# Patient Record
Sex: Male | Born: 1950 | Race: Black or African American | Hispanic: No | Marital: Married | Smoking: Former smoker
Health system: Southern US, Community
[De-identification: ages and names within clinical notes are randomized; demographics above are authoritative.]

## PROBLEM LIST (undated history)

## (undated) ENCOUNTER — Emergency Department (HOSPITAL_COMMUNITY): Discharge status: Absent without leave | Payer: Medicare HMO

## (undated) ENCOUNTER — Emergency Department (HOSPITAL_COMMUNITY): Discharge status: Absent without leave | Payer: Self-pay

## (undated) DIAGNOSIS — R0609 Other forms of dyspnea: Secondary | ICD-10-CM

## (undated) DIAGNOSIS — E785 Hyperlipidemia, unspecified: Secondary | ICD-10-CM

## (undated) DIAGNOSIS — I2510 Atherosclerotic heart disease of native coronary artery without angina pectoris: Secondary | ICD-10-CM

## (undated) DIAGNOSIS — Z951 Presence of aortocoronary bypass graft: Secondary | ICD-10-CM

## (undated) DIAGNOSIS — I10 Essential (primary) hypertension: Secondary | ICD-10-CM

## (undated) HISTORY — DX: Presence of aortocoronary bypass graft: Z95.1

## (undated) HISTORY — DX: Atherosclerotic heart disease of native coronary artery without angina pectoris: I25.10

## (undated) HISTORY — DX: Other forms of dyspnea: R06.09

---

## 2007-05-01 ENCOUNTER — Emergency Department (HOSPITAL_COMMUNITY): Admission: EM | Admit: 2007-05-01 | Discharge: 2007-05-01 | Payer: Self-pay | Admitting: Emergency Medicine

## 2007-08-29 ENCOUNTER — Emergency Department (HOSPITAL_COMMUNITY): Admission: EM | Admit: 2007-08-29 | Discharge: 2007-08-29 | Payer: Self-pay | Admitting: *Deleted

## 2007-09-05 ENCOUNTER — Emergency Department (HOSPITAL_COMMUNITY): Admission: EM | Admit: 2007-09-05 | Discharge: 2007-09-05 | Payer: Self-pay | Admitting: Emergency Medicine

## 2008-03-16 ENCOUNTER — Ambulatory Visit (HOSPITAL_COMMUNITY): Admission: RE | Admit: 2008-03-16 | Discharge: 2008-03-16 | Payer: Self-pay | Admitting: Family Medicine

## 2008-03-22 ENCOUNTER — Ambulatory Visit (HOSPITAL_COMMUNITY): Admission: RE | Admit: 2008-03-22 | Discharge: 2008-03-22 | Payer: Self-pay | Admitting: Family Medicine

## 2014-06-19 ENCOUNTER — Ambulatory Visit (INDEPENDENT_AMBULATORY_CARE_PROVIDER_SITE_OTHER): Payer: Self-pay | Admitting: Urology

## 2014-06-19 ENCOUNTER — Other Ambulatory Visit: Payer: Self-pay | Admitting: Urology

## 2014-06-19 DIAGNOSIS — R3129 Other microscopic hematuria: Secondary | ICD-10-CM

## 2014-06-19 DIAGNOSIS — R319 Hematuria, unspecified: Secondary | ICD-10-CM

## 2014-06-19 DIAGNOSIS — K4090 Unilateral inguinal hernia, without obstruction or gangrene, not specified as recurrent: Secondary | ICD-10-CM

## 2014-07-09 ENCOUNTER — Ambulatory Visit (HOSPITAL_COMMUNITY)
Admission: RE | Admit: 2014-07-09 | Discharge: 2014-07-09 | Disposition: A | Discharge status: Home / Self Care | Payer: Self-pay | Source: Ambulatory Visit | Attending: Urology | Admitting: Urology

## 2014-07-09 DIAGNOSIS — I700 Atherosclerosis of aorta: Secondary | ICD-10-CM | POA: Insufficient documentation

## 2014-07-09 DIAGNOSIS — K4090 Unilateral inguinal hernia, without obstruction or gangrene, not specified as recurrent: Secondary | ICD-10-CM | POA: Insufficient documentation

## 2014-07-09 DIAGNOSIS — R319 Hematuria, unspecified: Secondary | ICD-10-CM | POA: Insufficient documentation

## 2014-07-09 DIAGNOSIS — K439 Ventral hernia without obstruction or gangrene: Secondary | ICD-10-CM | POA: Insufficient documentation

## 2014-07-09 DIAGNOSIS — N400 Enlarged prostate without lower urinary tract symptoms: Secondary | ICD-10-CM | POA: Insufficient documentation

## 2014-07-09 DIAGNOSIS — K7689 Other specified diseases of liver: Secondary | ICD-10-CM | POA: Insufficient documentation

## 2014-07-09 MED ORDER — SODIUM CHLORIDE 0.9 % IV SOLN
INTRAVENOUS | Status: AC
  Filled 2014-07-09: qty 250

## 2014-07-09 MED ORDER — IOHEXOL 300 MG/ML  SOLN
125.0000 mL | Freq: Once | INTRAMUSCULAR | Status: AC | PRN
  Administered 2014-07-09: 125 mL via INTRAVENOUS

## 2014-07-24 ENCOUNTER — Ambulatory Visit (INDEPENDENT_AMBULATORY_CARE_PROVIDER_SITE_OTHER): Payer: Self-pay | Admitting: Urology

## 2014-07-24 DIAGNOSIS — R3129 Other microscopic hematuria: Secondary | ICD-10-CM

## 2018-03-01 DIAGNOSIS — Z1389 Encounter for screening for other disorder: Secondary | ICD-10-CM | POA: Diagnosis not present

## 2018-03-01 DIAGNOSIS — E782 Mixed hyperlipidemia: Secondary | ICD-10-CM | POA: Diagnosis not present

## 2018-03-01 DIAGNOSIS — Z6828 Body mass index (BMI) 28.0-28.9, adult: Secondary | ICD-10-CM | POA: Diagnosis not present

## 2018-03-01 DIAGNOSIS — E663 Overweight: Secondary | ICD-10-CM | POA: Diagnosis not present

## 2018-03-01 DIAGNOSIS — Z125 Encounter for screening for malignant neoplasm of prostate: Secondary | ICD-10-CM | POA: Diagnosis not present

## 2018-03-01 DIAGNOSIS — Z9119 Patient's noncompliance with other medical treatment and regimen: Secondary | ICD-10-CM | POA: Diagnosis not present

## 2018-03-01 DIAGNOSIS — I10 Essential (primary) hypertension: Secondary | ICD-10-CM | POA: Diagnosis not present

## 2018-03-08 DIAGNOSIS — Z719 Counseling, unspecified: Secondary | ICD-10-CM | POA: Diagnosis not present

## 2018-03-08 DIAGNOSIS — N183 Chronic kidney disease, stage 3 (moderate): Secondary | ICD-10-CM | POA: Diagnosis not present

## 2018-03-08 DIAGNOSIS — R7309 Other abnormal glucose: Secondary | ICD-10-CM | POA: Diagnosis not present

## 2018-03-08 DIAGNOSIS — Z6828 Body mass index (BMI) 28.0-28.9, adult: Secondary | ICD-10-CM | POA: Diagnosis not present

## 2018-03-08 DIAGNOSIS — E663 Overweight: Secondary | ICD-10-CM | POA: Diagnosis not present

## 2018-03-08 DIAGNOSIS — I10 Essential (primary) hypertension: Secondary | ICD-10-CM | POA: Diagnosis not present

## 2018-03-16 ENCOUNTER — Other Ambulatory Visit (HOSPITAL_COMMUNITY): Payer: Self-pay | Admitting: Family Medicine

## 2018-03-16 DIAGNOSIS — F17210 Nicotine dependence, cigarettes, uncomplicated: Secondary | ICD-10-CM

## 2018-03-17 DIAGNOSIS — F17210 Nicotine dependence, cigarettes, uncomplicated: Secondary | ICD-10-CM | POA: Diagnosis not present

## 2018-03-17 DIAGNOSIS — I700 Atherosclerosis of aorta: Secondary | ICD-10-CM | POA: Diagnosis not present

## 2018-03-17 DIAGNOSIS — I77811 Abdominal aortic ectasia: Secondary | ICD-10-CM | POA: Diagnosis not present

## 2018-03-17 DIAGNOSIS — Z136 Encounter for screening for cardiovascular disorders: Secondary | ICD-10-CM | POA: Diagnosis not present

## 2018-03-17 DIAGNOSIS — Z87891 Personal history of nicotine dependence: Secondary | ICD-10-CM | POA: Diagnosis not present

## 2018-03-17 DIAGNOSIS — Z1389 Encounter for screening for other disorder: Secondary | ICD-10-CM | POA: Diagnosis not present

## 2018-03-17 DIAGNOSIS — I10 Essential (primary) hypertension: Secondary | ICD-10-CM | POA: Diagnosis not present

## 2018-03-17 DIAGNOSIS — E782 Mixed hyperlipidemia: Secondary | ICD-10-CM | POA: Diagnosis not present

## 2018-03-17 DIAGNOSIS — Z9119 Patient's noncompliance with other medical treatment and regimen: Secondary | ICD-10-CM | POA: Diagnosis not present

## 2018-04-04 ENCOUNTER — Ambulatory Visit (HOSPITAL_COMMUNITY)
Admission: RE | Admit: 2018-04-04 | Discharge: 2018-04-04 | Disposition: A | Discharge status: Home / Self Care | Payer: Medicare Other | Source: Ambulatory Visit | Attending: Family Medicine | Admitting: Family Medicine

## 2018-04-04 DIAGNOSIS — F17210 Nicotine dependence, cigarettes, uncomplicated: Secondary | ICD-10-CM

## 2018-04-04 DIAGNOSIS — I700 Atherosclerosis of aorta: Secondary | ICD-10-CM | POA: Diagnosis not present

## 2018-04-04 DIAGNOSIS — I2510 Atherosclerotic heart disease of native coronary artery without angina pectoris: Secondary | ICD-10-CM | POA: Insufficient documentation

## 2018-04-04 DIAGNOSIS — Z122 Encounter for screening for malignant neoplasm of respiratory organs: Secondary | ICD-10-CM | POA: Insufficient documentation

## 2018-04-04 DIAGNOSIS — J432 Centrilobular emphysema: Secondary | ICD-10-CM | POA: Diagnosis not present

## 2018-04-04 DIAGNOSIS — F17200 Nicotine dependence, unspecified, uncomplicated: Secondary | ICD-10-CM | POA: Diagnosis not present

## 2018-05-30 DIAGNOSIS — E663 Overweight: Secondary | ICD-10-CM | POA: Diagnosis not present

## 2018-05-30 DIAGNOSIS — I10 Essential (primary) hypertension: Secondary | ICD-10-CM | POA: Diagnosis not present

## 2018-05-30 DIAGNOSIS — Z6828 Body mass index (BMI) 28.0-28.9, adult: Secondary | ICD-10-CM | POA: Diagnosis not present

## 2018-06-02 DIAGNOSIS — Z1211 Encounter for screening for malignant neoplasm of colon: Secondary | ICD-10-CM | POA: Diagnosis not present

## 2018-08-19 DIAGNOSIS — I10 Essential (primary) hypertension: Secondary | ICD-10-CM | POA: Diagnosis not present

## 2018-08-19 DIAGNOSIS — Z1389 Encounter for screening for other disorder: Secondary | ICD-10-CM | POA: Diagnosis not present

## 2018-08-19 DIAGNOSIS — E782 Mixed hyperlipidemia: Secondary | ICD-10-CM | POA: Diagnosis not present

## 2018-08-19 DIAGNOSIS — R7309 Other abnormal glucose: Secondary | ICD-10-CM | POA: Diagnosis not present

## 2018-08-19 DIAGNOSIS — Z0001 Encounter for general adult medical examination with abnormal findings: Secondary | ICD-10-CM | POA: Diagnosis not present

## 2018-08-19 DIAGNOSIS — E663 Overweight: Secondary | ICD-10-CM | POA: Diagnosis not present

## 2018-08-19 DIAGNOSIS — Z6827 Body mass index (BMI) 27.0-27.9, adult: Secondary | ICD-10-CM | POA: Diagnosis not present

## 2018-10-25 DIAGNOSIS — N182 Chronic kidney disease, stage 2 (mild): Secondary | ICD-10-CM | POA: Diagnosis not present

## 2018-10-25 DIAGNOSIS — Z719 Counseling, unspecified: Secondary | ICD-10-CM | POA: Diagnosis not present

## 2018-10-25 DIAGNOSIS — E663 Overweight: Secondary | ICD-10-CM | POA: Diagnosis not present

## 2018-10-25 DIAGNOSIS — E782 Mixed hyperlipidemia: Secondary | ICD-10-CM | POA: Diagnosis not present

## 2018-10-25 DIAGNOSIS — Z6828 Body mass index (BMI) 28.0-28.9, adult: Secondary | ICD-10-CM | POA: Diagnosis not present

## 2019-03-01 DIAGNOSIS — E7849 Other hyperlipidemia: Secondary | ICD-10-CM | POA: Diagnosis not present

## 2019-03-01 DIAGNOSIS — E663 Overweight: Secondary | ICD-10-CM | POA: Diagnosis not present

## 2019-03-01 DIAGNOSIS — Z1389 Encounter for screening for other disorder: Secondary | ICD-10-CM | POA: Diagnosis not present

## 2019-03-01 DIAGNOSIS — I10 Essential (primary) hypertension: Secondary | ICD-10-CM | POA: Diagnosis not present

## 2019-03-01 DIAGNOSIS — R7309 Other abnormal glucose: Secondary | ICD-10-CM | POA: Diagnosis not present

## 2019-03-01 DIAGNOSIS — Z6829 Body mass index (BMI) 29.0-29.9, adult: Secondary | ICD-10-CM | POA: Diagnosis not present

## 2019-03-02 DIAGNOSIS — E7849 Other hyperlipidemia: Secondary | ICD-10-CM | POA: Diagnosis not present

## 2019-03-02 DIAGNOSIS — I10 Essential (primary) hypertension: Secondary | ICD-10-CM | POA: Diagnosis not present

## 2019-03-02 DIAGNOSIS — Z1389 Encounter for screening for other disorder: Secondary | ICD-10-CM | POA: Diagnosis not present

## 2019-03-02 DIAGNOSIS — N182 Chronic kidney disease, stage 2 (mild): Secondary | ICD-10-CM | POA: Diagnosis not present

## 2019-03-02 DIAGNOSIS — Z125 Encounter for screening for malignant neoplasm of prostate: Secondary | ICD-10-CM | POA: Diagnosis not present

## 2019-06-07 DIAGNOSIS — I10 Essential (primary) hypertension: Secondary | ICD-10-CM | POA: Diagnosis not present

## 2019-06-07 DIAGNOSIS — E7849 Other hyperlipidemia: Secondary | ICD-10-CM | POA: Diagnosis not present

## 2019-06-07 DIAGNOSIS — R7309 Other abnormal glucose: Secondary | ICD-10-CM | POA: Diagnosis not present

## 2019-06-07 DIAGNOSIS — Z6828 Body mass index (BMI) 28.0-28.9, adult: Secondary | ICD-10-CM | POA: Diagnosis not present

## 2019-06-07 DIAGNOSIS — Z1389 Encounter for screening for other disorder: Secondary | ICD-10-CM | POA: Diagnosis not present

## 2019-06-10 DIAGNOSIS — Z1211 Encounter for screening for malignant neoplasm of colon: Secondary | ICD-10-CM | POA: Diagnosis not present

## 2019-06-12 ENCOUNTER — Other Ambulatory Visit (HOSPITAL_COMMUNITY): Payer: Self-pay | Admitting: Family Medicine

## 2019-06-12 DIAGNOSIS — R911 Solitary pulmonary nodule: Secondary | ICD-10-CM

## 2019-06-29 ENCOUNTER — Ambulatory Visit (HOSPITAL_COMMUNITY): Payer: Medicare HMO

## 2019-07-06 ENCOUNTER — Ambulatory Visit (HOSPITAL_COMMUNITY)
Admission: RE | Admit: 2019-07-06 | Discharge: 2019-07-06 | Disposition: A | Discharge status: Home / Self Care | Payer: Medicare HMO | Source: Ambulatory Visit | Attending: Family Medicine | Admitting: Family Medicine

## 2019-07-06 ENCOUNTER — Other Ambulatory Visit: Payer: Self-pay

## 2019-07-06 DIAGNOSIS — R918 Other nonspecific abnormal finding of lung field: Secondary | ICD-10-CM | POA: Diagnosis not present

## 2019-07-06 DIAGNOSIS — J432 Centrilobular emphysema: Secondary | ICD-10-CM | POA: Diagnosis not present

## 2019-07-06 DIAGNOSIS — R911 Solitary pulmonary nodule: Secondary | ICD-10-CM

## 2019-07-06 DIAGNOSIS — J449 Chronic obstructive pulmonary disease, unspecified: Secondary | ICD-10-CM | POA: Diagnosis not present

## 2019-09-22 DIAGNOSIS — I10 Essential (primary) hypertension: Secondary | ICD-10-CM | POA: Diagnosis not present

## 2019-09-22 DIAGNOSIS — K4090 Unilateral inguinal hernia, without obstruction or gangrene, not specified as recurrent: Secondary | ICD-10-CM | POA: Diagnosis not present

## 2019-09-22 DIAGNOSIS — E663 Overweight: Secondary | ICD-10-CM | POA: Diagnosis not present

## 2019-09-22 DIAGNOSIS — E7849 Other hyperlipidemia: Secondary | ICD-10-CM | POA: Diagnosis not present

## 2019-09-22 DIAGNOSIS — Z6827 Body mass index (BMI) 27.0-27.9, adult: Secondary | ICD-10-CM | POA: Diagnosis not present

## 2019-09-22 DIAGNOSIS — Z1389 Encounter for screening for other disorder: Secondary | ICD-10-CM | POA: Diagnosis not present

## 2019-09-22 DIAGNOSIS — Z0000 Encounter for general adult medical examination without abnormal findings: Secondary | ICD-10-CM | POA: Diagnosis not present

## 2019-09-25 ENCOUNTER — Other Ambulatory Visit: Payer: Self-pay

## 2019-09-25 DIAGNOSIS — N183 Chronic kidney disease, stage 3 unspecified: Secondary | ICD-10-CM | POA: Insufficient documentation

## 2019-09-25 DIAGNOSIS — R7309 Other abnormal glucose: Secondary | ICD-10-CM | POA: Insufficient documentation

## 2019-09-25 DIAGNOSIS — E7849 Other hyperlipidemia: Secondary | ICD-10-CM | POA: Insufficient documentation

## 2019-09-25 DIAGNOSIS — E782 Mixed hyperlipidemia: Secondary | ICD-10-CM | POA: Insufficient documentation

## 2019-09-25 DIAGNOSIS — I10 Essential (primary) hypertension: Secondary | ICD-10-CM | POA: Insufficient documentation

## 2019-10-26 ENCOUNTER — Ambulatory Visit: Payer: Medicare HMO | Admitting: General Surgery

## 2019-10-26 ENCOUNTER — Encounter: Payer: Self-pay | Admitting: General Surgery

## 2019-10-26 ENCOUNTER — Other Ambulatory Visit: Payer: Self-pay

## 2019-10-26 VITALS — BP 148/94 | HR 90 | Temp 97.4°F | Resp 16 | Ht 72.0 in | Wt 195.0 lb

## 2019-10-26 DIAGNOSIS — K4091 Unilateral inguinal hernia, without obstruction or gangrene, recurrent: Secondary | ICD-10-CM

## 2019-10-26 DIAGNOSIS — K4090 Unilateral inguinal hernia, without obstruction or gangrene, not specified as recurrent: Secondary | ICD-10-CM

## 2019-10-26 NOTE — Progress Notes (Signed)
Dean Mendez; [id number]; 1951-04-05   HPI Patient is a 68 year old black male who was referred to my care by Dr. Hilma Favors for evaluation and treatment of a left inguinal hernia.  Patient states he had the hernia fixed at the age of 25.  He has had over the last 2 decades increasing swelling in the left groin region.  Is made worse with coughing.  Over the last 9 years, he states he has had significant swelling in the left inguinal region.  Is starting to become more uncomfortable.  He denies any nausea or vomiting.  He currently has 0 out of 10 abdominal pain. History reviewed. No pertinent past medical history.  History reviewed. No pertinent surgical history.  History reviewed. No pertinent family history.  Current Outpatient Medications on File Prior to Visit  Medication Sig Dispense Refill  . CARTIA XT 300 MG 24 hr capsule Take 300 mg by mouth daily.    . chlorhexidine (PERIDEX) 0.12 % solution 15 mLs 2 (two) times daily.    Marland Kitchen HYDROcodone-acetaminophen (NORCO/VICODIN) 5-325 MG tablet Take 1 tablet by mouth every 4 (four) hours as needed.    Marland Kitchen ibuprofen (ADVIL) 400 MG tablet Take 400 mg by mouth every 6 (six) hours as needed.    Marland Kitchen lisinopril-hydrochlorothiazide (ZESTORETIC) 20-12.5 MG tablet Take 1 tablet by mouth daily.    . pravastatin (PRAVACHOL) 20 MG tablet Take 20 mg by mouth daily.     No current facility-administered medications on file prior to visit.     Not on File  Social History   Substance and Sexual Activity  Alcohol Use Not Currently    Social History   Tobacco Use  Smoking Status Current Every Day Smoker  . Packs/day: 0.25  . Types: Cigarettes  Smokeless Tobacco Never Used    Review of Systems  Constitutional: Negative.   HENT: Negative.   Eyes: Negative.   Respiratory: Negative.   Cardiovascular: Negative.   Gastrointestinal: Negative.   Genitourinary: Negative.   Musculoskeletal: Negative.   Skin: Negative.   Neurological: Negative.    Endo/Heme/Allergies: Negative.   Psychiatric/Behavioral: Negative.     Objective   Vitals:   10/26/19 0920  BP: (!) 148/94  Pulse: 90  Resp: 16  Temp: (!) 97.4 F (36.3 C)  SpO2: 96%    Physical Exam Vitals signs reviewed.  Constitutional:      Appearance: Normal appearance. He is normal weight. He is not ill-appearing.  HENT:     Head: Normocephalic and atraumatic.  Cardiovascular:     Rate and Rhythm: Normal rate and regular rhythm.     Heart sounds: Normal heart sounds. No murmur. No friction rub. No gallop.   Pulmonary:     Effort: Pulmonary effort is normal. No respiratory distress.     Breath sounds: Normal breath sounds. No stridor. No wheezing, rhonchi or rales.  Abdominal:     General: Abdomen is flat. There is no distension.     Palpations: Abdomen is soft. There is no mass.     Tenderness: There is no abdominal tenderness. There is no guarding or rebound.     Hernia: A hernia is present.     Comments: Very large left inguinal hernia with intestinal contents within the scrotum.  Smaller weak right inguinal floor.  Genitourinary:    Penis: Normal.      Scrotum/Testes: Normal.  Neurological:     General: No focal deficit present.     Mental Status: He is alert and oriented to  person, place, and time.     Assessment  Recurrent left inguinal hernia Plan   Patient will call to schedule recurrent left inguinal herniorrhaphy with mesh.  The risks and benefits of the procedure including bleeding, infection, mesh use, and the possibility of recurrence of the hernia were fully explained to the patient, who gave informed consent.

## 2019-10-26 NOTE — Patient Instructions (Signed)
Inguinal Hernia, Adult °An inguinal hernia develops when fat or the intestines push through a weak spot in a muscle where your leg meets your lower abdomen (groin). This creates a bulge. This kind of hernia could also be: °· In your scrotum, if you are male. °· In folds of skin around your vagina, if you are male. °There are three types of inguinal hernias: °· Hernias that can be pushed back into the abdomen (are reducible). This type rarely causes pain. °· Hernias that are not reducible (are incarcerated). °· Hernias that are not reducible and lose their blood supply (are strangulated). This type of hernia requires emergency surgery. °What are the causes? °This condition is caused by having a weak spot in the muscles or tissues in the groin. This weak spot develops over time. The hernia may poke through the weak spot when you suddenly strain your lower abdominal muscles, such as when you: °· Lift a heavy object. °· Strain to have a bowel movement. Constipation can lead to straining. °· Cough. °What increases the risk? °This condition is more likely to develop in: °· Men. °· Pregnant women. °· People who: °? Are overweight. °? Work in jobs that require long periods of standing or heavy lifting. °? Have had an inguinal hernia before. °? Smoke or have lung disease. These factors can lead to long-lasting (chronic) coughing. °What are the signs or symptoms? °Symptoms may depend on the size of the hernia. Often, a small inguinal hernia has no symptoms. Symptoms of a larger hernia may include: °· A lump in the groin area. This is easier to see when standing. It might not be visible when lying down. °· Pain or burning in the groin. This may get worse when lifting, straining, or coughing. °· A dull ache or a feeling of pressure in the groin. °· In men, an unusual lump in the scrotum. °Symptoms of a strangulated inguinal hernia may include: °· A bulge in your groin that is very painful and tender to the touch. °· A bulge  that turns red or purple. °· Fever, nausea, and vomiting. °· Inability to have a bowel movement or to pass gas. °How is this diagnosed? °This condition is diagnosed based on your symptoms, your medical history, and a physical exam. Your health care provider may feel your groin area and ask you to cough. °How is this treated? °Treatment depends on the size of your hernia and whether you have symptoms. If you do not have symptoms, your health care provider may have you watch your hernia carefully and have you come in for follow-up visits. If your hernia is large or if you have symptoms, you may need surgery to repair the hernia. °Follow these instructions at home: °Lifestyle °· Avoid lifting heavy objects. °· Avoid standing for long periods of time. °· Do not use any products that contain nicotine or tobacco, such as cigarettes and e-cigarettes. If you need help quitting, ask your health care provider. °· Maintain a healthy weight. °Preventing constipation °· Take actions to prevent constipation. Constipation leads to straining with bowel movements, which can make a hernia worse or cause a hernia repair to break down. Your health care provider may recommend that you: °? Drink enough fluid to keep your urine pale yellow. °? Eat foods that are high in fiber, such as fresh fruits and vegetables, whole grains, and beans. °? Limit foods that are high in fat and processed sugars, such as fried or sweet foods. °? Take an over-the-counter   or prescription medicine for constipation. °General instructions °· You may try to push the hernia back in place by very gently pressing on it while lying down. Do not try to force the bulge back in if it will not push in easily. °· Watch your hernia for any changes in shape, size, or color. Get help right away if you notice any changes. °· Take over-the-counter and prescription medicines only as told by your health care provider. °· Keep all follow-up visits as told by your health care  provider. This is important. °Contact a health care provider if: °· You have a fever. °· You develop new symptoms. °· Your symptoms get worse. °Get help right away if: °· You have pain in your groin that suddenly gets worse. °· You have a bulge in your groin that: °? Suddenly gets bigger and does not get smaller. °? Becomes red or purple or painful to the touch. °· You are a man and you have a sudden pain in your scrotum, or the size of your scrotum suddenly changes. °· You cannot push the hernia back in place by very gently pressing on it when you are lying down. Do not try to force the bulge back in if it will not push in easily. °· You have nausea or vomiting that does not go away. °· You have a fast heartbeat. °· You cannot have a bowel movement or pass gas. °These symptoms may represent a serious problem that is an emergency. Do not wait to see if the symptoms will go away. Get medical help right away. Call your local emergency services (911 in the [place] °Summary °· An inguinal hernia develops when fat or the intestines push through a weak spot in a muscle where your leg meets your lower abdomen (groin). °· This condition is caused by having a weak spot in muscles or tissue in your groin. °· Symptoms may depend on the size of the hernia, and they may include pain or swelling in your groin. A small inguinal hernia often has no symptoms. °· Treatment may not be needed if you do not have symptoms. If you have symptoms or a large hernia, you may need surgery to repair the hernia. °· Avoid lifting heavy objects. Also avoid standing for long amounts of time. °This information is not intended to replace advice given to you by your health care provider. Make sure you discuss any questions you have with your health care provider. °Document Released: 04/25/2009 Document Revised: 01/08/2018 Document Reviewed: 09/08/2017 °Elsevier Patient Education © 2020 [employer] ° °

## 2019-11-07 ENCOUNTER — Other Ambulatory Visit: Payer: Self-pay | Admitting: *Deleted

## 2019-11-07 DIAGNOSIS — Z20822 Contact with and (suspected) exposure to covid-19: Secondary | ICD-10-CM

## 2019-11-09 LAB — NOVEL CORONAVIRUS, NAA: SARS-CoV-2, NAA: NOT DETECTED

## 2019-11-10 ENCOUNTER — Telehealth: Payer: Self-pay

## 2019-11-10 NOTE — Telephone Encounter (Signed)
Patient given negative result and verbalized understanding  

## 2020-04-03 DIAGNOSIS — I951 Orthostatic hypotension: Secondary | ICD-10-CM | POA: Diagnosis not present

## 2020-04-03 DIAGNOSIS — I10 Essential (primary) hypertension: Secondary | ICD-10-CM | POA: Diagnosis not present

## 2020-04-03 DIAGNOSIS — Z7982 Long term (current) use of aspirin: Secondary | ICD-10-CM | POA: Diagnosis not present

## 2020-04-03 DIAGNOSIS — E785 Hyperlipidemia, unspecified: Secondary | ICD-10-CM | POA: Diagnosis not present

## 2020-04-03 DIAGNOSIS — R69 Illness, unspecified: Secondary | ICD-10-CM | POA: Diagnosis not present

## 2020-04-03 DIAGNOSIS — Z008 Encounter for other general examination: Secondary | ICD-10-CM | POA: Diagnosis not present

## 2020-04-03 DIAGNOSIS — Z8249 Family history of ischemic heart disease and other diseases of the circulatory system: Secondary | ICD-10-CM | POA: Diagnosis not present

## 2020-04-03 DIAGNOSIS — Z833 Family history of diabetes mellitus: Secondary | ICD-10-CM | POA: Diagnosis not present

## 2020-04-03 DIAGNOSIS — M1990 Unspecified osteoarthritis, unspecified site: Secondary | ICD-10-CM | POA: Diagnosis not present

## 2020-04-03 DIAGNOSIS — I739 Peripheral vascular disease, unspecified: Secondary | ICD-10-CM | POA: Diagnosis not present

## 2020-08-06 DIAGNOSIS — Z6827 Body mass index (BMI) 27.0-27.9, adult: Secondary | ICD-10-CM | POA: Diagnosis not present

## 2020-08-06 DIAGNOSIS — M5030 Other cervical disc degeneration, unspecified cervical region: Secondary | ICD-10-CM | POA: Diagnosis not present

## 2020-08-06 DIAGNOSIS — M542 Cervicalgia: Secondary | ICD-10-CM | POA: Diagnosis not present

## 2020-08-06 DIAGNOSIS — E663 Overweight: Secondary | ICD-10-CM | POA: Diagnosis not present

## 2020-08-06 DIAGNOSIS — Z1389 Encounter for screening for other disorder: Secondary | ICD-10-CM | POA: Diagnosis not present

## 2020-08-20 DIAGNOSIS — M79674 Pain in right toe(s): Secondary | ICD-10-CM | POA: Diagnosis not present

## 2020-08-20 DIAGNOSIS — M79675 Pain in left toe(s): Secondary | ICD-10-CM | POA: Diagnosis not present

## 2020-08-20 DIAGNOSIS — B351 Tinea unguium: Secondary | ICD-10-CM | POA: Diagnosis not present

## 2020-09-30 DIAGNOSIS — Z1389 Encounter for screening for other disorder: Secondary | ICD-10-CM | POA: Diagnosis not present

## 2020-09-30 DIAGNOSIS — I10 Essential (primary) hypertension: Secondary | ICD-10-CM | POA: Diagnosis not present

## 2020-09-30 DIAGNOSIS — N183 Chronic kidney disease, stage 3 unspecified: Secondary | ICD-10-CM | POA: Diagnosis not present

## 2020-09-30 DIAGNOSIS — Z6827 Body mass index (BMI) 27.0-27.9, adult: Secondary | ICD-10-CM | POA: Diagnosis not present

## 2020-09-30 DIAGNOSIS — M5030 Other cervical disc degeneration, unspecified cervical region: Secondary | ICD-10-CM | POA: Diagnosis not present

## 2020-09-30 DIAGNOSIS — Z1331 Encounter for screening for depression: Secondary | ICD-10-CM | POA: Diagnosis not present

## 2020-09-30 DIAGNOSIS — E7849 Other hyperlipidemia: Secondary | ICD-10-CM | POA: Diagnosis not present

## 2020-09-30 DIAGNOSIS — Z0001 Encounter for general adult medical examination with abnormal findings: Secondary | ICD-10-CM | POA: Diagnosis not present

## 2020-09-30 DIAGNOSIS — Z125 Encounter for screening for malignant neoplasm of prostate: Secondary | ICD-10-CM | POA: Diagnosis not present

## 2020-09-30 DIAGNOSIS — R7309 Other abnormal glucose: Secondary | ICD-10-CM | POA: Diagnosis not present

## 2020-10-29 DIAGNOSIS — M79675 Pain in left toe(s): Secondary | ICD-10-CM | POA: Diagnosis not present

## 2020-10-29 DIAGNOSIS — B351 Tinea unguium: Secondary | ICD-10-CM | POA: Diagnosis not present

## 2020-10-29 DIAGNOSIS — M79674 Pain in right toe(s): Secondary | ICD-10-CM | POA: Diagnosis not present

## 2021-01-24 DIAGNOSIS — M79674 Pain in right toe(s): Secondary | ICD-10-CM | POA: Diagnosis not present

## 2021-01-24 DIAGNOSIS — M79675 Pain in left toe(s): Secondary | ICD-10-CM | POA: Diagnosis not present

## 2021-01-24 DIAGNOSIS — B351 Tinea unguium: Secondary | ICD-10-CM | POA: Diagnosis not present

## 2021-02-10 DIAGNOSIS — I951 Orthostatic hypotension: Secondary | ICD-10-CM | POA: Diagnosis not present

## 2021-02-10 DIAGNOSIS — Z833 Family history of diabetes mellitus: Secondary | ICD-10-CM | POA: Diagnosis not present

## 2021-02-10 DIAGNOSIS — I10 Essential (primary) hypertension: Secondary | ICD-10-CM | POA: Diagnosis not present

## 2021-02-10 DIAGNOSIS — E663 Overweight: Secondary | ICD-10-CM | POA: Diagnosis not present

## 2021-02-10 DIAGNOSIS — Z7982 Long term (current) use of aspirin: Secondary | ICD-10-CM | POA: Diagnosis not present

## 2021-02-10 DIAGNOSIS — E785 Hyperlipidemia, unspecified: Secondary | ICD-10-CM | POA: Diagnosis not present

## 2021-02-10 DIAGNOSIS — R69 Illness, unspecified: Secondary | ICD-10-CM | POA: Diagnosis not present

## 2021-02-10 DIAGNOSIS — I4891 Unspecified atrial fibrillation: Secondary | ICD-10-CM | POA: Diagnosis not present

## 2021-02-10 DIAGNOSIS — Z6825 Body mass index (BMI) 25.0-25.9, adult: Secondary | ICD-10-CM | POA: Diagnosis not present

## 2021-04-04 DIAGNOSIS — M79675 Pain in left toe(s): Secondary | ICD-10-CM | POA: Diagnosis not present

## 2021-04-04 DIAGNOSIS — M79674 Pain in right toe(s): Secondary | ICD-10-CM | POA: Diagnosis not present

## 2021-04-04 DIAGNOSIS — B351 Tinea unguium: Secondary | ICD-10-CM | POA: Diagnosis not present

## 2021-06-13 DIAGNOSIS — B351 Tinea unguium: Secondary | ICD-10-CM | POA: Diagnosis not present

## 2021-06-13 DIAGNOSIS — M79674 Pain in right toe(s): Secondary | ICD-10-CM | POA: Diagnosis not present

## 2021-06-13 DIAGNOSIS — M79675 Pain in left toe(s): Secondary | ICD-10-CM | POA: Diagnosis not present

## 2021-08-27 DIAGNOSIS — M79675 Pain in left toe(s): Secondary | ICD-10-CM | POA: Diagnosis not present

## 2021-08-27 DIAGNOSIS — B351 Tinea unguium: Secondary | ICD-10-CM | POA: Diagnosis not present

## 2021-08-27 DIAGNOSIS — M79674 Pain in right toe(s): Secondary | ICD-10-CM | POA: Diagnosis not present

## 2021-10-01 DIAGNOSIS — E663 Overweight: Secondary | ICD-10-CM | POA: Diagnosis not present

## 2021-10-01 DIAGNOSIS — M5030 Other cervical disc degeneration, unspecified cervical region: Secondary | ICD-10-CM | POA: Diagnosis not present

## 2021-10-01 DIAGNOSIS — E7849 Other hyperlipidemia: Secondary | ICD-10-CM | POA: Diagnosis not present

## 2021-10-01 DIAGNOSIS — E782 Mixed hyperlipidemia: Secondary | ICD-10-CM | POA: Diagnosis not present

## 2021-10-01 DIAGNOSIS — Z0001 Encounter for general adult medical examination with abnormal findings: Secondary | ICD-10-CM | POA: Diagnosis not present

## 2021-10-01 DIAGNOSIS — Z1331 Encounter for screening for depression: Secondary | ICD-10-CM | POA: Diagnosis not present

## 2021-10-01 DIAGNOSIS — Z125 Encounter for screening for malignant neoplasm of prostate: Secondary | ICD-10-CM | POA: Diagnosis not present

## 2021-10-01 DIAGNOSIS — Z6826 Body mass index (BMI) 26.0-26.9, adult: Secondary | ICD-10-CM | POA: Diagnosis not present

## 2021-10-01 DIAGNOSIS — I10 Essential (primary) hypertension: Secondary | ICD-10-CM | POA: Diagnosis not present

## 2021-10-01 DIAGNOSIS — R7309 Other abnormal glucose: Secondary | ICD-10-CM | POA: Diagnosis not present

## 2021-11-11 ENCOUNTER — Inpatient Hospital Stay (HOSPITAL_COMMUNITY)
Admission: EM | Admit: 2021-11-11 | Discharge: 2021-11-22 | DRG: 215 | Disposition: A | Discharge status: Home Health | Payer: Medicare HMO | Attending: Thoracic Surgery (Cardiothoracic Vascular Surgery) | Admitting: Thoracic Surgery (Cardiothoracic Vascular Surgery)

## 2021-11-11 ENCOUNTER — Emergency Department (HOSPITAL_COMMUNITY): Payer: Medicare HMO

## 2021-11-11 ENCOUNTER — Other Ambulatory Visit: Payer: Self-pay

## 2021-11-11 ENCOUNTER — Encounter (HOSPITAL_COMMUNITY): Payer: Self-pay | Admitting: *Deleted

## 2021-11-11 DIAGNOSIS — I10 Essential (primary) hypertension: Secondary | ICD-10-CM

## 2021-11-11 DIAGNOSIS — I2510 Atherosclerotic heart disease of native coronary artery without angina pectoris: Secondary | ICD-10-CM | POA: Diagnosis present

## 2021-11-11 DIAGNOSIS — I214 Non-ST elevation (NSTEMI) myocardial infarction: Secondary | ICD-10-CM | POA: Diagnosis not present

## 2021-11-11 DIAGNOSIS — N183 Chronic kidney disease, stage 3 unspecified: Secondary | ICD-10-CM | POA: Diagnosis present

## 2021-11-11 DIAGNOSIS — M79675 Pain in left toe(s): Secondary | ICD-10-CM | POA: Diagnosis not present

## 2021-11-11 DIAGNOSIS — R570 Cardiogenic shock: Secondary | ICD-10-CM | POA: Diagnosis present

## 2021-11-11 DIAGNOSIS — D62 Acute posthemorrhagic anemia: Secondary | ICD-10-CM | POA: Diagnosis not present

## 2021-11-11 DIAGNOSIS — I129 Hypertensive chronic kidney disease with stage 1 through stage 4 chronic kidney disease, or unspecified chronic kidney disease: Secondary | ICD-10-CM | POA: Diagnosis present

## 2021-11-11 DIAGNOSIS — J942 Hemothorax: Secondary | ICD-10-CM | POA: Diagnosis not present

## 2021-11-11 DIAGNOSIS — Z9889 Other specified postprocedural states: Secondary | ICD-10-CM

## 2021-11-11 DIAGNOSIS — R079 Chest pain, unspecified: Secondary | ICD-10-CM

## 2021-11-11 DIAGNOSIS — D72829 Elevated white blood cell count, unspecified: Secondary | ICD-10-CM | POA: Diagnosis not present

## 2021-11-11 DIAGNOSIS — I252 Old myocardial infarction: Secondary | ICD-10-CM

## 2021-11-11 DIAGNOSIS — R0789 Other chest pain: Secondary | ICD-10-CM | POA: Diagnosis not present

## 2021-11-11 DIAGNOSIS — F17210 Nicotine dependence, cigarettes, uncomplicated: Secondary | ICD-10-CM | POA: Diagnosis present

## 2021-11-11 DIAGNOSIS — J449 Chronic obstructive pulmonary disease, unspecified: Secondary | ICD-10-CM | POA: Diagnosis not present

## 2021-11-11 DIAGNOSIS — E876 Hypokalemia: Secondary | ICD-10-CM | POA: Diagnosis not present

## 2021-11-11 DIAGNOSIS — D6959 Other secondary thrombocytopenia: Secondary | ICD-10-CM | POA: Diagnosis not present

## 2021-11-11 DIAGNOSIS — N179 Acute kidney failure, unspecified: Secondary | ICD-10-CM | POA: Diagnosis not present

## 2021-11-11 DIAGNOSIS — R778 Other specified abnormalities of plasma proteins: Secondary | ICD-10-CM

## 2021-11-11 DIAGNOSIS — Z9689 Presence of other specified functional implants: Secondary | ICD-10-CM

## 2021-11-11 DIAGNOSIS — B351 Tinea unguium: Secondary | ICD-10-CM | POA: Diagnosis not present

## 2021-11-11 DIAGNOSIS — E782 Mixed hyperlipidemia: Secondary | ICD-10-CM | POA: Diagnosis not present

## 2021-11-11 DIAGNOSIS — I2542 Coronary artery dissection: Secondary | ICD-10-CM | POA: Diagnosis present

## 2021-11-11 DIAGNOSIS — I312 Hemopericardium, not elsewhere classified: Secondary | ICD-10-CM | POA: Diagnosis present

## 2021-11-11 DIAGNOSIS — R55 Syncope and collapse: Secondary | ICD-10-CM | POA: Diagnosis not present

## 2021-11-11 DIAGNOSIS — I493 Ventricular premature depolarization: Secondary | ICD-10-CM | POA: Diagnosis present

## 2021-11-11 DIAGNOSIS — J90 Pleural effusion, not elsewhere classified: Secondary | ICD-10-CM | POA: Diagnosis not present

## 2021-11-11 DIAGNOSIS — M79674 Pain in right toe(s): Secondary | ICD-10-CM | POA: Diagnosis not present

## 2021-11-11 DIAGNOSIS — J439 Emphysema, unspecified: Secondary | ICD-10-CM | POA: Diagnosis not present

## 2021-11-11 DIAGNOSIS — Z79899 Other long term (current) drug therapy: Secondary | ICD-10-CM

## 2021-11-11 DIAGNOSIS — J9811 Atelectasis: Secondary | ICD-10-CM | POA: Diagnosis not present

## 2021-11-11 DIAGNOSIS — Z20822 Contact with and (suspected) exposure to covid-19: Secondary | ICD-10-CM | POA: Diagnosis present

## 2021-11-11 DIAGNOSIS — Z09 Encounter for follow-up examination after completed treatment for conditions other than malignant neoplasm: Secondary | ICD-10-CM

## 2021-11-11 DIAGNOSIS — Z951 Presence of aortocoronary bypass graft: Secondary | ICD-10-CM

## 2021-11-11 DIAGNOSIS — Z8249 Family history of ischemic heart disease and other diseases of the circulatory system: Secondary | ICD-10-CM

## 2021-11-11 HISTORY — DX: Essential (primary) hypertension: I10

## 2021-11-11 LAB — BASIC METABOLIC PANEL
Anion gap: 8 (ref 5–15)
BUN: 15 mg/dL (ref 8–23)
CO2: 28 mmol/L (ref 22–32)
Calcium: 9.2 mg/dL (ref 8.9–10.3)
Chloride: 103 mmol/L (ref 98–111)
Creatinine, Ser: 1.34 mg/dL — ABNORMAL HIGH (ref 0.61–1.24)
GFR, Estimated: 58 mL/min — ABNORMAL LOW (ref >60)
Glucose, Bld: 98 mg/dL (ref 70–99)
Potassium: 3.3 mmol/L — ABNORMAL LOW (ref 3.5–5.1)
Sodium: 139 mmol/L (ref 135–145)

## 2021-11-11 LAB — CBC
HCT: 49.8 % (ref 39.0–52.0)
Hemoglobin: 16 g/dL (ref 13.0–17.0)
MCH: 27.6 pg (ref 26.0–34.0)
MCHC: 32.1 g/dL (ref 30.0–36.0)
MCV: 85.9 fL (ref 80.0–100.0)
Platelets: 270 10*3/uL (ref 150–400)
RBC: 5.8 MIL/uL (ref 4.22–5.81)
RDW: 14.3 % (ref 11.5–15.5)
WBC: 9.7 10*3/uL (ref 4.0–10.5)
nRBC: 0 % (ref 0.0–0.2)

## 2021-11-11 LAB — RESP PANEL BY RT-PCR (FLU A&B, COVID) ARPGX2
Influenza A by PCR: NEGATIVE
Influenza B by PCR: NEGATIVE
SARS Coronavirus 2 by RT PCR: NEGATIVE

## 2021-11-11 LAB — TROPONIN I (HIGH SENSITIVITY)
Troponin I (High Sensitivity): 13 ng/L (ref <18)
Troponin I (High Sensitivity): 77 ng/L — ABNORMAL HIGH (ref <18)

## 2021-11-11 MED ORDER — IOHEXOL 350 MG/ML SOLN
100.0000 mL | Freq: Once | INTRAVENOUS | Status: AC | PRN
  Administered 2021-11-11: 75 mL via INTRAVENOUS

## 2021-11-11 MED ORDER — ASPIRIN 81 MG PO CHEW
324.0000 mg | CHEWABLE_TABLET | Freq: Once | ORAL | Status: AC
  Administered 2021-11-11: 324 mg via ORAL
  Filled 2021-11-11: qty 4

## 2021-11-11 MED ORDER — HEPARIN BOLUS VIA INFUSION
4000.0000 [IU] | Freq: Once | INTRAVENOUS | Status: AC
  Administered 2021-11-11: 4000 [IU] via INTRAVENOUS

## 2021-11-11 MED ORDER — HEPARIN (PORCINE) 25000 UT/250ML-% IV SOLN
1100.0000 [IU]/h | INTRAVENOUS | Status: DC
  Administered 2021-11-11 – 2021-11-12 (×2): 1100 [IU]/h via INTRAVENOUS
  Filled 2021-11-11 (×2): qty 250

## 2021-11-11 NOTE — ED Provider Notes (Signed)
[facility] EMERGENCY DEPARTMENT Provider Note   CSN: [id number] Arrival date & time: 11/11/21  1228     History Chief Complaint  Patient presents with   Loss of Consciousness   Chest Pain    Dean Mendez is a 70 y.o. male.   Loss of Consciousness Associated symptoms: chest pain   Chest Pain Associated symptoms: syncope     Pt started to feel lightheaded when he was in the kitchen around 10 am.  He tried to make it to the chair but he did not make it and passed out.  He does not think he injured himself.  SOmetime after 11 he started having pain in his chest on center and on the right.  No Shorntess of breath.  NO sweating or nausea.  NO history of MI or PE.  Past Medical History:  Diagnosis Date   Hypertension     Patient Active Problem List   Diagnosis Date Noted   HTN (hypertension) 09/25/2019   CKD (chronic kidney disease) stage 3, GFR 30-59 ml/min ([place]) 09/25/2019   Other abnormal glucose 09/25/2019   Other hyperlipidemia 09/25/2019    History reviewed. No pertinent surgical history.     History reviewed. No pertinent family history.  Social History   Tobacco Use   Smoking status: Every Day    Packs/day: 0.25    Types: Cigarettes   Smokeless tobacco: Never  Substance Use Topics   Alcohol use: Not Currently   Drug use: Never    Home Medications Prior to Admission medications   Medication Sig Start Date End Date Taking? Authorizing Provider  CARTIA XT 300 MG 24 hr capsule Take 300 mg by mouth daily. 05/29/19   [provider]  chlorhexidine (PERIDEX) 0.12 % solution 15 mLs 2 (two) times daily. 10/23/19   [provider]  HYDROcodone-acetaminophen (NORCO/VICODIN) 5-325 MG tablet Take 1 tablet by mouth every 4 (four) hours as needed. 10/23/19   [provider]  ibuprofen (ADVIL) 400 MG tablet Take 400 mg by mouth every 6 (six) hours as needed. 10/23/19   [provider]  lisinopril-hydrochlorothiazide  (ZESTORETIC) 20-12.5 MG tablet Take 1 tablet by mouth daily. 06/01/19   [provider]  pravastatin (PRAVACHOL) 20 MG tablet Take 20 mg by mouth daily. 05/29/19   [provider]    Allergies    Patient has no known allergies.  Review of Systems   Review of Systems  Cardiovascular:  Positive for chest pain and syncope.  All other systems reviewed and are negative.  Physical Exam Updated Vital Signs BP (!) 144/78   Pulse 61   Temp 97.8 F (36.6 C) (Oral)   Resp 18   Ht 1.829 m (6')   Wt 89.8 kg   SpO2 96%   BMI 26.85 kg/m   Physical Exam Vitals and nursing note reviewed.  Constitutional:      General: He is not in acute distress.    Appearance: He is well-developed.  HENT:     Head: Normocephalic and atraumatic.     Right Ear: External ear normal.     Left Ear: External ear normal.  Eyes:     General: No scleral icterus.       Right eye: No discharge.        Left eye: No discharge.     Conjunctiva/sclera: Conjunctivae normal.  Neck:     Trachea: No tracheal deviation.  Cardiovascular:     Rate and Rhythm: Normal rate and regular rhythm.  Pulmonary:     Effort: Pulmonary effort is normal. No respiratory distress.     Breath sounds: Normal breath sounds. No stridor. No wheezing or rales.  Abdominal:     General: Bowel sounds are normal. There is no distension.     Palpations: Abdomen is soft.     Tenderness: There is no abdominal tenderness. There is no guarding or rebound.  Musculoskeletal:        General: No tenderness or deformity.     Cervical back: Neck supple.  Skin:    General: Skin is warm and dry.     Findings: No rash.  Neurological:     General: No focal deficit present.     Mental Status: He is alert.     Cranial Nerves: No cranial nerve deficit (no facial droop, extraocular movements intact, no slurred speech).     Sensory: No sensory deficit.     Motor: No abnormal muscle tone or seizure activity.     Coordination: Coordination  normal.  Psychiatric:        Mood and Affect: Mood normal.    ED Results / Procedures / Treatments   Labs (all labs ordered are listed, but only abnormal results are displayed) Labs Reviewed  BASIC METABOLIC PANEL - Abnormal; Notable for the following components:      Result Value   Potassium 3.3 (*)    Creatinine, Ser 1.34 (*)    GFR, Estimated 58 (*)    All other components within normal limits  TROPONIN I (HIGH SENSITIVITY) - Abnormal; Notable for the following components:   Troponin I (High Sensitivity) 77 (*)    All other components within normal limits  RESP PANEL BY RT-PCR (FLU A&B, COVID) ARPGX2  CBC  HEPARIN LEVEL (UNFRACTIONATED)  CBC  TROPONIN I (HIGH SENSITIVITY)    EKG EKG Interpretation  Date/Time:  Tuesday November 11 2021 12:45:08 EST Ventricular Rate:  66 PR Interval:  192 QRS Duration: 82 QT Interval:  392 QTC Calculation: 410 R Axis:   -24 Text Interpretation: Normal sinus rhythm Normal ECG Confirmed by Octaviano Glow [phone]) on 11/11/2021 1:25:10 PM  Radiology DG Chest 2 View  Result Date: 11/11/2021 CLINICAL DATA:  Chest pain, syncope EXAM: CHEST - 2 VIEW COMPARISON:  Previous studies including the CT chest done on 07/06/2019 FINDINGS: Cardiac size is within normal limits. Thoracic aorta is tortuous and ectatic. There are no signs of alveolar pulmonary edema or focal pulmonary consolidation. Centrilobular and panlobular emphysema is seen in the upper lung fields. Linear density in the medial right lower lung fields most likely suggests minimal scarring. There is no pleural effusion or pneumothorax. IMPRESSION: COPD. There are no new infiltrates or signs of pulmonary edema. Linear density in the medial right lower lung fields most likely suggests scarring. Electronically Signed   By: Elmer Picker M.D.   On: 11/11/2021 13:16   CT Angio Chest PE W and/or Wo Contrast  Result Date: 11/11/2021 CLINICAL DATA:  PE suspected, high prob Chest pain and  shortness of breath after fall earlier today. EXAM: CT ANGIOGRAPHY CHEST WITH CONTRAST TECHNIQUE: Multidetector CT imaging of the chest was performed using the standard protocol during bolus administration of intravenous contrast. Multiplanar CT image reconstructions and MIPs were obtained to evaluate the vascular anatomy. CONTRAST:  33mL OMNIPAQUE IOHEXOL 350 MG/ML SOLN COMPARISON:  Radiograph earlier today.  Chest CT 07/06/2019 FINDINGS: Cardiovascular: There are no filling defects within the pulmonary arteries to suggest pulmonary embolus. Atherosclerosis of the thoracic aorta which  is tortuous. No aneurysm or acute aortic findings. The heart is normal in size. There are coronary artery calcifications. No pericardial effusion. Mediastinum/Nodes: No enlarged mediastinal or hilar lymph nodes. No esophageal wall thickening. No visualized thyroid nodule Lungs/Pleura: Advanced emphysema. Bandlike atelectasis or scarring in the right lower lobe. Subsegmental atelectasis in the left lower lobe. No acute airspace disease. No pleural effusion. No pneumothorax. There is retained mucus within the dependent trachea. No dominant pulmonary mass. Previous perifissural lingular nodule has resolved in the interim. Upper Abdomen: No acute findings.  No upper abdominal free fluid. Musculoskeletal: No acute rib fracture. No acute osseous abnormalities are seen. Thoracic spondylosis with endplate spurring. No confluent chest wall contusion. Review of the MIP images confirms the above findings. IMPRESSION: 1. No pulmonary embolus or acute intrathoracic abnormality. 2. Moderately advanced emphysema. Minimal retained mucus in the trachea. 3. Aortic atherosclerosis.  Coronary artery calcifications. Aortic Atherosclerosis (ICD10-I70.0) and Emphysema (ICD10-J43.9). Electronically Signed   By: Narda Rutherford M.D.   On: 11/11/2021 20:14    Procedures Procedures   Medications Ordered in ED Medications  heparin ADULT infusion 100  units/mL (25000 units/228mL) (1,100 Units/hr Intravenous New Bag/Given 11/11/21 1912)  aspirin chewable tablet 324 mg (has no administration in time range)  heparin bolus via infusion 4,000 Units (4,000 Units Intravenous Bolus from Bag 11/11/21 1912)  iohexol (OMNIPAQUE) 350 MG/ML injection 100 mL (75 mLs Intravenous Contrast Given 11/11/21 1953)    ED Course  I have reviewed the triage vital signs and the nursing notes.  Pertinent labs & imaging results that were available during my care of the patient were reviewed by me and considered in my medical decision making (see chart for details).  Clinical Course as of 11/11/21 2133  Tue Nov 11, 2021  1727 Troponin is elevated at 10.  CBC and metabolic panel are normal [JK]  2122 CT angio without evidence of PE [JK]  2122 X-ray without acute findings [JK]  2122 CBC and metabolic panel unremarkable. [JK]  2122 Initial troponin was 13.  Second troponin was 77. [JK]  2131 States he is feeling well.  Chest pain-free. [JK]    Clinical Course User Index [JK] Linwood Dibbles, MD   MDM Rules/Calculators/A&P                           Patient presented to the ED for evaluation after a syncopal episode.  Patient subsequently developed chest pain.  Patient does not have a history of heart disease or pulmonary embolism.  He has not had any fevers or chills.  No vomiting or diarrhea.  ED work-up was notable for an elevated troponin.  Was concerned about the possibility of PE as well as acute coronary syndrome..  CT angiogram was performed and there is no evidence of PE or dissection.  No aneurysm.  Patient does have a rising troponin.  I am concerned about the possibility of cardiac dysrhythmia and acute coronary syndrome.  Patient has been started on heparin.  I will consult the medical service for admission. Final Clinical Impression(s) / ED Diagnoses Final diagnoses:  Syncope and collapse  Elevated troponin     Linwood Dibbles, MD 11/11/21 2133

## 2021-11-11 NOTE — ED Notes (Signed)
Patient transported to X-ray by Wallene Huh. Misty Stanley

## 2021-11-11 NOTE — Progress Notes (Signed)
ANTICOAGULATION CONSULT NOTE  Pharmacy Consult for Heparin Indication: chest pain/ACS  No Known Allergies  Patient Measurements: Height: 6' (182.9 cm) Weight: 89.8 kg (198 lb) IBW/kg (Calculated) : 77.6  Heparin Dosing Weight: 90 kg  Vital Signs: Temp: 97.7 F (36.5 C) (11/22 1624) Temp Source: Oral (11/22 1624) BP: 146/73 (11/22 1753) Pulse Rate: 60 (11/22 1753)  Labs: Recent Labs    11/11/21 1308 11/11/21 1541  HGB 16.0  --   HCT 49.8  --   PLT 270  --   CREATININE 1.34*  --   TROPONINIHS 13 77*    Estimated Creatinine Clearance: 57.9 mL/min (A) (by C-G formula based on SCr of 1.34 mg/dL (H)).   Medical History: Past Medical History:  Diagnosis Date   Hypertension     Assessment: 79 YOM with history significant for hypertension, CKD, and hyperlipidemia presented with syncope and chest pain. Pharmacy consulted to initiate heparin for ACS.  Patient is not on anticoagulation PTA. CBC stable, no concern for bleeding at this time.  Goal of Therapy:  Heparin level 0.3-0.7 units/ml Monitor platelets by anticoagulation protocol: Yes   Plan:  Give 4000 units bolus x 1 Start heparin infusion at 1100 units/hr Check heparin level in 6 hours and daily while on heparin Continue to monitor H&H and platelets   Thank you for allowing pharmacy to be a part of this patient's care.  Thelma Barge, PharmD Clinical Pharmacist

## 2021-11-11 NOTE — H&P (Signed)
History and Physical  TOKIO SOBALVARRO [id number] DOB: 07-22-1951 DOA: 11/11/2021  Referring physician: Dorie Rank, MD  PCP: [name], MD  Patient coming from: Home  Chief Complaint: Chest pain, loss of consciousness  HPI: Dean Mendez is a 70 y.o. male with medical history significant for hypertension, hyperlipidemia who presents to the emergency department due to sudden loss of consciousness and subsequent chest pain at home this morning.  Patient complained of lightheadedness while in the kitchen trying to fix breakfast around 10 AM this morning, patient states that he passed out prior to being able to make it to the sofa, but he quickly recovered, he noted that wife was calling him when he regained consciousness.  He denied tongue bite, urinary or bowel incontinence.  Patient complained of chest pain which started about 30 minutes after recovering from the syncopal episode, it was associated with shortness of breath, chest pain was nonreproducible, midsternal and to the right with no nausea, diaphoresis or sweating.  EMS was activated and patient was taken to the ED for further evaluation and management.  ED Course:  In the emergency department, he was intermittently bradycardic and tachypneic.  BP was 146/73, other vital signs were within normal range.  Work-up in the ED showed normal CBC.  Troponin x2 -13 > 77.  Influenza A, B, SARS coronavirus 2 was negative Chest x-ray showed no new infiltrates or signs of pulmonary edema CT angiography of chest with contrast showed no pulmonary embolus or acute intrathoracic abnormality Aspirin was given and chest pain resolved.  Patient was started on IV heparin drip.  Hospitalist was asked to admit patient for further evaluation and management.  Review of Systems: Constitutional: Negative for chills and fever.  HENT: Negative for ear pain and sore throat.   Eyes: Negative for pain and visual disturbance.  Respiratory: Negative  for cough, chest tightness and shortness of breath.   Cardiovascular: Positive for chest pain and negative for palpitations.  Gastrointestinal: Negative for abdominal pain and vomiting.  Endocrine: Negative for polyphagia and polyuria.  Genitourinary: Negative for decreased urine volume, dysuria, enuresis Musculoskeletal: Negative for arthralgias and back pain.  Skin: Negative for color change and rash.  Allergic/Immunologic: Negative for immunocompromised state.  Neurological: Positive for syncope.  Negative for tremors, speech difficulty and headaches.  Hematological: Does not bruise/bleed easily.  All other systems reviewed and are negative    Past Medical History:  Diagnosis Date   Hypertension    History reviewed. No pertinent surgical history.  Social History:  reports that he has been smoking cigarettes. He has been smoking an average of .25 packs per day. He has never used smokeless tobacco. He reports that he does not currently use alcohol. He reports that he does not use drugs.   No Known Allergies  History reviewed. No pertinent family history.    Prior to Admission medications   Medication Sig Start Date End Date Taking? Authorizing Provider  CARTIA XT 300 MG 24 hr capsule Take 300 mg by mouth daily. 05/29/19   [provider]  chlorhexidine (PERIDEX) 0.12 % solution 15 mLs 2 (two) times daily. 10/23/19   [provider]  HYDROcodone-acetaminophen (NORCO/VICODIN) 5-325 MG tablet Take 1 tablet by mouth every 4 (four) hours as needed. 10/23/19   [provider]  ibuprofen (ADVIL) 400 MG tablet Take 400 mg by mouth every 6 (six) hours as needed. 10/23/19   [provider]  lisinopril-hydrochlorothiazide (ZESTORETIC) 20-12.5 MG tablet Take 1 tablet by mouth  daily. 06/01/19   [provider]  pravastatin (PRAVACHOL) 20 MG tablet Take 20 mg by mouth daily. 05/29/19   [provider]    Physical Exam: BP (!) 160/93   Pulse 62    Temp 97.8 F (36.6 C) (Oral)   Resp (!) 21   Ht 6' (1.829 m)   Wt 89.8 kg   SpO2 99%   BMI 26.85 kg/m   General: 70 y.o. year-old male well developed well nourished in no acute distress.  Alert and oriented x3. HEENT: NCAT, EOMI Neck: Supple, trachea medial Cardiovascular: Regular rate and rhythm with no rubs or gallops.  No thyromegaly or JVD noted.  No lower extremity edema. 2/4 pulses in all 4 extremities. Respiratory: Clear to auscultation with no wheezes or rales. Good inspiratory effort. Abdomen: Soft, nontender nondistended with normal bowel sounds x4 quadrants. Muskuloskeletal: No cyanosis, clubbing or edema noted bilaterally Neuro: CN II-XII intact, strength 5/5 x 4, sensation, reflexes intact Skin: No ulcerative lesions noted or rashes Psychiatry: Judgement and insight appear normal. Mood is appropriate for condition and setting          Labs on Admission:  Basic Metabolic Panel: Recent Labs  Lab 11/11/21 1308  NA 139  K 3.3*  CL 103  CO2 28  GLUCOSE 98  BUN 15  CREATININE 1.34*  CALCIUM 9.2   Liver Function Tests: No results for input(s): AST, ALT, ALKPHOS, BILITOT, PROT, ALBUMIN in the last 168 hours. No results for input(s): LIPASE, AMYLASE in the last 168 hours. No results for input(s): AMMONIA in the last 168 hours. CBC: Recent Labs  Lab 11/11/21 1308  WBC 9.7  HGB 16.0  HCT 49.8  MCV 85.9  PLT 270   Cardiac Enzymes: No results for input(s): CKTOTAL, CKMB, CKMBINDEX, TROPONINI in the last 168 hours.  BNP (last 3 results) No results for input(s): BNP in the last 8760 hours.  ProBNP (last 3 results) No results for input(s): PROBNP in the last 8760 hours.  CBG: No results for input(s): GLUCAP in the last 168 hours.  Radiological Exams on Admission: DG Chest 2 View  Result Date: 11/11/2021 CLINICAL DATA:  Chest pain, syncope EXAM: CHEST - 2 VIEW COMPARISON:  Previous studies including the CT chest done on 07/06/2019 FINDINGS: Cardiac size  is within normal limits. Thoracic aorta is tortuous and ectatic. There are no signs of alveolar pulmonary edema or focal pulmonary consolidation. Centrilobular and panlobular emphysema is seen in the upper lung fields. Linear density in the medial right lower lung fields most likely suggests minimal scarring. There is no pleural effusion or pneumothorax. IMPRESSION: COPD. There are no new infiltrates or signs of pulmonary edema. Linear density in the medial right lower lung fields most likely suggests scarring. Electronically Signed   By: Ernie Avena M.D.   On: 11/11/2021 13:16   CT Angio Chest PE W and/or Wo Contrast  Result Date: 11/11/2021 CLINICAL DATA:  PE suspected, high prob Chest pain and shortness of breath after fall earlier today. EXAM: CT ANGIOGRAPHY CHEST WITH CONTRAST TECHNIQUE: Multidetector CT imaging of the chest was performed using the standard protocol during bolus administration of intravenous contrast. Multiplanar CT image reconstructions and MIPs were obtained to evaluate the vascular anatomy. CONTRAST:  72mL OMNIPAQUE IOHEXOL 350 MG/ML SOLN COMPARISON:  Radiograph earlier today.  Chest CT 07/06/2019 FINDINGS: Cardiovascular: There are no filling defects within the pulmonary arteries to suggest pulmonary embolus. Atherosclerosis of the thoracic aorta which is tortuous. No aneurysm or acute aortic  findings. The heart is normal in size. There are coronary artery calcifications. No pericardial effusion. Mediastinum/Nodes: No enlarged mediastinal or hilar lymph nodes. No esophageal wall thickening. No visualized thyroid nodule Lungs/Pleura: Advanced emphysema. Bandlike atelectasis or scarring in the right lower lobe. Subsegmental atelectasis in the left lower lobe. No acute airspace disease. No pleural effusion. No pneumothorax. There is retained mucus within the dependent trachea. No dominant pulmonary mass. Previous perifissural lingular nodule has resolved in the interim. Upper  Abdomen: No acute findings.  No upper abdominal free fluid. Musculoskeletal: No acute rib fracture. No acute osseous abnormalities are seen. Thoracic spondylosis with endplate spurring. No confluent chest wall contusion. Review of the MIP images confirms the above findings. IMPRESSION: 1. No pulmonary embolus or acute intrathoracic abnormality. 2. Moderately advanced emphysema. Minimal retained mucus in the trachea. 3. Aortic atherosclerosis.  Coronary artery calcifications. Aortic Atherosclerosis (ICD10-I70.0) and Emphysema (ICD10-J43.9). Electronically Signed   By: Keith Rake M.D.   On: 11/11/2021 20:14    EKG: I independently viewed the EKG done and my findings are as followed: Normal sinus rhythm at rate of 66 bpm  Assessment/Plan Present on Admission:  Chest pain  Principal Problem:   Chest pain Active Problems:   Essential hypertension   Mixed hyperlipidemia   Syncope   Hypokalemia   Elevated troponin  Chest pain rule out ACS Elevated troponin possibly due to NSTEMI Continue telemetry Troponins - 13 > 77 > 3676; continue to trend troponin EKG personally reviewed showed normal sinus rhythm at a rate of 66 bpm  Cardiology will be consulted to help decide if Stress test is needed in am Versus other diagnostic modalities.    Aspirin 324 mg was given, Heparin drip was started Give aspirin, nitroglycerin  Acute syncopal episode Continue telemetry and watch for arrhythmias Troponins 13 > 77 > 3676 ; continue to trend troponin EKG showed normal sinus rhythm as described above Echocardiogram will be done to rule out significant aortic stenosis or other outflow obstruction, and also to evaluate EF and to rule out segmental/Regional wall motion abnormalities.  Carotid artery Dopplers will be done to rule out hemodynamically significant stenosis  Hypokalemia K+ is 3.3 K+ will be replenished Please monitor for AM K+ for further replenishmemnt  Essential hypertension Continue  Zestoretic  Mixed hyperlipidemia Continue Pravachol  DVT prophylaxis: Heparin drip  Code Status: Full code  Family Communication: Son at bedside (all questions answered to satisfaction)  Disposition Plan:  Patient is from:                        home Anticipated DC to:                   SNF or family members home Anticipated DC date:               2-3 days Anticipated DC barriers:          Patient requires inpatient management due to chest pain and syncope requiring cardiology consult    Consults called: Cardiology  Admission status: Observation    [name] MD [facility]   11/11/2021, 10:06 PM

## 2021-11-11 NOTE — ED Triage Notes (Signed)
Pt states he passed out while making breakfast this morning and 2 hours later developed CP all over.  +SOB. Denies any N/V.  Denies any diaphoresis.

## 2021-11-12 ENCOUNTER — Ambulatory Visit (HOSPITAL_COMMUNITY): Admit: 2021-11-12 | Payer: Medicare HMO | Admitting: Cardiology

## 2021-11-12 ENCOUNTER — Observation Stay (HOSPITAL_BASED_OUTPATIENT_CLINIC_OR_DEPARTMENT_OTHER): Payer: Medicare HMO

## 2021-11-12 ENCOUNTER — Inpatient Hospital Stay (HOSPITAL_COMMUNITY): Payer: Medicare HMO | Admitting: Anesthesiology

## 2021-11-12 ENCOUNTER — Encounter (HOSPITAL_COMMUNITY)
Admission: EM | Disposition: A | Discharge status: Home Health | Payer: Self-pay | Source: Home / Self Care | Attending: Thoracic Surgery (Cardiothoracic Vascular Surgery)

## 2021-11-12 ENCOUNTER — Other Ambulatory Visit: Payer: Self-pay

## 2021-11-12 ENCOUNTER — Inpatient Hospital Stay (HOSPITAL_COMMUNITY)
Admission: EM | Disposition: A | Discharge status: Home Health | Payer: Self-pay | Source: Home / Self Care | Attending: Thoracic Surgery (Cardiothoracic Vascular Surgery)

## 2021-11-12 ENCOUNTER — Inpatient Hospital Stay (HOSPITAL_COMMUNITY): Payer: Medicare HMO

## 2021-11-12 ENCOUNTER — Encounter (HOSPITAL_COMMUNITY): Payer: Self-pay | Admitting: Internal Medicine

## 2021-11-12 DIAGNOSIS — R079 Chest pain, unspecified: Secondary | ICD-10-CM | POA: Diagnosis not present

## 2021-11-12 DIAGNOSIS — J939 Pneumothorax, unspecified: Secondary | ICD-10-CM | POA: Diagnosis not present

## 2021-11-12 DIAGNOSIS — Z951 Presence of aortocoronary bypass graft: Secondary | ICD-10-CM | POA: Diagnosis not present

## 2021-11-12 DIAGNOSIS — I493 Ventricular premature depolarization: Secondary | ICD-10-CM | POA: Diagnosis present

## 2021-11-12 DIAGNOSIS — J984 Other disorders of lung: Secondary | ICD-10-CM | POA: Diagnosis not present

## 2021-11-12 DIAGNOSIS — R778 Other specified abnormalities of plasma proteins: Secondary | ICD-10-CM | POA: Diagnosis not present

## 2021-11-12 DIAGNOSIS — I25110 Atherosclerotic heart disease of native coronary artery with unstable angina pectoris: Secondary | ICD-10-CM | POA: Diagnosis not present

## 2021-11-12 DIAGNOSIS — I252 Old myocardial infarction: Secondary | ICD-10-CM | POA: Diagnosis not present

## 2021-11-12 DIAGNOSIS — D72829 Elevated white blood cell count, unspecified: Secondary | ICD-10-CM | POA: Diagnosis not present

## 2021-11-12 DIAGNOSIS — Z20822 Contact with and (suspected) exposure to covid-19: Secondary | ICD-10-CM | POA: Diagnosis not present

## 2021-11-12 DIAGNOSIS — E782 Mixed hyperlipidemia: Secondary | ICD-10-CM | POA: Diagnosis not present

## 2021-11-12 DIAGNOSIS — I312 Hemopericardium, not elsewhere classified: Secondary | ICD-10-CM | POA: Diagnosis not present

## 2021-11-12 DIAGNOSIS — Z8249 Family history of ischemic heart disease and other diseases of the circulatory system: Secondary | ICD-10-CM | POA: Diagnosis not present

## 2021-11-12 DIAGNOSIS — I2542 Coronary artery dissection: Secondary | ICD-10-CM

## 2021-11-12 DIAGNOSIS — E785 Hyperlipidemia, unspecified: Secondary | ICD-10-CM

## 2021-11-12 DIAGNOSIS — I2510 Atherosclerotic heart disease of native coronary artery without angina pectoris: Secondary | ICD-10-CM | POA: Diagnosis not present

## 2021-11-12 DIAGNOSIS — I214 Non-ST elevation (NSTEMI) myocardial infarction: Secondary | ICD-10-CM | POA: Diagnosis not present

## 2021-11-12 DIAGNOSIS — I700 Atherosclerosis of aorta: Secondary | ICD-10-CM | POA: Diagnosis not present

## 2021-11-12 DIAGNOSIS — D62 Acute posthemorrhagic anemia: Secondary | ICD-10-CM | POA: Diagnosis not present

## 2021-11-12 DIAGNOSIS — I517 Cardiomegaly: Secondary | ICD-10-CM | POA: Diagnosis not present

## 2021-11-12 DIAGNOSIS — R531 Weakness: Secondary | ICD-10-CM | POA: Diagnosis not present

## 2021-11-12 DIAGNOSIS — R570 Cardiogenic shock: Secondary | ICD-10-CM | POA: Diagnosis not present

## 2021-11-12 DIAGNOSIS — Z4682 Encounter for fitting and adjustment of non-vascular catheter: Secondary | ICD-10-CM | POA: Diagnosis not present

## 2021-11-12 DIAGNOSIS — R918 Other nonspecific abnormal finding of lung field: Secondary | ICD-10-CM | POA: Diagnosis not present

## 2021-11-12 DIAGNOSIS — N179 Acute kidney failure, unspecified: Secondary | ICD-10-CM | POA: Diagnosis not present

## 2021-11-12 DIAGNOSIS — F17210 Nicotine dependence, cigarettes, uncomplicated: Secondary | ICD-10-CM | POA: Diagnosis present

## 2021-11-12 DIAGNOSIS — Z720 Tobacco use: Secondary | ICD-10-CM | POA: Diagnosis not present

## 2021-11-12 DIAGNOSIS — Z79899 Other long term (current) drug therapy: Secondary | ICD-10-CM | POA: Diagnosis not present

## 2021-11-12 DIAGNOSIS — E876 Hypokalemia: Secondary | ICD-10-CM | POA: Diagnosis present

## 2021-11-12 DIAGNOSIS — J942 Hemothorax: Secondary | ICD-10-CM | POA: Diagnosis not present

## 2021-11-12 DIAGNOSIS — Z452 Encounter for adjustment and management of vascular access device: Secondary | ICD-10-CM | POA: Diagnosis not present

## 2021-11-12 DIAGNOSIS — J449 Chronic obstructive pulmonary disease, unspecified: Secondary | ICD-10-CM | POA: Diagnosis not present

## 2021-11-12 DIAGNOSIS — N183 Chronic kidney disease, stage 3 unspecified: Secondary | ICD-10-CM | POA: Diagnosis not present

## 2021-11-12 DIAGNOSIS — I081 Rheumatic disorders of both mitral and tricuspid valves: Secondary | ICD-10-CM | POA: Diagnosis not present

## 2021-11-12 DIAGNOSIS — R7309 Other abnormal glucose: Secondary | ICD-10-CM | POA: Diagnosis not present

## 2021-11-12 DIAGNOSIS — J90 Pleural effusion, not elsewhere classified: Secondary | ICD-10-CM | POA: Diagnosis not present

## 2021-11-12 DIAGNOSIS — I129 Hypertensive chronic kidney disease with stage 1 through stage 4 chronic kidney disease, or unspecified chronic kidney disease: Secondary | ICD-10-CM | POA: Diagnosis not present

## 2021-11-12 DIAGNOSIS — I10 Essential (primary) hypertension: Secondary | ICD-10-CM | POA: Diagnosis not present

## 2021-11-12 DIAGNOSIS — J95811 Postprocedural pneumothorax: Secondary | ICD-10-CM | POA: Diagnosis not present

## 2021-11-12 DIAGNOSIS — J9811 Atelectasis: Secondary | ICD-10-CM | POA: Diagnosis not present

## 2021-11-12 DIAGNOSIS — R55 Syncope and collapse: Secondary | ICD-10-CM | POA: Diagnosis not present

## 2021-11-12 HISTORY — PX: CORONARY BALLOON ANGIOPLASTY: CATH118233

## 2021-11-12 HISTORY — PX: LEFT HEART CATH AND CORONARY ANGIOGRAPHY: CATH118249

## 2021-11-12 HISTORY — PX: CORONARY ARTERY BYPASS GRAFT: SHX141

## 2021-11-12 HISTORY — PX: IABP INSERTION: CATH118242

## 2021-11-12 HISTORY — PX: TEE WITHOUT CARDIOVERSION: SHX5443

## 2021-11-12 LAB — POCT I-STAT EG7
Acid-base deficit: 4 mmol/L — ABNORMAL HIGH (ref 0.0–2.0)
Bicarbonate: 21.6 mmol/L (ref 20.0–28.0)
Calcium, Ion: 0.99 mmol/L — ABNORMAL LOW (ref 1.15–1.40)
HCT: 23 % — ABNORMAL LOW (ref 39.0–52.0)
Hemoglobin: 7.8 g/dL — ABNORMAL LOW (ref 13.0–17.0)
O2 Saturation: 83 %
Potassium: 5.4 mmol/L — ABNORMAL HIGH (ref 3.5–5.1)
Sodium: 139 mmol/L (ref 135–145)
TCO2: 23 mmol/L (ref 22–32)
pCO2, Ven: 43.9 mmHg — ABNORMAL LOW (ref 44.0–60.0)
pH, Ven: 7.301 (ref 7.250–7.430)
pO2, Ven: 53 mmHg — ABNORMAL HIGH (ref 32.0–45.0)

## 2021-11-12 LAB — COMPREHENSIVE METABOLIC PANEL
ALT: 17 U/L (ref 0–44)
AST: 34 U/L (ref 15–41)
Albumin: 3.7 g/dL (ref 3.5–5.0)
Alkaline Phosphatase: 76 U/L (ref 38–126)
Anion gap: 8 (ref 5–15)
BUN: 13 mg/dL (ref 8–23)
CO2: 24 mmol/L (ref 22–32)
Calcium: 9.2 mg/dL (ref 8.9–10.3)
Chloride: 104 mmol/L (ref 98–111)
Creatinine, Ser: 1.04 mg/dL (ref 0.61–1.24)
GFR, Estimated: >60 mL/min (ref >60)
Glucose, Bld: 127 mg/dL — ABNORMAL HIGH (ref 70–99)
Potassium: 3 mmol/L — ABNORMAL LOW (ref 3.5–5.1)
Sodium: 136 mmol/L (ref 135–145)
Total Bilirubin: 0.6 mg/dL (ref 0.3–1.2)
Total Protein: 6.5 g/dL (ref 6.5–8.1)

## 2021-11-12 LAB — POCT I-STAT 7, (LYTES, BLD GAS, ICA,H+H)
Acid-base deficit: 2 mmol/L (ref 0.0–2.0)
Acid-base deficit: 2 mmol/L (ref 0.0–2.0)
Acid-base deficit: 4 mmol/L — ABNORMAL HIGH (ref 0.0–2.0)
Acid-base deficit: 5 mmol/L — ABNORMAL HIGH (ref 0.0–2.0)
Bicarbonate: 21.3 mmol/L (ref 20.0–28.0)
Bicarbonate: 21.6 mmol/L (ref 20.0–28.0)
Bicarbonate: 22.3 mmol/L (ref 20.0–28.0)
Bicarbonate: 24.5 mmol/L (ref 20.0–28.0)
Calcium, Ion: 0.99 mmol/L — ABNORMAL LOW (ref 1.15–1.40)
Calcium, Ion: 1 mmol/L — ABNORMAL LOW (ref 1.15–1.40)
Calcium, Ion: 1.23 mmol/L (ref 1.15–1.40)
Calcium, Ion: 1.25 mmol/L (ref 1.15–1.40)
HCT: 26 % — ABNORMAL LOW (ref 39.0–52.0)
HCT: 26 % — ABNORMAL LOW (ref 39.0–52.0)
HCT: 26 % — ABNORMAL LOW (ref 39.0–52.0)
HCT: 40 % (ref 39.0–52.0)
Hemoglobin: 13.6 g/dL (ref 13.0–17.0)
Hemoglobin: 8.8 g/dL — ABNORMAL LOW (ref 13.0–17.0)
Hemoglobin: 8.8 g/dL — ABNORMAL LOW (ref 13.0–17.0)
Hemoglobin: 8.8 g/dL — ABNORMAL LOW (ref 13.0–17.0)
O2 Saturation: 100 %
O2 Saturation: 100 %
O2 Saturation: 100 %
O2 Saturation: 99 %
Potassium: 3.8 mmol/L (ref 3.5–5.1)
Potassium: 4.2 mmol/L (ref 3.5–5.1)
Potassium: 4.4 mmol/L (ref 3.5–5.1)
Potassium: 4.6 mmol/L (ref 3.5–5.1)
Sodium: 139 mmol/L (ref 135–145)
Sodium: 140 mmol/L (ref 135–145)
Sodium: 140 mmol/L (ref 135–145)
Sodium: 141 mmol/L (ref 135–145)
TCO2: 23 mmol/L (ref 22–32)
TCO2: 23 mmol/L (ref 22–32)
TCO2: 23 mmol/L (ref 22–32)
TCO2: 26 mmol/L (ref 22–32)
pCO2 arterial: 37.4 mmHg (ref 32.0–48.0)
pCO2 arterial: 42.1 mmHg (ref 32.0–48.0)
pCO2 arterial: 42.6 mmHg (ref 32.0–48.0)
pCO2 arterial: 47.3 mmHg (ref 32.0–48.0)
pH, Arterial: 7.307 — ABNORMAL LOW (ref 7.350–7.450)
pH, Arterial: 7.318 — ABNORMAL LOW (ref 7.350–7.450)
pH, Arterial: 7.322 — ABNORMAL LOW (ref 7.350–7.450)
pH, Arterial: 7.384 (ref 7.350–7.450)
pO2, Arterial: 162 mmHg — ABNORMAL HIGH (ref 83.0–108.0)
pO2, Arterial: 193 mmHg — ABNORMAL HIGH (ref 83.0–108.0)
pO2, Arterial: 310 mmHg — ABNORMAL HIGH (ref 83.0–108.0)
pO2, Arterial: 322 mmHg — ABNORMAL HIGH (ref 83.0–108.0)

## 2021-11-12 LAB — HEPARIN LEVEL (UNFRACTIONATED)
Heparin Unfractionated: 0.52 IU/mL (ref 0.30–0.70)
Heparin Unfractionated: 0.49 IU/mL (ref 0.30–0.70)

## 2021-11-12 LAB — POCT I-STAT, CHEM 8
BUN: 10 mg/dL (ref 8–23)
BUN: 10 mg/dL (ref 8–23)
BUN: 10 mg/dL (ref 8–23)
BUN: 12 mg/dL (ref 8–23)
Calcium, Ion: 0.98 mmol/L — ABNORMAL LOW (ref 1.15–1.40)
Calcium, Ion: 1.22 mmol/L (ref 1.15–1.40)
Calcium, Ion: 1.23 mmol/L (ref 1.15–1.40)
Calcium, Ion: 1.24 mmol/L (ref 1.15–1.40)
Chloride: 107 mmol/L (ref 98–111)
Chloride: 107 mmol/L (ref 98–111)
Chloride: 108 mmol/L (ref 98–111)
Chloride: 109 mmol/L (ref 98–111)
Creatinine, Ser: 0.8 mg/dL (ref 0.61–1.24)
Creatinine, Ser: 0.9 mg/dL (ref 0.61–1.24)
Creatinine, Ser: 1 mg/dL (ref 0.61–1.24)
Creatinine, Ser: 1 mg/dL (ref 0.61–1.24)
Glucose, Bld: 106 mg/dL — ABNORMAL HIGH (ref 70–99)
Glucose, Bld: 82 mg/dL (ref 70–99)
Glucose, Bld: 82 mg/dL (ref 70–99)
Glucose, Bld: 93 mg/dL (ref 70–99)
HCT: 24 % — ABNORMAL LOW (ref 39.0–52.0)
HCT: 26 % — ABNORMAL LOW (ref 39.0–52.0)
HCT: 37 % — ABNORMAL LOW (ref 39.0–52.0)
HCT: 40 % (ref 39.0–52.0)
Hemoglobin: 12.6 g/dL — ABNORMAL LOW (ref 13.0–17.0)
Hemoglobin: 13.6 g/dL (ref 13.0–17.0)
Hemoglobin: 8.2 g/dL — ABNORMAL LOW (ref 13.0–17.0)
Hemoglobin: 8.8 g/dL — ABNORMAL LOW (ref 13.0–17.0)
Potassium: 3.8 mmol/L (ref 3.5–5.1)
Potassium: 3.9 mmol/L (ref 3.5–5.1)
Potassium: 4.4 mmol/L (ref 3.5–5.1)
Potassium: 4.6 mmol/L (ref 3.5–5.1)
Sodium: 140 mmol/L (ref 135–145)
Sodium: 140 mmol/L (ref 135–145)
Sodium: 140 mmol/L (ref 135–145)
Sodium: 141 mmol/L (ref 135–145)
TCO2: 22 mmol/L (ref 22–32)
TCO2: 23 mmol/L (ref 22–32)
TCO2: 24 mmol/L (ref 22–32)
TCO2: 24 mmol/L (ref 22–32)

## 2021-11-12 LAB — PHOSPHORUS: Phosphorus: 2.5 mg/dL (ref 2.5–4.6)

## 2021-11-12 LAB — ECHOCARDIOGRAM COMPLETE
AR max vel: 1.97 cm2
AV Area VTI: 2.2 cm2
AV Area mean vel: 2.07 cm2
AV Mean grad: 5 mmHg
AV Peak grad: 8.8 mmHg
Ao pk vel: 1.48 m/s
Area-P 1/2: 4.29 cm2
Area-P 1/2: 3.6 cm2
Calc EF: 54.9 %
Height: 72 in
Height: 72 in
MV VTI: 3.55 cm2
S' Lateral: 2.2 cm
S' Lateral: 2.2 cm
Single Plane A2C EF: 52.5 %
Single Plane A4C EF: 59.3 %
Single Plane A4C EF: 45.3 %
Weight: 3168 oz
Weight: 3168 oz

## 2021-11-12 LAB — POCT ACTIVATED CLOTTING TIME: Activated Clotting Time: 434 seconds

## 2021-11-12 LAB — CBC
HCT: 47.1 % (ref 39.0–52.0)
Hemoglobin: 15.7 g/dL (ref 13.0–17.0)
MCH: 28.3 pg (ref 26.0–34.0)
MCHC: 33.3 g/dL (ref 30.0–36.0)
MCV: 84.9 fL (ref 80.0–100.0)
Platelets: 233 10*3/uL (ref 150–400)
RBC: 5.55 MIL/uL (ref 4.22–5.81)
RDW: 14.2 % (ref 11.5–15.5)
WBC: 7.7 10*3/uL (ref 4.0–10.5)
nRBC: 0 % (ref 0.0–0.2)

## 2021-11-12 LAB — HEMOGLOBIN A1C
Hgb A1c MFr Bld: 5.6 % (ref 4.8–5.6)
Mean Plasma Glucose: 114.02 mg/dL

## 2021-11-12 LAB — MAGNESIUM: Magnesium: 2.1 mg/dL (ref 1.7–2.4)

## 2021-11-12 LAB — TROPONIN I (HIGH SENSITIVITY)
Troponin I (High Sensitivity): 3676 ng/L (ref <18)
Troponin I (High Sensitivity): 4175 ng/L (ref <18)

## 2021-11-12 LAB — PREPARE RBC (CROSSMATCH)

## 2021-11-12 LAB — HEMOGLOBIN AND HEMATOCRIT, BLOOD
HCT: 25.9 % — ABNORMAL LOW (ref 39.0–52.0)
Hemoglobin: 8.4 g/dL — ABNORMAL LOW (ref 13.0–17.0)

## 2021-11-12 LAB — HIV ANTIBODY (ROUTINE TESTING W REFLEX): HIV Screen 4th Generation wRfx: NONREACTIVE

## 2021-11-12 LAB — PLATELET COUNT: Platelets: 135 10*3/uL — ABNORMAL LOW (ref 150–400)

## 2021-11-12 LAB — APTT: aPTT: 73 seconds — ABNORMAL HIGH (ref 24–36)

## 2021-11-12 LAB — ABO/RH: ABO/RH(D): A POS

## 2021-11-12 SURGERY — CORONARY ARTERY BYPASS GRAFTING (CABG)
Anesthesia: General | Site: Chest

## 2021-11-12 SURGERY — LEFT HEART CATH AND CORONARY ANGIOGRAPHY
Anesthesia: LOCAL

## 2021-11-12 SURGERY — IABP INSERTION
Anesthesia: LOCAL

## 2021-11-12 MED ORDER — MIDAZOLAM HCL 2 MG/2ML IJ SOLN
INTRAMUSCULAR | Status: AC
  Filled 2021-11-12: qty 2

## 2021-11-12 MED ORDER — TICAGRELOR 90 MG PO TABS
ORAL_TABLET | ORAL | Status: DC | PRN
  Administered 2021-11-12: 180 mg via ORAL

## 2021-11-12 MED ORDER — HEPARIN SODIUM (PORCINE) 1000 UNIT/ML IJ SOLN
INTRAMUSCULAR | Status: DC | PRN
  Administered 2021-11-12: 4500 [IU] via INTRAVENOUS
  Administered 2021-11-12: 5000 [IU] via INTRAVENOUS

## 2021-11-12 MED ORDER — LISINOPRIL-HYDROCHLOROTHIAZIDE 20-12.5 MG PO TABS: 1.0000 | ORAL_TABLET | Freq: Every day | ORAL | Status: DC

## 2021-11-12 MED ORDER — NOREPINEPHRINE 4 MG/250ML-% IV SOLN
0.0000 ug/min | INTRAVENOUS | Status: DC
  Filled 2021-11-12: qty 250

## 2021-11-12 MED ORDER — LACTATED RINGERS IV SOLN: INTRAVENOUS | Status: DC | PRN

## 2021-11-12 MED ORDER — METOPROLOL TARTRATE 12.5 MG HALF TABLET
12.5000 mg | ORAL_TABLET | Freq: Two times a day (BID) | ORAL | Status: DC
  Administered 2021-11-12: 12.5 mg via ORAL
  Filled 2021-11-12: qty 1

## 2021-11-12 MED ORDER — FENTANYL CITRATE (PF) 250 MCG/5ML IJ SOLN
INTRAMUSCULAR | Status: DC | PRN
  Administered 2021-11-12 (×3): 100 ug via INTRAVENOUS
  Administered 2021-11-12: 200 ug via INTRAVENOUS
  Administered 2021-11-12: 250 ug via INTRAVENOUS
  Administered 2021-11-12 (×2): 100 ug via INTRAVENOUS

## 2021-11-12 MED ORDER — EPINEPHRINE HCL 5 MG/250ML IV SOLN IN NS
0.0000 ug/min | INTRAVENOUS | Status: DC
  Filled 2021-11-12: qty 250

## 2021-11-12 MED ORDER — CEFAZOLIN SODIUM-DEXTROSE 2-4 GM/100ML-% IV SOLN
2.0000 g | INTRAVENOUS | Status: AC
  Administered 2021-11-12 (×2): 2 g via INTRAVENOUS
  Filled 2021-11-12: qty 100

## 2021-11-12 MED ORDER — NITROGLYCERIN IN D5W 200-5 MCG/ML-% IV SOLN
INTRAVENOUS | Status: AC
  Filled 2021-11-12: qty 250

## 2021-11-12 MED ORDER — NICARDIPINE HCL IN NACL 20-0.86 MG/200ML-% IV SOLN
3.0000 mg/h | INTRAVENOUS | Status: DC
  Filled 2021-11-12: qty 200

## 2021-11-12 MED ORDER — HEPARIN SODIUM (PORCINE) 1000 UNIT/ML IJ SOLN
INTRAMUSCULAR | Status: AC
  Filled 2021-11-12: qty 10

## 2021-11-12 MED ORDER — HEPARIN SODIUM (PORCINE) 1000 UNIT/ML IJ SOLN
INTRAMUSCULAR | Status: AC
  Filled 2021-11-12: qty 1

## 2021-11-12 MED ORDER — NITROGLYCERIN 0.4 MG SL SUBL
SUBLINGUAL_TABLET | SUBLINGUAL | Status: AC
  Filled 2021-11-12: qty 1

## 2021-11-12 MED ORDER — EPHEDRINE SULFATE 50 MG/ML IJ SOLN
INTRAMUSCULAR | Status: DC | PRN
  Administered 2021-11-12: 5 mg via INTRAVENOUS

## 2021-11-12 MED ORDER — PROTAMINE SULFATE 10 MG/ML IV SOLN
INTRAVENOUS | Status: DC | PRN
  Administered 2021-11-12: 330 mg via INTRAVENOUS

## 2021-11-12 MED ORDER — TIROFIBAN HCL IN NACL 5-0.9 MG/100ML-% IV SOLN
INTRAVENOUS | Status: AC
  Filled 2021-11-12: qty 100

## 2021-11-12 MED ORDER — FENTANYL CITRATE (PF) 100 MCG/2ML IJ SOLN
INTRAMUSCULAR | Status: AC
  Filled 2021-11-12: qty 2

## 2021-11-12 MED ORDER — VERAPAMIL HCL 2.5 MG/ML IV SOLN
INTRAVENOUS | Status: AC
  Filled 2021-11-12: qty 2

## 2021-11-12 MED ORDER — MIDAZOLAM HCL 5 MG/5ML IJ SOLN
INTRAMUSCULAR | Status: DC | PRN
  Administered 2021-11-12: 1 mg via INTRAVENOUS
  Administered 2021-11-12: 5 mg via INTRAVENOUS

## 2021-11-12 MED ORDER — PRAVASTATIN SODIUM 10 MG PO TABS: 20.0000 mg | ORAL_TABLET | Freq: Every day | ORAL | Status: DC

## 2021-11-12 MED ORDER — MILRINONE LACTATE IN DEXTROSE 20-5 MG/100ML-% IV SOLN
0.3000 ug/kg/min | INTRAVENOUS | Status: DC
  Filled 2021-11-12: qty 100

## 2021-11-12 MED ORDER — ETOMIDATE 2 MG/ML IV SOLN
INTRAVENOUS | Status: AC
  Filled 2021-11-12: qty 10

## 2021-11-12 MED ORDER — TICAGRELOR 90 MG PO TABS
ORAL_TABLET | ORAL | Status: AC
  Filled 2021-11-12: qty 2

## 2021-11-12 MED ORDER — NITROGLYCERIN IN D5W 200-5 MCG/ML-% IV SOLN
INTRAVENOUS | Status: AC | PRN
  Administered 2021-11-12: 5 ug/min via INTRAVENOUS

## 2021-11-12 MED ORDER — PROPOFOL 10 MG/ML IV BOLUS
INTRAVENOUS | Status: AC
  Filled 2021-11-12: qty 20

## 2021-11-12 MED ORDER — ROCURONIUM BROMIDE 10 MG/ML (PF) SYRINGE
PREFILLED_SYRINGE | INTRAVENOUS | Status: DC | PRN
  Administered 2021-11-12: 50 mg via INTRAVENOUS
  Administered 2021-11-12: 100 mg via INTRAVENOUS
  Administered 2021-11-12: 50 mg via INTRAVENOUS

## 2021-11-12 MED ORDER — POTASSIUM CHLORIDE CRYS ER 20 MEQ PO TBCR
40.0000 meq | EXTENDED_RELEASE_TABLET | Freq: Once | ORAL | Status: AC
  Administered 2021-11-12: 40 meq via ORAL
  Filled 2021-11-12: qty 2

## 2021-11-12 MED ORDER — PLASMA-LYTE A IV SOLN
INTRAVENOUS | Status: DC | PRN
  Administered 2021-11-12: 1000 mL via INTRAVASCULAR

## 2021-11-12 MED ORDER — VERAPAMIL HCL 2.5 MG/ML IV SOLN
INTRAVENOUS | Status: DC | PRN
  Administered 2021-11-12: 10 mL via INTRA_ARTERIAL

## 2021-11-12 MED ORDER — ROCURONIUM BROMIDE 10 MG/ML (PF) SYRINGE
PREFILLED_SYRINGE | INTRAVENOUS | Status: AC
  Filled 2021-11-12: qty 10

## 2021-11-12 MED ORDER — POTASSIUM CHLORIDE 10 MEQ/100ML IV SOLN
10.0000 meq | INTRAVENOUS | Status: AC
  Administered 2021-11-12 (×2): 10 meq via INTRAVENOUS
  Filled 2021-11-12 (×2): qty 100

## 2021-11-12 MED ORDER — FENTANYL CITRATE (PF) 250 MCG/5ML IJ SOLN
INTRAMUSCULAR | Status: AC
  Filled 2021-11-12: qty 25

## 2021-11-12 MED ORDER — ASPIRIN EC 81 MG PO TBEC
81.0000 mg | DELAYED_RELEASE_TABLET | Freq: Every day | ORAL | Status: DC
  Administered 2021-11-12: 81 mg via ORAL
  Filled 2021-11-12: qty 1

## 2021-11-12 MED ORDER — NITROGLYCERIN 0.4 MG SL SUBL
SUBLINGUAL_TABLET | SUBLINGUAL | Status: DC | PRN
  Administered 2021-11-12: .4 mg via SUBLINGUAL

## 2021-11-12 MED ORDER — PHENYLEPHRINE HCL-NACL 20-0.9 MG/250ML-% IV SOLN
30.0000 ug/min | INTRAVENOUS | Status: AC
  Administered 2021-11-12: 40 ug/min via INTRAVENOUS
  Filled 2021-11-12: qty 250

## 2021-11-12 MED ORDER — MIDAZOLAM HCL (PF) 10 MG/2ML IJ SOLN
INTRAMUSCULAR | Status: AC
  Filled 2021-11-12: qty 2

## 2021-11-12 MED ORDER — ISOSORBIDE MONONITRATE ER 30 MG PO TB24
30.0000 mg | ORAL_TABLET | Freq: Every day | ORAL | Status: DC
  Administered 2021-11-12: 30 mg via ORAL
  Filled 2021-11-12: qty 1

## 2021-11-12 MED ORDER — NITROGLYCERIN 1 MG/10 ML FOR IR/CATH LAB
INTRA_ARTERIAL | Status: AC
  Filled 2021-11-12: qty 10

## 2021-11-12 MED ORDER — LISINOPRIL 20 MG PO TABS
20.0000 mg | ORAL_TABLET | Freq: Every day | ORAL | Status: DC
  Administered 2021-11-12: 20 mg via ORAL
  Filled 2021-11-12: qty 2

## 2021-11-12 MED ORDER — 0.9 % SODIUM CHLORIDE (POUR BTL) OPTIME
TOPICAL | Status: DC | PRN
  Administered 2021-11-12: 1000 mL

## 2021-11-12 MED ORDER — DEXMEDETOMIDINE HCL IN NACL 400 MCG/100ML IV SOLN
0.1000 ug/kg/h | INTRAVENOUS | Status: AC
  Administered 2021-11-12: .3 ug/kg/h via INTRAVENOUS
  Filled 2021-11-12: qty 100

## 2021-11-12 MED ORDER — TIROFIBAN (AGGRASTAT) BOLUS VIA INFUSION
INTRAVENOUS | Status: DC | PRN
  Administered 2021-11-12: 2245 ug via INTRAVENOUS

## 2021-11-12 MED ORDER — POTASSIUM CHLORIDE 2 MEQ/ML IV SOLN
80.0000 meq | INTRAVENOUS | Status: DC
  Filled 2021-11-12: qty 40

## 2021-11-12 MED ORDER — IOHEXOL 350 MG/ML SOLN
INTRAVENOUS | Status: DC | PRN
  Administered 2021-11-12: 105 mL via INTRA_ARTERIAL

## 2021-11-12 MED ORDER — SODIUM CHLORIDE 0.9 % WEIGHT BASED INFUSION: 1.0000 mL/kg/h | INTRAVENOUS | Status: AC

## 2021-11-12 MED ORDER — FENTANYL CITRATE (PF) 100 MCG/2ML IJ SOLN
INTRAMUSCULAR | Status: DC | PRN
  Administered 2021-11-12: 25 ug via INTRAVENOUS
  Administered 2021-11-12: 25 ug
  Administered 2021-11-12: 50 ug via INTRAVENOUS

## 2021-11-12 MED ORDER — TRANEXAMIC ACID (OHS) PUMP PRIME SOLUTION
2.0000 mg/kg | INTRAVENOUS | Status: DC
  Filled 2021-11-12: qty 1.8

## 2021-11-12 MED ORDER — NITROGLYCERIN 0.4 MG SL SUBL: 0.4000 mg | SUBLINGUAL_TABLET | SUBLINGUAL | Status: DC | PRN

## 2021-11-12 MED ORDER — HEMOSTATIC AGENTS (NO CHARGE) OPTIME
TOPICAL | Status: DC | PRN
  Administered 2021-11-12: 1 via TOPICAL

## 2021-11-12 MED ORDER — INSULIN REGULAR(HUMAN) IN NACL 100-0.9 UT/100ML-% IV SOLN
INTRAVENOUS | Status: AC
  Administered 2021-11-12: .6 [IU]/h via INTRAVENOUS
  Filled 2021-11-12: qty 100

## 2021-11-12 MED ORDER — CEFAZOLIN SODIUM-DEXTROSE 2-4 GM/100ML-% IV SOLN
2.0000 g | INTRAVENOUS | Status: DC
  Filled 2021-11-12: qty 100

## 2021-11-12 MED ORDER — LIDOCAINE HCL (PF) 1 % IJ SOLN
INTRAMUSCULAR | Status: DC | PRN
  Administered 2021-11-12: 15 mg
  Administered 2021-11-12: 2 mL via INTRADERMAL

## 2021-11-12 MED ORDER — MIDAZOLAM HCL 2 MG/2ML IJ SOLN
INTRAMUSCULAR | Status: DC | PRN
  Administered 2021-11-12: 1 mg
  Administered 2021-11-12: 2 mg via INTRAVENOUS

## 2021-11-12 MED ORDER — HEPARIN 30,000 UNITS/1000 ML (OHS) CELLSAVER SOLUTION
Status: DC
  Filled 2021-11-12: qty 1000

## 2021-11-12 MED ORDER — SODIUM CHLORIDE 0.9 % WEIGHT BASED INFUSION: 1.0000 mL/kg/h | INTRAVENOUS | Status: DC

## 2021-11-12 MED ORDER — MANNITOL 20 % IV SOLN
INTRAVENOUS | Status: DC
  Filled 2021-11-12: qty 13

## 2021-11-12 MED ORDER — TIROFIBAN HCL IN NACL 5-0.9 MG/100ML-% IV SOLN
INTRAVENOUS | Status: DC | PRN
  Administered 2021-11-12: .15 ug/kg/min via INTRAVENOUS

## 2021-11-12 MED ORDER — PHENYLEPHRINE 40 MCG/ML (10ML) SYRINGE FOR IV PUSH (FOR BLOOD PRESSURE SUPPORT)
PREFILLED_SYRINGE | INTRAVENOUS | Status: DC | PRN
  Administered 2021-11-12 (×2): 80 ug via INTRAVENOUS
  Administered 2021-11-12: 40 ug via INTRAVENOUS
  Administered 2021-11-12 (×2): 80 ug via INTRAVENOUS
  Administered 2021-11-12: 40 ug via INTRAVENOUS

## 2021-11-12 MED ORDER — VANCOMYCIN HCL 1500 MG/300ML IV SOLN
1500.0000 mg | INTRAVENOUS | Status: AC
  Administered 2021-11-12: 1500 mg via INTRAVENOUS
  Filled 2021-11-12: qty 300

## 2021-11-12 MED ORDER — HEPARIN (PORCINE) IN NACL 1000-0.9 UT/500ML-% IV SOLN
INTRAVENOUS | Status: DC | PRN
  Administered 2021-11-12 (×2): 500 mL

## 2021-11-12 MED ORDER — LIDOCAINE HCL (PF) 1 % IJ SOLN
INTRAMUSCULAR | Status: AC
  Filled 2021-11-12: qty 30

## 2021-11-12 MED ORDER — SODIUM CHLORIDE (PF) 0.9 % IJ SOLN
OROMUCOSAL | Status: DC | PRN
  Administered 2021-11-12 (×3): 4 mL via TOPICAL

## 2021-11-12 MED ORDER — HEPARIN SODIUM (PORCINE) 1000 UNIT/ML IJ SOLN
INTRAMUSCULAR | Status: DC | PRN
  Administered 2021-11-12: 33000 [IU] via INTRAVENOUS

## 2021-11-12 MED ORDER — NITROGLYCERIN IN D5W 200-5 MCG/ML-% IV SOLN
2.0000 ug/min | INTRAVENOUS | Status: AC
  Administered 2021-11-12: 10 ug/min via INTRAVENOUS
  Filled 2021-11-12: qty 250

## 2021-11-12 MED ORDER — HEPARIN (PORCINE) IN NACL 1000-0.9 UT/500ML-% IV SOLN
INTRAVENOUS | Status: AC
  Filled 2021-11-12: qty 1000

## 2021-11-12 MED ORDER — ATORVASTATIN CALCIUM 40 MG PO TABS
40.0000 mg | ORAL_TABLET | Freq: Every day | ORAL | Status: DC
  Administered 2021-11-12 – 2021-11-14 (×3): 40 mg via ORAL
  Filled 2021-11-12 (×3): qty 1

## 2021-11-12 MED ORDER — TRANEXAMIC ACID (OHS) BOLUS VIA INFUSION
15.0000 mg/kg | INTRAVENOUS | Status: AC
  Administered 2021-11-12: 1347 mg via INTRAVENOUS
  Filled 2021-11-12: qty 1347

## 2021-11-12 MED ORDER — TRANEXAMIC ACID 1000 MG/10ML IV SOLN
1.5000 mg/kg/h | INTRAVENOUS | Status: AC
  Administered 2021-11-12: 1.5 mg/kg/h via INTRAVENOUS
  Filled 2021-11-12: qty 25

## 2021-11-12 MED ORDER — HYDROCHLOROTHIAZIDE 12.5 MG PO TABS
12.5000 mg | ORAL_TABLET | Freq: Every day | ORAL | Status: DC
  Filled 2021-11-12: qty 1

## 2021-11-12 MED ORDER — PLASMA-LYTE A IV SOLN
INTRAVENOUS | Status: DC
  Filled 2021-11-12: qty 5

## 2021-11-12 MED ORDER — SODIUM CHLORIDE 0.9 % WEIGHT BASED INFUSION
3.0000 mL/kg/h | INTRAVENOUS | Status: AC
  Administered 2021-11-12: 3 mL/kg/h via INTRAVENOUS

## 2021-11-12 SURGICAL SUPPLY — 89 items
BAG DECANTER FOR FLEXI CONT (MISCELLANEOUS) ×3 IMPLANT
BLADE CLIPPER SURG (BLADE) ×3 IMPLANT
BLADE STERNUM SYSTEM 6 (BLADE) ×3 IMPLANT
BNDG CMPR MED 10X6 ELC LF (GAUZE/BANDAGES/DRESSINGS) ×2
BNDG ELASTIC 4X5.8 VLCR STR LF (GAUZE/BANDAGES/DRESSINGS) ×3 IMPLANT
BNDG ELASTIC 6X10 VLCR STRL LF (GAUZE/BANDAGES/DRESSINGS) ×1 IMPLANT
BNDG ELASTIC 6X5.8 VLCR STR LF (GAUZE/BANDAGES/DRESSINGS) ×3 IMPLANT
BNDG GAUZE ELAST 4 BULKY (GAUZE/BANDAGES/DRESSINGS) ×3 IMPLANT
CABLE SURGICAL S-101-97-12 (CABLE) ×3 IMPLANT
CANISTER SUCT 3000ML PPV (MISCELLANEOUS) ×3 IMPLANT
CANNULA MC2 2 STG 29/37 NON-V (CANNULA) ×2 IMPLANT
CANNULA MC2 TWO STAGE (CANNULA) ×3
CANNULA NON VENT 20FR 12 (CANNULA) ×3 IMPLANT
CATH ROBINSON RED A/P 18FR (CATHETERS) ×7 IMPLANT
CLIP RETRACTION 3.0MM CORONARY (MISCELLANEOUS) ×3 IMPLANT
CLIP VESOCCLUDE MED 24/CT (CLIP) IMPLANT
CLIP VESOCCLUDE SM WIDE 24/CT (CLIP) IMPLANT
CONN ST 1/2X1/2  BEN (MISCELLANEOUS) ×3
CONN ST 1/2X1/2 BEN (MISCELLANEOUS) ×2 IMPLANT
CONNECTOR BLAKE 2:1 CARIO BLK (MISCELLANEOUS) ×3 IMPLANT
CONTAINER PROTECT SURGISLUSH (MISCELLANEOUS) ×6 IMPLANT
DRAIN CHANNEL 19F RND (DRAIN) ×9 IMPLANT
DRAIN CONNECTOR BLAKE 1:1 (MISCELLANEOUS) ×3 IMPLANT
DRAPE CARDIOVASCULAR INCISE (DRAPES) ×3
DRAPE INCISE IOBAN 66X45 STRL (DRAPES) IMPLANT
DRAPE SRG 135X102X78XABS (DRAPES) ×2 IMPLANT
DRAPE WARM FLUID 44X44 (DRAPES) ×3 IMPLANT
DRSG AQUACEL AG ADV 3.5X10 (GAUZE/BANDAGES/DRESSINGS) ×3 IMPLANT
DRSG COVADERM 4X10 (GAUZE/BANDAGES/DRESSINGS) ×1 IMPLANT
DRSG COVADERM 4X14 (GAUZE/BANDAGES/DRESSINGS) ×3 IMPLANT
ELECT BLADE 4.0 EZ CLEAN MEGAD (MISCELLANEOUS) ×3
ELECT REM PT RETURN 9FT ADLT (ELECTROSURGICAL) ×6
ELECTRODE BLDE 4.0 EZ CLN MEGD (MISCELLANEOUS) ×2 IMPLANT
ELECTRODE REM PT RTRN 9FT ADLT (ELECTROSURGICAL) ×4 IMPLANT
FELT TEFLON 1X6 (MISCELLANEOUS) ×5 IMPLANT
GAUZE 4X4 16PLY ~~LOC~~+RFID DBL (SPONGE) ×1 IMPLANT
GAUZE SPONGE 4X4 12PLY STRL (GAUZE/BANDAGES/DRESSINGS) ×6 IMPLANT
GLOVE SURG ENC MOIS LTX SZ7 (GLOVE) ×6 IMPLANT
GLOVE SURG ENC MOIS LTX SZ7.5 (GLOVE) ×2 IMPLANT
GLOVE SURG ENC TEXT LTX SZ7.5 (GLOVE) ×6 IMPLANT
GLOVE SURG MICRO LTX SZ6.5 (GLOVE) ×1 IMPLANT
GLOVE SURG NEOPR MICRO LF SZ7 (GLOVE) ×3 IMPLANT
GLOVE SURG POLYISO LF SZ6.5 (GLOVE) ×2 IMPLANT
GLOVE SURG POLYISO LF SZ7 (GLOVE) ×1 IMPLANT
GLOVE SURG POLYISO LF SZ7.5 (GLOVE) ×2 IMPLANT
GOWN STRL REUS W/ TWL LRG LVL3 (GOWN DISPOSABLE) ×8 IMPLANT
GOWN STRL REUS W/ TWL XL LVL3 (GOWN DISPOSABLE) ×4 IMPLANT
GOWN STRL REUS W/TWL LRG LVL3 (GOWN DISPOSABLE) ×18
GOWN STRL REUS W/TWL XL LVL3 (GOWN DISPOSABLE) ×6
HEMOSTAT POWDER SURGIFOAM 1G (HEMOSTASIS) ×9 IMPLANT
INSERT SUTURE HOLDER (MISCELLANEOUS) ×3 IMPLANT
KIT BASIN OR (CUSTOM PROCEDURE TRAY) ×3 IMPLANT
KIT SUCTION CATH 14FR (SUCTIONS) ×3 IMPLANT
KIT TURNOVER KIT B (KITS) ×3 IMPLANT
LEAD PACING MYOCARDI (MISCELLANEOUS) ×4 IMPLANT
MARKER GRAFT CORONARY BYPASS (MISCELLANEOUS) ×9 IMPLANT
NS IRRIG 1000ML POUR BTL (IV SOLUTION) ×15 IMPLANT
PACK ACCESSORY CANNULA KIT (KITS) ×3 IMPLANT
PACK E OPEN HEART (SUTURE) ×3 IMPLANT
PACK OPEN HEART (CUSTOM PROCEDURE TRAY) ×3 IMPLANT
PAD ARMBOARD 7.5X6 YLW CONV (MISCELLANEOUS) ×6 IMPLANT
PAD ELECT DEFIB RADIOL ZOLL (MISCELLANEOUS) ×3 IMPLANT
PENCIL BUTTON HOLSTER BLD 10FT (ELECTRODE) ×3 IMPLANT
POSITIONER HEAD DONUT 9IN (MISCELLANEOUS) ×3 IMPLANT
PUNCH AORTIC ROTATE 4.0MM (MISCELLANEOUS) ×3 IMPLANT
SET MPS 3-ND DEL (MISCELLANEOUS) ×1 IMPLANT
SPONGE T-LAP 18X18 ~~LOC~~+RFID (SPONGE) ×4 IMPLANT
SUPPORT HEART JANKE-BARRON (MISCELLANEOUS) ×3 IMPLANT
SUT BONE WAX W31G (SUTURE) ×3 IMPLANT
SUT ETHIBOND X763 2 0 SH 1 (SUTURE) ×6 IMPLANT
SUT MNCRL AB 3-0 PS2 18 (SUTURE) ×6 IMPLANT
SUT PDS AB 1 CTX 36 (SUTURE) ×6 IMPLANT
SUT PROLENE 4 0 RB 1 (SUTURE) ×3
SUT PROLENE 4 0 SH DA (SUTURE) ×3 IMPLANT
SUT PROLENE 4-0 RB1 .5 CRCL 36 (SUTURE) IMPLANT
SUT PROLENE 5 0 C 1 36 (SUTURE) ×11 IMPLANT
SUT PROLENE 7 0 BV1 MDA (SUTURE) ×3 IMPLANT
SUT STEEL 6MS V (SUTURE) ×8 IMPLANT
SUT VIC AB 2-0 CT1 27 (SUTURE) ×3
SUT VIC AB 2-0 CT1 TAPERPNT 27 (SUTURE) IMPLANT
SUT VIC AB 3-0 X1 27 (SUTURE) ×1 IMPLANT
SYSTEM SAHARA CHEST DRAIN ATS (WOUND CARE) ×3 IMPLANT
TAPE CLOTH SURG 4X10 WHT LF (GAUZE/BANDAGES/DRESSINGS) ×1 IMPLANT
TAPE PAPER 2X10 WHT MICROPORE (GAUZE/BANDAGES/DRESSINGS) ×1 IMPLANT
TOWEL GREEN STERILE (TOWEL DISPOSABLE) ×3 IMPLANT
TOWEL GREEN STERILE FF (TOWEL DISPOSABLE) ×3 IMPLANT
TRAY FOLEY SLVR 16FR TEMP STAT (SET/KITS/TRAYS/PACK) ×3 IMPLANT
UNDERPAD 30X36 HEAVY ABSORB (UNDERPADS AND DIAPERS) ×3 IMPLANT
WATER STERILE IRR 1000ML POUR (IV SOLUTION) ×6 IMPLANT

## 2021-11-12 SURGICAL SUPPLY — 7 items
BALLN IABP SENSA PLUS 8F 50CC (BALLOONS) ×4
BALLOON IABP SENS PLUS 8F 50CC (BALLOONS) IMPLANT
KIT MICROPUNCTURE NIT STIFF (SHEATH) ×1 IMPLANT
MAT PREVALON FULL STRYKER (MISCELLANEOUS) ×1 IMPLANT
PACK CARDIAC CATHETERIZATION (CUSTOM PROCEDURE TRAY) ×2 IMPLANT
SHEATH PINNACLE 5F 10CM (SHEATH) ×1 IMPLANT
WIRE EMERALD 3MM-J .035X150CM (WIRE) ×1 IMPLANT

## 2021-11-12 SURGICAL SUPPLY — 19 items
BALLN SAPPHIRE 2.0X12 (BALLOONS) ×2
BALLOON SAPPHIRE 2.0X12 (BALLOONS) IMPLANT
CATH INFINITI 5FR ANG PIGTAIL (CATHETERS) ×1 IMPLANT
CATH OPTITORQUE TIG 4.0 5F (CATHETERS) ×1 IMPLANT
CATH VISTA GUIDE 6FR XB3.5 (CATHETERS) ×1 IMPLANT
DEVICE RAD COMP TR BAND LRG (VASCULAR PRODUCTS) ×1 IMPLANT
ELECT DEFIB PAD ADLT CADENCE (PAD) ×1 IMPLANT
GLIDESHEATH SLEND SS 6F .021 (SHEATH) ×1 IMPLANT
GUIDEWIRE INQWIRE 1.5J.035X260 (WIRE) IMPLANT
INQWIRE 1.5J .035X260CM (WIRE) ×2
KIT ENCORE 26 ADVANTAGE (KITS) ×1 IMPLANT
KIT HEART LEFT (KITS) ×2 IMPLANT
PACK CARDIAC CATHETERIZATION (CUSTOM PROCEDURE TRAY) ×2 IMPLANT
SHEATH PROBE COVER 6X72 (BAG) ×1 IMPLANT
TRANSDUCER W/STOPCOCK (MISCELLANEOUS) ×2 IMPLANT
TUBING CIL FLEX 10 FLL-RA (TUBING) ×2 IMPLANT
WIRE ASAHI PROWATER 180CM (WIRE) ×1 IMPLANT
WIRE HI TORQ WHISPER MS 190CM (WIRE) ×1 IMPLANT
WIRE PT2 MS 185 (WIRE) ×1 IMPLANT

## 2021-11-12 NOTE — Progress Notes (Signed)
To lab 3 for IABP insertion

## 2021-11-12 NOTE — H&P (View-Only) (Signed)
[address]       [place] [address]             [phone]                    Jian E Turpin [place] Medical Record [id number] Date of Birth: Sep 09, 1951  Referring: No ref. provider found Primary Care: [name], MD Primary Cardiologist: None  Chief Complaint:    Chief Complaint  Patient presents with   Loss of Consciousness   Chest Pain    History of Present Illness:    Dean Mendez 70 y.o. male transferred from [facility] with an NSTEMI.  He is also been having syncopal episodes.  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  CTS was consulted.  He is currently having chest pain.    Past Medical History:  Diagnosis Date   Hypertension     History reviewed. No pertinent surgical history.  Family History  Problem Relation Age of Onset   Heart attack Brother      Social History   Tobacco Use  Smoking Status Every Day   Packs/day: 0.25   Types: Cigarettes  Smokeless Tobacco Never    Social History   Substance and Sexual Activity  Alcohol Use Not Currently     No Known Allergies  Current Facility-Administered Medications  Medication Dose Route Frequency Provider Last Rate Last Admin   0.9% sodium chloride infusion  1 mL/kg/hr Intravenous Continuous Bernerd Pho M, PA-C 89.8 mL/hr at 11/12/21 1103 1 mL/kg/hr at 11/12/21 1103   0.9% sodium chloride infusion  1 mL/kg/hr Intravenous Continuous [name], MD       [facility] Hold] aspirin EC tablet 81 mg  81 mg Oral Daily Adefeso, Oladapo, DO   81 mg at 11/12/21 0844   [MAR Hold] atorvastatin (LIPITOR) tablet 40 mg  40 mg Oral Daily Emokpae, Courage, MD   40 mg at 11/12/21 0844   fentaNYL (SUBLIMAZE) injection    PRN [name], MD   50 mcg at 11/12/21 1744   Heparin (Porcine) in NaCl 1000-0.9 UT/500ML-% SOLN    PRN [name], MD   500 mL at 11/12/21 1701   heparin ADULT infusion 100  units/mL (25000 units/253mL)  1,100 Units/hr Intravenous Continuous Adefeso, Oladapo, DO   Stopped at 11/12/21 1552   heparin sodium (porcine) injection    PRN [name], MD   5,000 Units at 11/12/21 1650   iohexol (OMNIPAQUE) 350 MG/ML injection    PRN [name], MD   105 mL at 11/12/21 1800   [MAR Hold] isosorbide mononitrate (IMDUR) 24 hr tablet 30 mg  30 mg Oral Daily Emokpae, Courage, MD   30 mg at 11/12/21 0844   lidocaine (PF) (XYLOCAINE) 1 % injection    PRN [name], MD   2 mL at 11/12/21 1621   [MAR Hold] lisinopril (ZESTRIL) tablet 20 mg  20 mg Oral Daily [name], RPH   20 mg at 11/12/21 0845   [MAR Hold] metoprolol tartrate (LOPRESSOR) tablet 12.5 mg  12.5 mg Oral BID [name], MD   12.5 mg at 11/12/21 0845   midazolam (VERSED) injection    PRN [name], MD   2 mg at 11/12/21 1618   [MAR Hold] nitroGLYCERIN (NITROSTAT) SL tablet 0.4 mg  0.4 mg Sublingual Q5 min PRN [name],  DO       nitroGLYCERIN (NITROSTAT) SL tablet    PRN [name], MD   0.4 mg at 11/12/21 1744   nitroGLYCERIN 50 mg in dextrose 5 % 250 mL (0.2 mg/mL) infusion    Continuous PRN [name], MD 1.5 mL/hr at 11/12/21 1750 5 mcg/min at 11/12/21 1750   Radial Cocktail/Verapamil only    PRN [name], MD   10 mL at 11/12/21 1622   ticagrelor (BRILINTA) tablet    PRN [name], MD   180 mg at 11/12/21 1700   tirofiban (AGGRASTAT) bolus via infusion    PRN [name], MD   2,245 mcg at 11/12/21 1653   tirofiban (AGGRASTAT) infusion 50 mcg/mL 100 mL    Continuous PRN [name], MD   Stopped at 11/12/21 1743    Review of Systems  Respiratory:  Positive for shortness of breath.   Cardiovascular:  Positive for chest pain.   PHYSICAL EXAMINATION: BP (!) 146/103   Pulse (!) 58   Temp 97.7 F (36.5 C) (Oral)   Resp 20   Ht 6' (1.829 m)   Wt 89.8 kg   SpO2 99%   BMI 26.85 kg/m   Physical Exam Constitutional:       Appearance: He is ill-appearing.  Cardiovascular:     Rate and Rhythm: Regular rhythm. Bradycardia present.  Pulmonary:     Effort: Pulmonary effort is normal.  Neurological:     Mental Status: He is alert.     Diagnostic Studies & Laboratory data:     Recent Radiology Findings:   DG Chest 2 View  Result Date: 11/11/2021 CLINICAL DATA:  Chest pain, syncope EXAM: CHEST - 2 VIEW COMPARISON:  Previous studies including the CT chest done on 07/06/2019 FINDINGS: Cardiac size is within normal limits. Thoracic aorta is tortuous and ectatic. There are no signs of alveolar pulmonary edema or focal pulmonary consolidation. Centrilobular and panlobular emphysema is seen in the upper lung fields. Linear density in the medial right lower lung fields most likely suggests minimal scarring. There is no pleural effusion or pneumothorax. IMPRESSION: COPD. There are no new infiltrates or signs of pulmonary edema. Linear density in the medial right lower lung fields most likely suggests scarring. Electronically Signed   By: Elmer Picker M.D.   On: 11/11/2021 13:16   CT Angio Chest PE W and/or Wo Contrast  Result Date: 11/11/2021 CLINICAL DATA:  PE suspected, high prob Chest pain and shortness of breath after fall earlier today. EXAM: CT ANGIOGRAPHY CHEST WITH CONTRAST TECHNIQUE: Multidetector CT imaging of the chest was performed using the standard protocol during bolus administration of intravenous contrast. Multiplanar CT image reconstructions and MIPs were obtained to evaluate the vascular anatomy. CONTRAST:  15mL OMNIPAQUE IOHEXOL 350 MG/ML SOLN COMPARISON:  Radiograph earlier today.  Chest CT 07/06/2019 FINDINGS: Cardiovascular: There are no filling defects within the pulmonary arteries to suggest pulmonary embolus. Atherosclerosis of the thoracic aorta which is tortuous. No aneurysm or acute aortic findings. The heart is normal in size. There are coronary artery calcifications. No pericardial effusion.  Mediastinum/Nodes: No enlarged mediastinal or hilar lymph nodes. No esophageal wall thickening. No visualized thyroid nodule Lungs/Pleura: Advanced emphysema. Bandlike atelectasis or scarring in the right lower lobe. Subsegmental atelectasis in the left lower lobe. No acute airspace disease. No pleural effusion. No pneumothorax. There is retained mucus within the dependent trachea. No dominant pulmonary mass. Previous perifissural lingular nodule has resolved in the interim.  Upper Abdomen: No acute findings.  No upper abdominal free fluid. Musculoskeletal: No acute rib fracture. No acute osseous abnormalities are seen. Thoracic spondylosis with endplate spurring. No confluent chest wall contusion. Review of the MIP images confirms the above findings. IMPRESSION: 1. No pulmonary embolus or acute intrathoracic abnormality. 2. Moderately advanced emphysema. Minimal retained mucus in the trachea. 3. Aortic atherosclerosis.  Coronary artery calcifications. Aortic Atherosclerosis (ICD10-I70.0) and Emphysema (ICD10-J43.9). Electronically Signed   By: Narda Rutherford M.D.   On: 11/11/2021 20:14   ECHOCARDIOGRAM COMPLETE  Result Date: 11/12/2021    ECHOCARDIOGRAM REPORT   Patient Name:   ATTICUS WEDIN Date of Exam: 11/12/2021 Medical Rec #:  [id number]             Height:       72.0 in Accession #:    [id number]            Weight:       198.0 lb Date of Birth:  [birth year]             BSA:          2.121 m Patient Age:    70 years              BP:           168/97 mmHg Patient Gender: M                     HR:           67 bpm. [facility] Procedure: 2D Echo, Cardiac Doppler and Color Doppler Indications:    Chest Pain  History:        Patient has no prior history of Echocardiogram examinations.                 Signs/Symptoms:Chest Pain and Syncope; Risk Factors:Hypertension                 and Dyslipidemia.  Sonographer:    Mikki Harbor Referring Phys: [id number] [name] IMPRESSIONS  1.  Left ventricular ejection fraction, by estimation, is 60 to 65%. The left ventricle has normal function. The left ventricle has no regional wall motion abnormalities. There is moderate left ventricular hypertrophy. Left ventricular diastolic parameters are consistent with Grade I diastolic dysfunction (impaired relaxation).  2. Right ventricular systolic function is normal. The right ventricular size is normal. There is normal pulmonary artery systolic pressure.  3. The mitral valve is normal in structure. No evidence of mitral valve regurgitation. No evidence of mitral stenosis.  4. The aortic valve is tricuspid. Aortic valve regurgitation is not visualized. No aortic stenosis is present.  5. The inferior vena cava is normal in size with greater than 50% respiratory variability, suggesting right atrial pressure of 3 mmHg. FINDINGS  Left Ventricle: Left ventricular ejection fraction, by estimation, is 60 to 65%. The left ventricle has normal function. The left ventricle has no regional wall motion abnormalities. The left ventricular internal cavity size was normal in size. There is  moderate left ventricular hypertrophy. Left ventricular diastolic parameters are consistent with Grade I diastolic dysfunction (impaired relaxation). Normal left ventricular filling pressure. Right Ventricle: The right ventricular size is normal. No increase in right ventricular wall thickness. Right ventricular systolic function is normal. There is normal pulmonary artery systolic pressure. The tricuspid regurgitant velocity is 2.17 m/s, and  with an assumed right atrial pressure of 3 mmHg, the estimated right ventricular systolic pressure is 21.8 mmHg.  Left Atrium: Left atrial size was normal in size. Right Atrium: Right atrial size was normal in size. Pericardium: There is no evidence of pericardial effusion. Mitral Valve: The mitral valve is normal in structure. No evidence of mitral valve regurgitation. No evidence of mitral valve  stenosis. MV peak gradient, 2.8 mmHg. The mean mitral valve gradient is 1.0 mmHg. Tricuspid Valve: The tricuspid valve is normal in structure. Tricuspid valve regurgitation is mild . No evidence of tricuspid stenosis. Aortic Valve: The aortic valve is tricuspid. Aortic valve regurgitation is not visualized. No aortic stenosis is present. Aortic valve mean gradient measures 5.0 mmHg. Aortic valve peak gradient measures 8.8 mmHg. Aortic valve area, by VTI measures 2.20 cm. Pulmonic Valve: The pulmonic valve was not well visualized. Pulmonic valve regurgitation is not visualized. No evidence of pulmonic stenosis. Aorta: The aortic root is normal in size and structure. Venous: The inferior vena cava is normal in size with greater than 50% respiratory variability, suggesting right atrial pressure of 3 mmHg. IAS/Shunts: No atrial level shunt detected by color flow Doppler.  LEFT VENTRICLE PLAX 2D LVIDd:         3.90 cm     Diastology LVIDs:         2.20 cm     LV e' medial:    5.66 cm/s LV PW:         1.40 cm     LV E/e' medial:  13.4 LV IVS:        1.50 cm     LV e' lateral:   7.72 cm/s LVOT diam:     2.00 cm     LV E/e' lateral: 9.8 LV SV:         71 LV SV Index:   33 LVOT Area:     3.14 cm  LV Volumes (MOD) LV vol d, MOD A2C: 59.9 ml LV vol d, MOD A4C: 59.2 ml LV vol s, MOD A2C: 28.4 ml LV vol s, MOD A4C: 24.1 ml LV SV MOD A2C:     31.5 ml LV SV MOD A4C:     59.2 ml LV SV MOD BP:      33.2 ml RIGHT VENTRICLE RV Basal diam:  3.70 cm RV Mid diam:    3.40 cm RV S prime:     11.30 cm/s TAPSE (M-mode): 2.5 cm LEFT ATRIUM             Index        RIGHT ATRIUM           Index LA diam:        3.30 cm 1.56 cm/m   RA Area:     16.50 cm LA Vol (A2C):   35.8 ml 16.88 ml/m  RA Volume:   48.00 ml  22.63 ml/m LA Vol (A4C):   37.9 ml 17.86 ml/m LA Biplane Vol: 36.6 ml 17.25 ml/m  AORTIC VALVE                     PULMONIC VALVE AV Area (Vmax):    1.97 cm      PV Vmax:       0.71 m/s AV Area (Vmean):   2.07 cm      PV Peak  grad:  2.0 mmHg AV Area (VTI):     2.20 cm AV Vmax:           148.00 cm/s AV Vmean:          106.000 cm/s AV  VTI:            0.322 m AV Peak Grad:      8.8 mmHg AV Mean Grad:      5.0 mmHg LVOT Vmax:         92.70 cm/s LVOT Vmean:        69.700 cm/s LVOT VTI:          0.225 m LVOT/AV VTI ratio: 0.70  AORTA Ao Root diam: 3.20 cm MITRAL VALVE               TRICUSPID VALVE MV Area (PHT): 4.29 cm    TR Peak grad:   18.8 mmHg MV Area VTI:   3.55 cm    TR Vmax:        217.00 cm/s MV Peak grad:  2.8 mmHg MV Mean grad:  1.0 mmHg    SHUNTS MV Vmax:       0.84 m/s    Systemic VTI:  0.22 m MV Vmean:      51.8 cm/s   Systemic Diam: 2.00 cm MV Decel Time: 177 msec MV E velocity: 75.80 cm/s MV A velocity: 87.40 cm/s MV E/A ratio:  0.87 Carlyle Dolly MD Electronically signed by Carlyle Dolly MD Signature Date/Time: 11/12/2021/10:25:53 AM    Final        I have independently reviewed the above radiology studies  and reviewed the findings with the patient.   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  He has diffuse disease in all territories.  It appears as though he is has clot in his ramus.  He is having ongoing chest pain.  Despite receiving Aggrastat and Brilinta the patient will be taken emergently to the operating room for bypass.      Lajuana Matte 11/12/2021 6:42 PM

## 2021-11-12 NOTE — Progress Notes (Signed)
Dr. Herbie Baltimore and Dr. Cliffton Asters at bedside talking w/patient. STAT 2D echo being done

## 2021-11-12 NOTE — Brief Op Note (Signed)
11/12/2021  5:58 PM  PROCEDURE:  Procedure(s): LEFT HEART CATH AND CORONARY ANGIOGRAPHY (N/A) CORONARY BALLOON ANGIOPLASTY (N/A)  SURGEON:  Surgeon(s) and Role:    * [name], MD - Primary   PATIENT:  Dean Mendez  70 y.o. 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  The downstream vessel has pruning with what appears to be possible distal embolization. LCx is an unusual takeoff, there is diffuse moderate 70% disease followed by a hairpin turn where there is apparently an occluded OM 2 branch.  There is a 99% stenosis at this lesion that is heavily calcified. RCA appears to be nondominant heavily diseased small caliber vessel. Original plan was to attempt PTCA/PCI of the LCx with Aggrastat infusion overnight and planned attempted PCI of the ramus intermedius on Friday. Attempted PTCA of the circumflex was performed using Prowater, Choice PT and whisper wires.  The vessel was very difficult to wire and was not able to reach beyond the lesion.  Imaging after wire removal revealed that the vessel was now no longer subtotally occluded but appeared to have total occlusion with TIMI I flow distally.  At this point I chose to abort for further procedures. On continued to of post wiring angiography, there appears to be dye hang up in the left main into the aortic root concerning for possible dissection.   Aggrastat bolus had been given but was discontinued.  CVTS consult called for potential emergent surgery.   EF appears to be 45 to 50%.  There appears to be apical inferior and mid to distal lateral-inferolateral hypokinesis. Normal  LVEDP.   PROCEDURE PERFORMED:  Time Out: Verified patient identification, verified procedure, site/side was marked, verified correct patient position, special equipment/implants available, medications/allergies/relevent history reviewed, required imaging and test results available. Performed.  Access:  RADIAL Radial Artery: 6 Fr sheath -- Seldinger technique using Micropuncture Kit -- Direct ultrasound guidance used.  Permanent image obtained and placed on chart. -- 10 mL radial cocktail IA; 4500 Units IV Heparin  Left Heart Catheterization: 5&6Fr Catheters advanced or exchanged over a J-wire under direct fluoroscopic guidance into the ascending aorta; TIG 4.0 catheter advanced first.  * LV Hemodynamics (LV Gram): Angled pigtail catheter * Left & Right Coronary Artery Cineangiography: TIG 4.0 catheter catheter   Review of initial angiography revealed: Severe multivessel disease as noted above.  After discussing with Dr. Martinique, we decided that we would proceed with attempted PCI of the LCx with plans to run Aggrastat overnight and come return in 2 days to attempt RI PCI.  Preparations are made for LCx PCI  Attempted LCx PCI: 6 French XB LAD guide catheter advanced over wire and used to engage left main.  Several wires including Prowater, Choice PT and whisper wires were attempted to used to cross the lesion unfortunately, neither wire was able to go beyond the hairpin turn in the circumflex as a goes into the AV groove.  After multiple wire attempts I chose to abort.  Wires were removed.  Post attempt imaging revealed now subtotal occlusion with TIMI I flow distally of the LCx.  Also noted dye hang up in the left main into the aorta concerning for  dissection.  Upon completion of Angiogaphy, the catheter was removed completely out of the body over a wire, without complication.  Radial sheath removed in the Cardiac Catheterization lab with TR Band placed for hemostasis.  TR Band: 1740  Hours; 16 mL air  MEDICATIONS SQ Lidocaine 3 mL Radial Cocktail: 3 mg Verapmil in 10 mL NS Heparin: Total 9000 units SQ nitroglycerin 4 mcg; nitroglycerin drip infusion initiated Brilinta 100 mg p.o. Aggrastat bolus with no infusion.   ANESTHESIA:   local and IV sedation 2 mg Versed, 75 mg fentanyl, 4 mcg IV NTG  EBL:  < 50 mL   MEDICATIONS USED: 105 mL; Brilinta 100 mg p.o. administered along with Aggrastat bolus but no infusion.  Infusion was never started after bolus.  DICTATION: .Note written in [place]:  The patient has been already been admitted to 6 E., however would likely go to the OR.  PATIENT DISPOSITION:  PACU - hemodynamically stable. Stat echocardiogram called CVTS consult call with Dr. Kipp Brood -> likely emergent surgery.  Would like to consider CABG. Antiplatelet agents have not stopped.   Delay start of Pharmacological VTE agent (>24hrs) due to surgical blood loss or risk of bleeding: not applicable    Glenetta Hew, MD

## 2021-11-12 NOTE — Progress Notes (Signed)
ANTICOAGULATION CONSULT NOTE  Pharmacy Consult for Heparin Indication: chest pain/ACS  No Known Allergies  Patient Measurements: Height: 6' (182.9 cm) Weight: 89.8 kg (198 lb) IBW/kg (Calculated) : 77.6  Heparin Dosing Weight: 90 kg  Vital Signs: Temp: 98.2 F (36.8 C) (11/23 0228) Temp Source: Oral (11/23 0228) BP: 131/81 (11/23 0228) Pulse Rate: 62 (11/23 0228)  Labs: Recent Labs    11/11/21 1308 11/11/21 1541 11/12/21 0149  HGB 16.0  --  15.7  HCT 49.8  --  47.1  PLT 270  --  233  APTT  --   --  73*  HEPARINUNFRC  --   --  0.52  CREATININE 1.34*  --  1.04  TROPONINIHS 13 77*  --      Estimated Creatinine Clearance: 74.6 mL/min (by C-G formula based on SCr of 1.04 mg/dL).   Medical History: Past Medical History:  Diagnosis Date   Hypertension     Assessment: 79 YOM with history significant for hypertension, CKD, and hyperlipidemia presented with syncope and chest pain. Pharmacy consulted to initiate heparin for ACS.  Patient is not on anticoagulation PTA. CBC stable, no concern for bleeding at this time.  11/23 AM update:  Heparin level therapeutic  Goal of Therapy:  Heparin level 0.3-0.7 units/ml Monitor platelets by anticoagulation protocol: Yes   Plan:  Cont heparin at 1100 units/hr 1200 heparin level  Abran Duke, PharmD, BCPS Clinical Pharmacist Phone: [phone]

## 2021-11-12 NOTE — Progress Notes (Signed)
Date and time results received: 11/12/21 0715 (use smartphrase ".now" to insert current time)  Test: Troponin Critical Value: 4175  Name of Provider Notified: Courage  Orders Received? Or Actions Taken?: Awaiting response

## 2021-11-12 NOTE — Progress Notes (Signed)
Date and time results received: 11/12/21 0254 (use smartphrase ".now" to insert current time)  Test: Trop Critical Value: 3676  Name of Provider Notified: [place]  Orders Received? Or Actions Taken?:  Awaiting response

## 2021-11-12 NOTE — Anesthesia Procedure Notes (Signed)
Procedure Name: Intubation Date/Time: 11/12/2021 8:44 PM Performed by: Rande Brunt, CRNA Pre-anesthesia Checklist: Patient identified, Emergency Drugs available, Suction available and Patient being monitored Patient Re-evaluated:Patient Re-evaluated prior to induction Oxygen Delivery Method: Circle System Utilized Preoxygenation: Pre-oxygenation with 100% oxygen Induction Type: IV induction Ventilation: Mask ventilation without difficulty Laryngoscope Size: Mac and 4 Grade View: Grade I Tube type: Oral Tube size: 8.0 mm Number of attempts: 1 Airway Equipment and Method: Stylet and Oral airway Placement Confirmation: ETT inserted through vocal cords under direct vision, positive ETCO2 and breath sounds checked- equal and bilateral Secured at: 22 cm Tube secured with: Tape Dental Injury: Teeth and Oropharynx as per pre-operative assessment

## 2021-11-12 NOTE — Interval H&P Note (Signed)
History and Physical Interval Note:  11/12/2021 7:15 PM  Dean Mendez  has presented today for surgery, with the diagnosis of NSTEMI - LM Dissection - pre-OR.  The various methods of treatment have been discussed with the patient and family. After consideration of risks, benefits and other options for treatment, the patient has consented to  Procedure(s):  INTRAAORTIC BALLOON PUMP    as a surgical intervention.  The patient's history has been reviewed, patient examined, no change in status, stable for surgery.  I have reviewed the patient's chart and labs.  Questions were answered to the patient's satisfaction.     Bryan Lemma

## 2021-11-12 NOTE — Progress Notes (Signed)
**Note De-identified Palmer Fahrner Obfuscation** EKG complete and placed in patient chart 

## 2021-11-12 NOTE — Progress Notes (Signed)
  Echocardiogram 2D Echocardiogram has been performed.  Delcie Roch 11/12/2021, 6:56 PM

## 2021-11-12 NOTE — Brief Op Note (Signed)
11/11/2021 - 11/12/2021  11:14 PM  PATIENT:  Dean Mendez  70 y.o. male  PRE-OPERATIVE DIAGNOSIS:  Coronary artery disease, Acute left main coronary artery dissection, Acute NSTEMI  POST-OPERATIVE DIAGNOSIS:  Coronary artery disease, Acute left main coronary artery dissection, Acute NSTEMI  PROCEDURE:   EMERGENCY CORONARY ARTERY BYPASS GRAFTING (CABG) X TWO,   LIMA->LAD  SVG->Ramus Int.   Open saphenous vein harvest left lower leg.  Vein harvest time ,  Prep time .   TRANSESOPHAGEAL ECHOCARDIOGRAM   SURGEON:   Corliss Skains, MD - Primary  PHYSICIAN ASSISTANT: Abubakar Crispo  ASSISTANTS: Ferne Coe, RN, RN First Assistant   ANESTHESIA:   general  EBL:  Per anesthesia and perfusion records  DRAINS:  Left pleural and mediastinal drains    LOCAL MEDICATIONS USED:  NONE  SPECIMEN:  No Specimen  DISPOSITION OF SPECIMEN:  N/A  COUNTS:  YES  DICTATION: .Dragon Dictation  PLAN OF CARE: Admit to inpatient   PATIENT DISPOSITION:  ICU - intubated and hemodynamically stable.   Delay start of Pharmacological VTE agent (>24hrs) due to surgical blood loss or risk of bleeding: yes

## 2021-11-12 NOTE — Progress Notes (Signed)
  I was notified by cardiology Team that pt has ruled in for acute MI and has been transferred to cardiology service - Patient seen and evaluated, chart reviewed, please see EMR for updated orders. Please see full H&P dictated by admitting physician Dr Thomes Dinning for same date of service.   Pt was neither seen nor evaluated by me on 11/12/21  Hospitalist service will sign off as of 11/12/21 - Please recall [place] if any further concerns   No charge Note  Shon Hale, MD

## 2021-11-12 NOTE — Progress Notes (Signed)
ANTICOAGULATION CONSULT NOTE  Pharmacy Consult for Heparin Indication: chest pain/ACS  No Known Allergies  Patient Measurements: Height: 6' (182.9 cm) Weight: 89.8 kg (198 lb) IBW/kg (Calculated) : 77.6  Heparin Dosing Weight: 90 kg  Vital Signs: Temp: 97.7 F (36.5 C) (11/23 0840) Temp Source: Oral (11/23 0840) BP: 136/82 (11/23 0840) Pulse Rate: 77 (11/23 0840)  Labs: Recent Labs    11/11/21 1308 11/11/21 1541 11/12/21 0149 11/12/21 0602 11/12/21 1215  HGB 16.0  --  15.7  --   --   HCT 49.8  --  47.1  --   --   PLT 270  --  233  --   --   APTT  --   --  73*  --   --   HEPARINUNFRC  --   --  0.52  --  0.49  CREATININE 1.34*  --  1.04  --   --   TROPONINIHS 13 77* 3,676* 4,175*  --      Estimated Creatinine Clearance: 74.6 mL/min (by C-G formula based on SCr of 1.04 mg/dL).   Medical History: Past Medical History:  Diagnosis Date   Hypertension     Assessment: 89 YOM with history significant for hypertension, CKD, and hyperlipidemia presented with syncope and chest pain. Pharmacy consulted to initiate heparin for ACS. Patient with acute MI  Patient is not on anticoagulation PTA. CBC stable, no concern for bleeding at this time.  HL 0.49, remains therapeutic.   Goal of Therapy:  Heparin level 0.3-0.7 units/ml Monitor platelets by anticoagulation protocol: Yes   Plan:  Continue heparin infusion at 1100 units/hr Check heparin level daily while on heparin Continue to monitor H&H and platelets  Elder Cyphers, BS Loura Back, BCPS Clinical Pharmacist Pager [phone]

## 2021-11-12 NOTE — ED Notes (Signed)
Report attempted x 1

## 2021-11-12 NOTE — Progress Notes (Signed)
Received from [facility], patient from [facility]. Arrived on heparin drip at 11cc/hr w/0.9NS at 90cc/hr to rt arm

## 2021-11-12 NOTE — Consult Note (Addendum)
Cardiology Consultation:   Patient ID: FILLIP BOYADJIAN MRN: [id number]; DOB: [birth year]  Admit date: 11/11/2021 Date of Consult: 11/12/2021  PCP:  [name], MD   [facility] Cardiologist: New to [facility] - Dr. Harl Bowie  Patient Profile:   JOSHWA LOEBS is a 70 y.o. male with a past medical history of HTN, HLD, tobacco use and family history of CAD who is being seen 11/12/2021 for the evaluation of NSTEMI at the request of Dr. Josephine Cables.  History of Present Illness:   Mr. Glassco presented to [facility] ED on 11/11/2021 for evaluation of a syncopal episode and chest pain. He reports being in his usual state of health until yesterday morning when he developed acute dizziness and had a syncopal episode in his kitchen. Says this was witnessed by his wife and he lost consciousness for less than 1 minute. He did not have any seizure-like activity. Reports having presyncopal episodes in the past but nothing in the past year. About 30 minutes after his episode, he developed a tightness along his left pectoral region which prompted him to seek evaluation. No radiating pain.  He denies any associated nausea, vomiting or diaphoresis. Does report dyspnea at that time. He is unaware of any personal history of CAD or CHF. Reports his brother died of an MI in his 70's and another brother has multiple stents and is in his 35's.  He is a current smoker and has smoked 0.5 ppd since age 8. No current alcohol use.   Initial labs show WBC 9.7, Hgb 16.0, platelets 270, Na+ 139, K+ 3.3 and creatinine 1.34. COVID negative. Initial Hs Troponin 13 with repeat values of 77, 3676 and 4175. CXR with COPD and linear density in the medial right lower lung fields. CTA showing no evidence of a PE but noted to have advanced emphysema. EKG shows NSR, HR 66 with slight ST elevation along the inferior leads and no prior tracings available for comparison.   He has been started on Heparin for  anticoagulation along with ASA, Imdur, beta-blocker and statin therapy.  He is pain-free at this time  Past Medical History:  Diagnosis Date   Hypertension     History reviewed. No pertinent surgical history.   Home Medications:  Prior to Admission medications   Medication Sig Start Date End Date Taking? Authorizing Provider  CARTIA XT 300 MG 24 hr capsule Take 300 mg by mouth daily. 05/29/19  Yes [provider]  lisinopril-hydrochlorothiazide (ZESTORETIC) 20-12.5 MG tablet Take 1 tablet by mouth daily. 06/01/19  Yes [provider]  pravastatin (PRAVACHOL) 20 MG tablet Take 20 mg by mouth daily. 05/29/19  Yes [provider]  chlorhexidine (PERIDEX) 0.12 % solution 15 mLs 2 (two) times daily. 10/23/19   [provider]  HYDROcodone-acetaminophen (NORCO/VICODIN) 5-325 MG tablet Take 1 tablet by mouth every 4 (four) hours as needed. 10/23/19   [provider]  ibuprofen (ADVIL) 400 MG tablet Take 400 mg by mouth every 6 (six) hours as needed. 10/23/19   [provider]    Inpatient Medications: Scheduled Meds:  aspirin EC  81 mg Oral Daily   atorvastatin  40 mg Oral Daily   isosorbide mononitrate  30 mg Oral Daily   lisinopril  20 mg Oral Daily   metoprolol tartrate  12.5 mg Oral BID   Continuous Infusions:  sodium chloride 3 mL/kg/hr (11/12/21 0847)   Followed by   sodium chloride     heparin 1,100 Units/hr (11/12/21 0247)  PRN Meds: nitroGLYCERIN  Allergies:   No Known Allergies  Social History:   Social History   Socioeconomic History   Marital status: Married    Spouse name: Not on file   Number of children: Not on file   Years of education: Not on file   Highest education level: Not on file  Occupational History   Not on file  Tobacco Use   Smoking status: Every Day    Packs/day: 0.25    Types: Cigarettes   Smokeless tobacco: Never  Substance and Sexual Activity   Alcohol use: Not Currently   Drug use: Never    Sexual activity: Not on file  Other Topics Concern   Not on file  Social History Narrative   Not on file   Social Determinants of Health   Financial Resource Strain: Not on file  Food Insecurity: Not on file  Transportation Needs: Not on file  Physical Activity: Not on file  Stress: Not on file  Social Connections: Not on file  Intimate Partner Violence: Not on file    Family History:    Family History  Problem Relation Age of Onset   Heart attack Brother      ROS:  Please see the history of present illness.   All other ROS reviewed and negative.     Physical Exam/Data:   Vitals:   11/12/21 0200 11/12/21 0228 11/12/21 0536 11/12/21 0840  BP: 121/75 131/81 (!) 168/97 136/82  Pulse: (!) 59 62 67 77  Resp: 19 18 20 16   Temp:  98.2 F (36.8 C) 98.1 F (36.7 C) 97.7 F (36.5 C)  TempSrc:  Oral Oral Oral  SpO2: 97% 97% 100% 98%  Weight:      Height:        Intake/Output Summary (Last 24 hours) at 11/12/2021 0857 Last data filed at 11/12/2021 0500 Gross per 24 hour  Intake 242.54 ml  Output 500 ml  Net -257.46 ml   Last 3 Weights 11/11/2021 10/26/2019  Weight (lbs) 198 lb 195 lb  Weight (kg) 89.812 kg 88.451 kg     Body mass index is 26.85 kg/m.  General:  Well nourished, well developed male appearing in no acute distress.  HEENT: normal Neck: no JVD Vascular: No carotid bruits; Distal pulses 2+ bilaterally Cardiac:  normal S1, S2; RRR; no murmur Lungs: Scattered rhonchi. No wheezing or rales.  Abd: soft, nontender, no hepatomegaly  Ext: no pitting edema Musculoskeletal:  No deformities, BUE and BLE strength normal and equal Skin: warm and dry  Neuro:  CNs 2-12 intact, no focal abnormalities noted Psych:  Normal affect   EKG:  The EKG was personally reviewed and demonstrates: NSR, HR 66 with slight ST elevation along the inferior leads and no prior tracings available for comparison.   Telemetry:  Telemetry was personally reviewed and demonstrates:  NSR, HR in 60's to 70's with occasional PVC's.   Relevant CV Studies:  Echocardiogram: Pending  Laboratory Data:  High Sensitivity Troponin:   Recent Labs  Lab 11/11/21 1308 11/11/21 1541 11/12/21 0149 11/12/21 0602  TROPONINIHS 13 77* 3,676* 4,175*     Chemistry Recent Labs  Lab 11/11/21 1308 11/12/21 0149  NA 139 136  K 3.3* 3.0*  CL 103 104  CO2 28 24  GLUCOSE 98 127*  BUN 15 13  CREATININE 1.34* 1.04  CALCIUM 9.2 9.2  MG  --  2.1  GFRNONAA 58* >60  ANIONGAP 8 8    Recent Labs  Lab 11/12/21  0149  PROT 6.5  ALBUMIN 3.7  AST 34  ALT 17  ALKPHOS 76  BILITOT 0.6   Lipids No results for input(s): CHOL, TRIG, HDL, LABVLDL, LDLCALC, CHOLHDL in the last 168 hours.  Hematology Recent Labs  Lab 11/11/21 1308 11/12/21 0149  WBC 9.7 7.7  RBC 5.80 5.55  HGB 16.0 15.7  HCT 49.8 47.1  MCV 85.9 84.9  MCH 27.6 28.3  MCHC 32.1 33.3  RDW 14.3 14.2  PLT 270 233   Thyroid No results for input(s): TSH, FREET4 in the last 168 hours.  BNPNo results for input(s): BNP, PROBNP in the last 168 hours.  DDimer No results for input(s): DDIMER in the last 168 hours.   Radiology/Studies:  DG Chest 2 View  Result Date: 11/11/2021 CLINICAL DATA:  Chest pain, syncope EXAM: CHEST - 2 VIEW COMPARISON:  Previous studies including the CT chest done on 07/06/2019 FINDINGS: Cardiac size is within normal limits. Thoracic aorta is tortuous and ectatic. There are no signs of alveolar pulmonary edema or focal pulmonary consolidation. Centrilobular and panlobular emphysema is seen in the upper lung fields. Linear density in the medial right lower lung fields most likely suggests minimal scarring. There is no pleural effusion or pneumothorax. IMPRESSION: COPD. There are no new infiltrates or signs of pulmonary edema. Linear density in the medial right lower lung fields most likely suggests scarring. Electronically Signed   By: Elmer Picker M.D.   On: 11/11/2021 13:16   CT Angio Chest  PE W and/or Wo Contrast  Result Date: 11/11/2021 CLINICAL DATA:  PE suspected, high prob Chest pain and shortness of breath after fall earlier today. EXAM: CT ANGIOGRAPHY CHEST WITH CONTRAST TECHNIQUE: Multidetector CT imaging of the chest was performed using the standard protocol during bolus administration of intravenous contrast. Multiplanar CT image reconstructions and MIPs were obtained to evaluate the vascular anatomy. CONTRAST:  61mL OMNIPAQUE IOHEXOL 350 MG/ML SOLN COMPARISON:  Radiograph earlier today.  Chest CT 07/06/2019 FINDINGS: Cardiovascular: There are no filling defects within the pulmonary arteries to suggest pulmonary embolus. Atherosclerosis of the thoracic aorta which is tortuous. No aneurysm or acute aortic findings. The heart is normal in size. There are coronary artery calcifications. No pericardial effusion. Mediastinum/Nodes: No enlarged mediastinal or hilar lymph nodes. No esophageal wall thickening. No visualized thyroid nodule Lungs/Pleura: Advanced emphysema. Bandlike atelectasis or scarring in the right lower lobe. Subsegmental atelectasis in the left lower lobe. No acute airspace disease. No pleural effusion. No pneumothorax. There is retained mucus within the dependent trachea. No dominant pulmonary mass. Previous perifissural lingular nodule has resolved in the interim. Upper Abdomen: No acute findings.  No upper abdominal free fluid. Musculoskeletal: No acute rib fracture. No acute osseous abnormalities are seen. Thoracic spondylosis with endplate spurring. No confluent chest wall contusion. Review of the MIP images confirms the above findings. IMPRESSION: 1. No pulmonary embolus or acute intrathoracic abnormality. 2. Moderately advanced emphysema. Minimal retained mucus in the trachea. 3. Aortic atherosclerosis.  Coronary artery calcifications. Aortic Atherosclerosis (ICD10-I70.0) and Emphysema (ICD10-J43.9). Electronically Signed   By: Keith Rake M.D.   On: 11/11/2021  20:14     Assessment and Plan:   1. NSTEMI - Presented with a syncopal episode followed by chest pain and found to have an NSTEMI with most recent Hs Troponin at 4175. EKG shows NSR, HR 66 with slight ST elevation along the inferior leads and no prior tracings available for comparison. Repeat is pending for this AM. Echo also pending. Will add-on FLP  and Hgb A1c to AM labs.  - Reviewed a cardiac catheterization with the patient and he is in agreement to proceed. The patient understands that risks include but are not limited to stroke (1 in 1000), death (1 in 51), kidney failure [usually temporary] (1 in 500), bleeding (1 in 200), allergic reaction [possibly serious] (1 in 200). Will add to the cath board for later today.  - Continue Heparin, ASA 81mg  daily, Atorvastatin 40mg  daily, Lopressor 12.5mg  BID and Imdur 30mg  daily.   2. Syncopal Episode - Occurred prior to his chest pain and possibly ischemic related due to an arrhythmia in the setting of his NSTEMI. No arrhythmias documented on telemetry thus far. Continue to follow. Echocardiogram pending for assessment of EF and wall motion.   3. HTN - His BP was at 136/82 on most recent check. Continue newly started Lopressor and Imdur along with PTA Lisinopril. Will hold HCTZ for now in anticipation of his catheterization.   4. HLD - Will recheck FLP. He was switched to Atorvastatin 40mg  daily by the admitting team.   5. Hypokalemia - K+ at 3.0 this AM. He did receive 40 mEq K-dur this AM. Will order additional 40 mEq prior to cath.   6. Emphysema/Tobacco Use - He has smoked 0.5 ppd since age 72. Cessation advised. CTA this admission shows no evidence of a PE but was noted to have advanced emphysema. Will need to be followed as an outpatient.    Risk Assessment/Risk Scores:     TIMI Risk Score for Unstable Angina or Non-ST Elevation MI:   The patient's TIMI risk score is 5, which indicates a 26% risk of all cause mortality, new or  recurrent myocardial infarction or need for urgent revascularization in the next 14 days.  For questions or updates, please contact [place] Please consult www.[email or web address] for contact info under    Signed, Erma Heritage, PA-C  11/12/2021 8:57 AM  Attending note  Paitent seen and discussed with PA Ahmed Prima, I agree with her documentation. 70 yo male history of HTN, HL admitted with chest pain and syncope. Onset of sudden dizziness yesterday while walking with transient LOC < 1 minute. Shortly after episode developed chest pain/pressure.    K 3.3 Cr 1.34 BUN 15 WBC 9.7 Hgb 16 Plt 270  Trop 13-->77-->3676-->4175 EKG SR, lateral TWIs COVID/flu neg CXR +COPD, no acute finsings CT PE: negative for PE, +coronary atherosclerosis Echo pending    Patient presents with NSTEMI, trop up to 4175, EKG with lateral TWIs, echo pending. Initial medical therapy with hep gtt, ASA 81, atorva 40, lopressor 12.5mg  bid, lisinorpil 20. We will plan for transfer to [facility] today for coronary angiography. Unclear etiology of syncope, given occurred in setting of chest pain perhaps transient ishcemic driven arrhythmia. Telemetry thus far has been benign. CT PE was negative, f/u echo   Carlyle Dolly MD

## 2021-11-12 NOTE — Progress Notes (Signed)
*  PRELIMINARY RESULTS* Echocardiogram 2D Echocardiogram has been performed.  Carolyne Fiscal 11/12/2021, 9:28 AM

## 2021-11-12 NOTE — Anesthesia Preprocedure Evaluation (Addendum)
Anesthesia Evaluation  Patient identified by MRN, date of birth, ID band Patient awake    Reviewed: Allergy & Precautions, NPO status , Patient's Chart, lab work & pertinent test results  Airway Mallampati: II  TM Distance: >3 FB Neck ROM: Full    Dental  (+) Dental Advisory Given   Pulmonary neg pulmonary ROS, Current Smoker,    Pulmonary exam normal breath sounds clear to auscultation       Cardiovascular hypertension, Pt. on medications + CAD and + Past MI  Normal cardiovascular exam Rhythm:Regular Rate:Normal  Echo 11/12/2021  1. Left ventricular ejection fraction, by estimation, is 55 to 60%. The left ventricle has normal function. The left ventricle demonstrates regional wall motion abnormalities (see scoring diagram/findings for description). There is mild left ventricular hypertrophy. Left ventricular diastolic parameters are indeterminate.  2. Right ventricular systolic function is normal. The right ventricular size is normal. Tricuspid regurgitation signal is inadequate for assessing PA pressure.  3. There is a trivial pericardial effusion posterior to the left  ventricle and anterior to the right ventricle.  4. The mitral valve is grossly normal. Trivial mitral valve  regurgitation.  5. The aortic valve is tricuspid. Aortic valve regurgitation is not visualized. Aortic valve sclerosis is present, with no evidence of aortic valve stenosis.  6. The inferior vena cava is normal in size with greater than 50% respiratory variability, suggesting right atrial pressure of 3 mmHg.    Neuro/Psych negative neurological ROS     GI/Hepatic negative GI ROS, Neg liver ROS,   Endo/Other  negative endocrine ROS  Renal/GU Renal disease     Musculoskeletal negative musculoskeletal ROS (+)   Abdominal   Peds  Hematology negative hematology ROS (+)   Anesthesia Other Findings   Reproductive/Obstetrics                             Anesthesia Physical Anesthesia Plan  ASA: 4 and emergent  Anesthesia Plan: General   Post-op Pain Management:    Induction: Intravenous  PONV Risk Score and Plan: 2 and Treatment may vary due to age or medical condition and Midazolam  Airway Management Planned: Oral ETT  Additional Equipment: Arterial line, CVP, TEE and Ultrasound Guidance Line Placement  Intra-op Plan:   Post-operative Plan: Post-operative intubation/ventilation  Informed Consent: I have reviewed the patients History and Physical, chart, labs and discussed the procedure including the risks, benefits and alternatives for the proposed anesthesia with the patient or authorized representative who has indicated his/her understanding and acceptance.     Dental advisory given  Plan Discussed with: CRNA  Anesthesia Plan Comments:        Anesthesia Quick Evaluation

## 2021-11-12 NOTE — Plan of Care (Signed)
Pt had critical troponin during overnight Provider made aware no new orders received. Provider made aware of potassium level as well 40 meq of K given po and 2 runs of of k given  Problem: Education: Goal: Knowledge of General Education information will improve Description: Including pain rating scale, medication(s)/side effects and non-pharmacologic comfort measures Outcome: Progressing   Problem: Health Behavior/Discharge Planning: Goal: Ability to manage health-related needs will improve Outcome: Progressing   Problem: Clinical Measurements: Goal: Ability to maintain clinical measurements within normal limits will improve Outcome: Progressing Goal: Will remain free from infection Outcome: Progressing Goal: Diagnostic test results will improve Outcome: Progressing Goal: Respiratory complications will improve Outcome: Progressing Goal: Cardiovascular complication will be avoided Outcome: Progressing   Problem: Activity: Goal: Risk for activity intolerance will decrease Outcome: Progressing   Problem: Nutrition: Goal: Adequate nutrition will be maintained Outcome: Progressing   Problem: Pain Managment: Goal: General experience of comfort will improve Outcome: Progressing   Problem: Safety: Goal: Ability to remain free from injury will improve Outcome: Progressing

## 2021-11-12 NOTE — Anesthesia Procedure Notes (Signed)
Central Venous Catheter Insertion Performed by: Lewie Loron, MD, anesthesiologist Start/End11/23/2022 9:45 PM, 11/12/2021 10:05 PM Patient location: Pre-op. Preanesthetic checklist: patient identified, IV checked, site marked, risks and benefits discussed, surgical consent, monitors and equipment checked, pre-op evaluation, timeout performed and anesthesia consent Position: supine Lidocaine 1% used for infiltration and patient sedated Hand hygiene performed  and maximum sterile barriers used  Catheter size: 8.5 Fr Sheath introducer Procedure performed using ultrasound guided technique. Ultrasound Notes:anatomy identified, needle tip was noted to be adjacent to the nerve/plexus identified, no ultrasound evidence of intravascular and/or intraneural injection and image(s) printed for medical record Attempts: 1 Following insertion, line sutured, dressing applied and Biopatch. Post procedure assessment: blood return through all ports, free fluid flow and no air  Patient tolerated the procedure well with no immediate complications. Additional procedure comments: Tripple lumen "SLIC" placed through introducer.

## 2021-11-12 NOTE — Consult Note (Signed)
[name]       [place] [address]             [phone]                    Dean Mendez [name] Date of Birth: [birth year]  Referring: No ref. provider found Primary Care: [name], MD Primary Cardiologist: None  Chief Complaint:    Chief Complaint  Patient presents with   Loss of Consciousness   Chest Pain    History of Present Illness:    Dean Mendez 70 y.o. male transferred from [facility] with an NSTEMI.  He is also been having syncopal episodes.  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  CTS was consulted.  He is currently having chest pain.    Past Medical History:  Diagnosis Date   Hypertension     History reviewed. No pertinent surgical history.  Family History  Problem Relation Age of Onset   Heart attack Brother      Social History   Tobacco Use  Smoking Status Every Day   Packs/day: 0.25   Types: Cigarettes  Smokeless Tobacco Never    Social History   Substance and Sexual Activity  Alcohol Use Not Currently     No Known Allergies  Current Facility-Administered Medications  Medication Dose Route Frequency Provider Last Rate Last Admin   0.9% sodium chloride infusion  1 mL/kg/hr Intravenous Continuous Bernerd Pho M, PA-C 89.8 mL/hr at 11/12/21 1103 1 mL/kg/hr at 11/12/21 1103   0.9% sodium chloride infusion  1 mL/kg/hr Intravenous Continuous [name], MD       [facility] Hold] aspirin EC tablet 81 mg  81 mg Oral Daily Adefeso, Oladapo, DO   81 mg at 11/12/21 0844   [MAR Hold] atorvastatin (LIPITOR) tablet 40 mg  40 mg Oral Daily Emokpae, Courage, MD   40 mg at 11/12/21 0844   fentaNYL (SUBLIMAZE) injection    PRN [name], MD   50 mcg at 11/12/21 1744   Heparin (Porcine) in NaCl 1000-0.9 UT/500ML-% SOLN    PRN [name], MD   500 mL at 11/12/21 1701   heparin ADULT infusion 100  units/mL (25000 units/2103mL)  1,100 Units/hr Intravenous Continuous Adefeso, Oladapo, DO   Stopped at 11/12/21 1552   heparin sodium (porcine) injection    PRN [name], MD   5,000 Units at 11/12/21 1650   iohexol (OMNIPAQUE) 350 MG/ML injection    PRN [name], MD   105 mL at 11/12/21 1800   [MAR Hold] isosorbide mononitrate (IMDUR) 24 hr tablet 30 mg  30 mg Oral Daily Emokpae, Courage, MD   30 mg at 11/12/21 0844   lidocaine (PF) (XYLOCAINE) 1 % injection    PRN [name], MD   2 mL at 11/12/21 1621   [MAR Hold] lisinopril (ZESTRIL) tablet 20 mg  20 mg Oral Daily [name], RPH   20 mg at 11/12/21 0845   [MAR Hold] metoprolol tartrate (LOPRESSOR) tablet 12.5 mg  12.5 mg Oral BID [name], MD   12.5 mg at 11/12/21 0845   midazolam (VERSED) injection    PRN [name], MD   2 mg at 11/12/21 1618   [MAR Hold] nitroGLYCERIN (NITROSTAT) SL tablet 0.4 mg  0.4 mg Sublingual Q5 min PRN [name],  DO       nitroGLYCERIN (NITROSTAT) SL tablet    PRN [name], MD   0.4 mg at 11/12/21 1744   nitroGLYCERIN 50 mg in dextrose 5 % 250 mL (0.2 mg/mL) infusion    Continuous PRN [name], MD 1.5 mL/hr at 11/12/21 1750 5 mcg/min at 11/12/21 1750   Radial Cocktail/Verapamil only    PRN [name], MD   10 mL at 11/12/21 1622   ticagrelor (BRILINTA) tablet    PRN [name], MD   180 mg at 11/12/21 1700   tirofiban (AGGRASTAT) bolus via infusion    PRN [name], MD   2,245 mcg at 11/12/21 1653   tirofiban (AGGRASTAT) infusion 50 mcg/mL 100 mL    Continuous PRN [name], MD   Stopped at 11/12/21 1743    Review of Systems  Respiratory:  Positive for shortness of breath.   Cardiovascular:  Positive for chest pain.   PHYSICAL EXAMINATION: BP (!) 146/103   Pulse (!) 58   Temp 97.7 F (36.5 C) (Oral)   Resp 20   Ht 6' (1.829 m)   Wt 89.8 kg   SpO2 99%   BMI 26.85 kg/m   Physical Exam Constitutional:       Appearance: He is ill-appearing.  Cardiovascular:     Rate and Rhythm: Regular rhythm. Bradycardia present.  Pulmonary:     Effort: Pulmonary effort is normal.  Neurological:     Mental Status: He is alert.     Diagnostic Studies & Laboratory data:     Recent Radiology Findings:   DG Chest 2 View  Result Date: 11/11/2021 CLINICAL DATA:  Chest pain, syncope EXAM: CHEST - 2 VIEW COMPARISON:  Previous studies including the CT chest done on 07/06/2019 FINDINGS: Cardiac size is within normal limits. Thoracic aorta is tortuous and ectatic. There are no signs of alveolar pulmonary edema or focal pulmonary consolidation. Centrilobular and panlobular emphysema is seen in the upper lung fields. Linear density in the medial right lower lung fields most likely suggests minimal scarring. There is no pleural effusion or pneumothorax. IMPRESSION: COPD. There are no new infiltrates or signs of pulmonary edema. Linear density in the medial right lower lung fields most likely suggests scarring. Electronically Signed   By: Elmer Picker M.D.   On: 11/11/2021 13:16   CT Angio Chest PE W and/or Wo Contrast  Result Date: 11/11/2021 CLINICAL DATA:  PE suspected, high prob Chest pain and shortness of breath after fall earlier today. EXAM: CT ANGIOGRAPHY CHEST WITH CONTRAST TECHNIQUE: Multidetector CT imaging of the chest was performed using the standard protocol during bolus administration of intravenous contrast. Multiplanar CT image reconstructions and MIPs were obtained to evaluate the vascular anatomy. CONTRAST:  39mL OMNIPAQUE IOHEXOL 350 MG/ML SOLN COMPARISON:  Radiograph earlier today.  Chest CT 07/06/2019 FINDINGS: Cardiovascular: There are no filling defects within the pulmonary arteries to suggest pulmonary embolus. Atherosclerosis of the thoracic aorta which is tortuous. No aneurysm or acute aortic findings. The heart is normal in size. There are coronary artery calcifications. No pericardial effusion.  Mediastinum/Nodes: No enlarged mediastinal or hilar lymph nodes. No esophageal wall thickening. No visualized thyroid nodule Lungs/Pleura: Advanced emphysema. Bandlike atelectasis or scarring in the right lower lobe. Subsegmental atelectasis in the left lower lobe. No acute airspace disease. No pleural effusion. No pneumothorax. There is retained mucus within the dependent trachea. No dominant pulmonary mass. Previous perifissural lingular nodule has resolved in the interim.  Upper Abdomen: No acute findings.  No upper abdominal free fluid. Musculoskeletal: No acute rib fracture. No acute osseous abnormalities are seen. Thoracic spondylosis with endplate spurring. No confluent chest wall contusion. Review of the MIP images confirms the above findings. IMPRESSION: 1. No pulmonary embolus or acute intrathoracic abnormality. 2. Moderately advanced emphysema. Minimal retained mucus in the trachea. 3. Aortic atherosclerosis.  Coronary artery calcifications. Aortic Atherosclerosis (ICD10-I70.0) and Emphysema (ICD10-J43.9). Electronically Signed   By: Narda Rutherford M.D.   On: 11/11/2021 20:14   ECHOCARDIOGRAM COMPLETE  Result Date: 11/12/2021    ECHOCARDIOGRAM REPORT   Patient Name:   Dean Mendez Date of Exam: 11/12/2021 Medical Rec #:  [id number]             Height:       72.0 in Accession #:    [id number]            Weight:       198.0 lb Date of Birth:  [birth year]             BSA:          2.121 m Patient Age:    70 years              BP:           168/97 mmHg Patient Gender: M                     HR:           67 bpm. [facility] Procedure: 2D Echo, Cardiac Doppler and Color Doppler Indications:    Chest Pain  History:        Patient has no prior history of Echocardiogram examinations.                 Signs/Symptoms:Chest Pain and Syncope; Risk Factors:Hypertension                 and Dyslipidemia.  Sonographer:    Mikki Harbor Referring Phys: [id number] [name] IMPRESSIONS  1.  Left ventricular ejection fraction, by estimation, is 60 to 65%. The left ventricle has normal function. The left ventricle has no regional wall motion abnormalities. There is moderate left ventricular hypertrophy. Left ventricular diastolic parameters are consistent with Grade I diastolic dysfunction (impaired relaxation).  2. Right ventricular systolic function is normal. The right ventricular size is normal. There is normal pulmonary artery systolic pressure.  3. The mitral valve is normal in structure. No evidence of mitral valve regurgitation. No evidence of mitral stenosis.  4. The aortic valve is tricuspid. Aortic valve regurgitation is not visualized. No aortic stenosis is present.  5. The inferior vena cava is normal in size with greater than 50% respiratory variability, suggesting right atrial pressure of 3 mmHg. FINDINGS  Left Ventricle: Left ventricular ejection fraction, by estimation, is 60 to 65%. The left ventricle has normal function. The left ventricle has no regional wall motion abnormalities. The left ventricular internal cavity size was normal in size. There is  moderate left ventricular hypertrophy. Left ventricular diastolic parameters are consistent with Grade I diastolic dysfunction (impaired relaxation). Normal left ventricular filling pressure. Right Ventricle: The right ventricular size is normal. No increase in right ventricular wall thickness. Right ventricular systolic function is normal. There is normal pulmonary artery systolic pressure. The tricuspid regurgitant velocity is 2.17 m/s, and  with an assumed right atrial pressure of 3 mmHg, the estimated right ventricular systolic pressure is 21.8 mmHg.  Left Atrium: Left atrial size was normal in size. Right Atrium: Right atrial size was normal in size. Pericardium: There is no evidence of pericardial effusion. Mitral Valve: The mitral valve is normal in structure. No evidence of mitral valve regurgitation. No evidence of mitral valve  stenosis. MV peak gradient, 2.8 mmHg. The mean mitral valve gradient is 1.0 mmHg. Tricuspid Valve: The tricuspid valve is normal in structure. Tricuspid valve regurgitation is mild . No evidence of tricuspid stenosis. Aortic Valve: The aortic valve is tricuspid. Aortic valve regurgitation is not visualized. No aortic stenosis is present. Aortic valve mean gradient measures 5.0 mmHg. Aortic valve peak gradient measures 8.8 mmHg. Aortic valve area, by VTI measures 2.20 cm. Pulmonic Valve: The pulmonic valve was not well visualized. Pulmonic valve regurgitation is not visualized. No evidence of pulmonic stenosis. Aorta: The aortic root is normal in size and structure. Venous: The inferior vena cava is normal in size with greater than 50% respiratory variability, suggesting right atrial pressure of 3 mmHg. IAS/Shunts: No atrial level shunt detected by color flow Doppler.  LEFT VENTRICLE PLAX 2D LVIDd:         3.90 cm     Diastology LVIDs:         2.20 cm     LV e' medial:    5.66 cm/s LV PW:         1.40 cm     LV E/e' medial:  13.4 LV IVS:        1.50 cm     LV e' lateral:   7.72 cm/s LVOT diam:     2.00 cm     LV E/e' lateral: 9.8 LV SV:         71 LV SV Index:   33 LVOT Area:     3.14 cm  LV Volumes (MOD) LV vol d, MOD A2C: 59.9 ml LV vol d, MOD A4C: 59.2 ml LV vol s, MOD A2C: 28.4 ml LV vol s, MOD A4C: 24.1 ml LV SV MOD A2C:     31.5 ml LV SV MOD A4C:     59.2 ml LV SV MOD BP:      33.2 ml RIGHT VENTRICLE RV Basal diam:  3.70 cm RV Mid diam:    3.40 cm RV S prime:     11.30 cm/s TAPSE (M-mode): 2.5 cm LEFT ATRIUM             Index        RIGHT ATRIUM           Index LA diam:        3.30 cm 1.56 cm/m   RA Area:     16.50 cm LA Vol (A2C):   35.8 ml 16.88 ml/m  RA Volume:   48.00 ml  22.63 ml/m LA Vol (A4C):   37.9 ml 17.86 ml/m LA Biplane Vol: 36.6 ml 17.25 ml/m  AORTIC VALVE                     PULMONIC VALVE AV Area (Vmax):    1.97 cm      PV Vmax:       0.71 m/s AV Area (Vmean):   2.07 cm      PV Peak  grad:  2.0 mmHg AV Area (VTI):     2.20 cm AV Vmax:           148.00 cm/s AV Vmean:          106.000 cm/s AV  VTI:            0.322 m AV Peak Grad:      8.8 mmHg AV Mean Grad:      5.0 mmHg LVOT Vmax:         92.70 cm/s LVOT Vmean:        69.700 cm/s LVOT VTI:          0.225 m LVOT/AV VTI ratio: 0.70  AORTA Ao Root diam: 3.20 cm MITRAL VALVE               TRICUSPID VALVE MV Area (PHT): 4.29 cm    TR Peak grad:   18.8 mmHg MV Area VTI:   3.55 cm    TR Vmax:        217.00 cm/s MV Peak grad:  2.8 mmHg MV Mean grad:  1.0 mmHg    SHUNTS MV Vmax:       0.84 m/s    Systemic VTI:  0.22 m MV Vmean:      51.8 cm/s   Systemic Diam: 2.00 cm MV Decel Time: 177 msec MV E velocity: 75.80 cm/s MV A velocity: 87.40 cm/s MV E/A ratio:  0.87 Carlyle Dolly MD Electronically signed by Carlyle Dolly MD Signature Date/Time: 11/12/2021/10:25:53 AM    Final        I have independently reviewed the above radiology studies  and reviewed the findings with the patient.   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  He has diffuse disease in all territories.  It appears as though he is has clot in his ramus.  He is having ongoing chest pain.  Despite receiving Aggrastat and Brilinta the patient will be taken emergently to the operating room for bypass.      Lajuana Matte 11/12/2021 6:42 PM

## 2021-11-12 NOTE — Anesthesia Procedure Notes (Signed)
Arterial Line Insertion Start/End11/23/2022 9:00 AM, 11/12/2021 9:10 AM Performed by: Adair Laundry, CRNA, CRNA  Patient location: OR. Preanesthetic checklist: patient identified, IV checked, site marked, risks and benefits discussed, surgical consent, monitors and equipment checked, pre-op evaluation, timeout performed and anesthesia consent Patient sedated Left, radial was placed Catheter size: 20 G Hand hygiene performed  and maximum sterile barriers used   Attempts: 1 Procedure performed without using ultrasound guided technique. Following insertion, Biopatch and dressing applied. Patient tolerated the procedure well with no immediate complications.

## 2021-11-13 ENCOUNTER — Encounter (HOSPITAL_COMMUNITY): Payer: Self-pay | Admitting: Thoracic Surgery (Cardiothoracic Vascular Surgery)

## 2021-11-13 ENCOUNTER — Inpatient Hospital Stay (HOSPITAL_COMMUNITY): Payer: Medicare HMO

## 2021-11-13 DIAGNOSIS — J95811 Postprocedural pneumothorax: Secondary | ICD-10-CM

## 2021-11-13 DIAGNOSIS — Z951 Presence of aortocoronary bypass graft: Secondary | ICD-10-CM

## 2021-11-13 DIAGNOSIS — I214 Non-ST elevation (NSTEMI) myocardial infarction: Principal | ICD-10-CM

## 2021-11-13 LAB — POCT I-STAT 7, (LYTES, BLD GAS, ICA,H+H)
Acid-base deficit: 6 mmol/L — ABNORMAL HIGH (ref 0.0–2.0)
Acid-base deficit: 5 mmol/L — ABNORMAL HIGH (ref 0.0–2.0)
Acid-base deficit: 3 mmol/L — ABNORMAL HIGH (ref 0.0–2.0)
Acid-base deficit: 3 mmol/L — ABNORMAL HIGH (ref 0.0–2.0)
Acid-base deficit: 3 mmol/L — ABNORMAL HIGH (ref 0.0–2.0)
Bicarbonate: 21.3 mmol/L (ref 20.0–28.0)
Bicarbonate: 22 mmol/L (ref 20.0–28.0)
Bicarbonate: 24.6 mmol/L (ref 20.0–28.0)
Bicarbonate: 23.5 mmol/L (ref 20.0–28.0)
Bicarbonate: 20.4 mmol/L (ref 20.0–28.0)
Calcium, Ion: 1.19 mmol/L (ref 1.15–1.40)
Calcium, Ion: 1.18 mmol/L (ref 1.15–1.40)
Calcium, Ion: 1.21 mmol/L (ref 1.15–1.40)
Calcium, Ion: 1.16 mmol/L (ref 1.15–1.40)
Calcium, Ion: 1.12 mmol/L — ABNORMAL LOW (ref 1.15–1.40)
HCT: 33 % — ABNORMAL LOW (ref 39.0–52.0)
HCT: 28 % — ABNORMAL LOW (ref 39.0–52.0)
HCT: 24 % — ABNORMAL LOW (ref 39.0–52.0)
HCT: 22 % — ABNORMAL LOW (ref 39.0–52.0)
HCT: 25 % — ABNORMAL LOW (ref 39.0–52.0)
Hemoglobin: 11.2 g/dL — ABNORMAL LOW (ref 13.0–17.0)
Hemoglobin: 9.5 g/dL — ABNORMAL LOW (ref 13.0–17.0)
Hemoglobin: 8.2 g/dL — ABNORMAL LOW (ref 13.0–17.0)
Hemoglobin: 7.5 g/dL — ABNORMAL LOW (ref 13.0–17.0)
Hemoglobin: 8.5 g/dL — ABNORMAL LOW (ref 13.0–17.0)
O2 Saturation: 87 %
O2 Saturation: 100 %
O2 Saturation: 98 %
O2 Saturation: 100 %
O2 Saturation: 98 %
Patient temperature: 35.4
Patient temperature: 35.2
Patient temperature: 36.3
Patient temperature: 37.4
Patient temperature: 37.9
Potassium: 4.2 mmol/L (ref 3.5–5.1)
Potassium: 3.9 mmol/L (ref 3.5–5.1)
Potassium: 4.6 mmol/L (ref 3.5–5.1)
Potassium: 4.6 mmol/L (ref 3.5–5.1)
Potassium: 4.1 mmol/L (ref 3.5–5.1)
Sodium: 142 mmol/L (ref 135–145)
Sodium: 143 mmol/L (ref 135–145)
Sodium: 144 mmol/L (ref 135–145)
Sodium: 144 mmol/L (ref 135–145)
Sodium: 144 mmol/L (ref 135–145)
TCO2: 23 mmol/L (ref 22–32)
TCO2: 24 mmol/L (ref 22–32)
TCO2: 26 mmol/L (ref 22–32)
TCO2: 25 mmol/L (ref 22–32)
TCO2: 21 mmol/L — ABNORMAL LOW (ref 22–32)
pCO2 arterial: 45.6 mmHg (ref 32.0–48.0)
pCO2 arterial: 47.8 mmHg (ref 32.0–48.0)
pCO2 arterial: 56.5 mmHg — ABNORMAL HIGH (ref 32.0–48.0)
pCO2 arterial: 49.6 mmHg — ABNORMAL HIGH (ref 32.0–48.0)
pCO2 arterial: 32.4 mmHg (ref 32.0–48.0)
pH, Arterial: 7.268 — ABNORMAL LOW (ref 7.350–7.450)
pH, Arterial: 7.261 — ABNORMAL LOW (ref 7.350–7.450)
pH, Arterial: 7.243 — ABNORMAL LOW (ref 7.350–7.450)
pH, Arterial: 7.286 — ABNORMAL LOW (ref 7.350–7.450)
pH, Arterial: 7.412 (ref 7.350–7.450)
pO2, Arterial: 56 mmHg — ABNORMAL LOW (ref 83.0–108.0)
pO2, Arterial: 203 mmHg — ABNORMAL HIGH (ref 83.0–108.0)
pO2, Arterial: 121 mmHg — ABNORMAL HIGH (ref 83.0–108.0)
pO2, Arterial: 189 mmHg — ABNORMAL HIGH (ref 83.0–108.0)
pO2, Arterial: 109 mmHg — ABNORMAL HIGH (ref 83.0–108.0)

## 2021-11-13 LAB — CBC
HCT: 30.2 % — ABNORMAL LOW (ref 39.0–52.0)
HCT: 24.6 % — ABNORMAL LOW (ref 39.0–52.0)
HCT: 28 % — ABNORMAL LOW (ref 39.0–52.0)
Hemoglobin: 9.6 g/dL — ABNORMAL LOW (ref 13.0–17.0)
Hemoglobin: 7.9 g/dL — ABNORMAL LOW (ref 13.0–17.0)
Hemoglobin: 9.6 g/dL — ABNORMAL LOW (ref 13.0–17.0)
MCH: 27.7 pg (ref 26.0–34.0)
MCH: 28.1 pg (ref 26.0–34.0)
MCH: 29.4 pg (ref 26.0–34.0)
MCHC: 31.8 g/dL (ref 30.0–36.0)
MCHC: 32.1 g/dL (ref 30.0–36.0)
MCHC: 34.3 g/dL (ref 30.0–36.0)
MCV: 87 fL (ref 80.0–100.0)
MCV: 87.5 fL (ref 80.0–100.0)
MCV: 85.6 fL (ref 80.0–100.0)
Platelets: 165 10*3/uL (ref 150–400)
Platelets: 169 10*3/uL (ref 150–400)
Platelets: 154 10*3/uL (ref 150–400)
RBC: 3.47 MIL/uL — ABNORMAL LOW (ref 4.22–5.81)
RBC: 2.81 MIL/uL — ABNORMAL LOW (ref 4.22–5.81)
RBC: 3.27 MIL/uL — ABNORMAL LOW (ref 4.22–5.81)
RDW: 14.3 % (ref 11.5–15.5)
RDW: 14.4 % (ref 11.5–15.5)
RDW: 13.8 % (ref 11.5–15.5)
WBC: 14.1 10*3/uL — ABNORMAL HIGH (ref 4.0–10.5)
WBC: 12.4 10*3/uL — ABNORMAL HIGH (ref 4.0–10.5)
WBC: 10.3 10*3/uL (ref 4.0–10.5)
nRBC: 0 % (ref 0.0–0.2)
nRBC: 0 % (ref 0.0–0.2)
nRBC: 0 % (ref 0.0–0.2)

## 2021-11-13 LAB — PREPARE PLATELET PHERESIS
Unit division: 0
Unit division: 0

## 2021-11-13 LAB — BASIC METABOLIC PANEL
Anion gap: 6 (ref 5–15)
Anion gap: 7 (ref 5–15)
BUN: 11 mg/dL (ref 8–23)
BUN: 16 mg/dL (ref 8–23)
CO2: 23 mmol/L (ref 22–32)
CO2: 22 mmol/L (ref 22–32)
Calcium: 7.5 mg/dL — ABNORMAL LOW (ref 8.9–10.3)
Calcium: 7.5 mg/dL — ABNORMAL LOW (ref 8.9–10.3)
Chloride: 110 mmol/L (ref 98–111)
Chloride: 110 mmol/L (ref 98–111)
Creatinine, Ser: 1.2 mg/dL (ref 0.61–1.24)
Creatinine, Ser: 1.52 mg/dL — ABNORMAL HIGH (ref 0.61–1.24)
GFR, Estimated: >60 mL/min (ref >60)
GFR, Estimated: 50 mL/min — ABNORMAL LOW (ref >60)
Glucose, Bld: 108 mg/dL — ABNORMAL HIGH (ref 70–99)
Glucose, Bld: 168 mg/dL — ABNORMAL HIGH (ref 70–99)
Potassium: 4.6 mmol/L (ref 3.5–5.1)
Potassium: 4.4 mmol/L (ref 3.5–5.1)
Sodium: 139 mmol/L (ref 135–145)
Sodium: 139 mmol/L (ref 135–145)

## 2021-11-13 LAB — GLUCOSE, CAPILLARY
Glucose-Capillary: 100 mg/dL — ABNORMAL HIGH (ref 70–99)
Glucose-Capillary: 100 mg/dL — ABNORMAL HIGH (ref 70–99)
Glucose-Capillary: 94 mg/dL (ref 70–99)
Glucose-Capillary: 96 mg/dL (ref 70–99)
Glucose-Capillary: 109 mg/dL — ABNORMAL HIGH (ref 70–99)
Glucose-Capillary: 133 mg/dL — ABNORMAL HIGH (ref 70–99)
Glucose-Capillary: 131 mg/dL — ABNORMAL HIGH (ref 70–99)
Glucose-Capillary: 126 mg/dL — ABNORMAL HIGH (ref 70–99)
Glucose-Capillary: 172 mg/dL — ABNORMAL HIGH (ref 70–99)

## 2021-11-13 LAB — APTT: aPTT: 37 seconds — ABNORMAL HIGH (ref 24–36)

## 2021-11-13 LAB — PROTIME-INR
INR: 1.6 — ABNORMAL HIGH (ref 0.8–1.2)
Prothrombin Time: 19.4 seconds — ABNORMAL HIGH (ref 11.4–15.2)

## 2021-11-13 LAB — BPAM PLATELET PHERESIS
Blood Product Expiration Date: 202211242359
Blood Product Expiration Date: 202211242359
ISSUE DATE / TIME: 202211232324
ISSUE DATE / TIME: 202211232324
Unit Type and Rh: 600
Unit Type and Rh: 6200

## 2021-11-13 LAB — MAGNESIUM
Magnesium: 2.8 mg/dL — ABNORMAL HIGH (ref 1.7–2.4)
Magnesium: 3.1 mg/dL — ABNORMAL HIGH (ref 1.7–2.4)

## 2021-11-13 LAB — PREPARE RBC (CROSSMATCH)

## 2021-11-13 LAB — FIBRINOGEN: Fibrinogen: 219 mg/dL (ref 210–475)

## 2021-11-13 MED ORDER — CHLORHEXIDINE GLUCONATE 0.12% ORAL RINSE (MEDLINE KIT)
15.0000 mL | Freq: Two times a day (BID) | OROMUCOSAL | Status: DC
  Administered 2021-11-13 – 2021-11-14 (×3): 15 mL via OROMUCOSAL

## 2021-11-13 MED ORDER — CHLORHEXIDINE GLUCONATE CLOTH 2 % EX PADS: 6.0000 | MEDICATED_PAD | Freq: Once | CUTANEOUS | Status: DC

## 2021-11-13 MED ORDER — ONDANSETRON HCL 4 MG/2ML IJ SOLN: 4.0000 mg | Freq: Four times a day (QID) | INTRAMUSCULAR | Status: DC | PRN

## 2021-11-13 MED ORDER — CEFAZOLIN SODIUM-DEXTROSE 2-4 GM/100ML-% IV SOLN
2.0000 g | Freq: Three times a day (TID) | INTRAVENOUS | Status: AC
  Administered 2021-11-13 – 2021-11-14 (×6): 2 g via INTRAVENOUS
  Filled 2021-11-13 (×6): qty 100

## 2021-11-13 MED ORDER — NOREPINEPHRINE 4 MG/250ML-% IV SOLN
0.0000 ug/min | INTRAVENOUS | Status: DC
  Administered 2021-11-13: 3 ug/min via INTRAVENOUS
  Filled 2021-11-13: qty 250

## 2021-11-13 MED ORDER — ONDANSETRON HCL 4 MG/2ML IJ SOLN
4.0000 mg | Freq: Four times a day (QID) | INTRAMUSCULAR | Status: DC | PRN
  Administered 2021-11-14: 4 mg via INTRAVENOUS
  Filled 2021-11-13: qty 2

## 2021-11-13 MED ORDER — SODIUM CHLORIDE 0.9% FLUSH: 3.0000 mL | INTRAVENOUS | Status: DC | PRN

## 2021-11-13 MED ORDER — METOPROLOL TARTRATE 5 MG/5ML IV SOLN
2.5000 mg | INTRAVENOUS | Status: DC | PRN
  Administered 2021-11-14: 5 mg via INTRAVENOUS
  Administered 2021-11-14 – 2021-11-15 (×2): 2.5 mg via INTRAVENOUS
  Administered 2021-11-15: 5 mg via INTRAVENOUS
  Filled 2021-11-13 (×4): qty 5

## 2021-11-13 MED ORDER — SODIUM CHLORIDE 0.9 % IV SOLN: INTRAVENOUS | Status: DC

## 2021-11-13 MED ORDER — OXYCODONE HCL 5 MG PO TABS
5.0000 mg | ORAL_TABLET | ORAL | Status: DC | PRN
  Administered 2021-11-14 – 2021-11-15 (×6): 10 mg via ORAL
  Administered 2021-11-16: 21:00:00 5 mg via ORAL
  Administered 2021-11-16: 06:00:00 10 mg via ORAL
  Administered 2021-11-18 – 2021-11-21 (×3): 5 mg via ORAL
  Filled 2021-11-13: qty 1
  Filled 2021-11-13: qty 2
  Filled 2021-11-13: qty 1
  Filled 2021-11-13 (×4): qty 2
  Filled 2021-11-13 (×2): qty 1
  Filled 2021-11-13 (×2): qty 2

## 2021-11-13 MED ORDER — POTASSIUM CHLORIDE 10 MEQ/50ML IV SOLN: 10.0000 meq | INTRAVENOUS | Status: AC

## 2021-11-13 MED ORDER — ALBUMIN HUMAN 5 % IV SOLN
25.0000 g | Freq: Once | INTRAVENOUS | Status: AC
  Administered 2021-11-13: 25 g via INTRAVENOUS

## 2021-11-13 MED ORDER — LACTATED RINGERS IV SOLN: 500.0000 mL | Freq: Once | INTRAVENOUS | Status: DC | PRN

## 2021-11-13 MED ORDER — LABETALOL HCL 5 MG/ML IV SOLN: 10.0000 mg | INTRAVENOUS | Status: AC | PRN

## 2021-11-13 MED ORDER — SODIUM CHLORIDE 0.9% IV SOLUTION: Freq: Once | INTRAVENOUS | Status: DC

## 2021-11-13 MED ORDER — ACETAMINOPHEN 500 MG PO TABS
1000.0000 mg | ORAL_TABLET | Freq: Four times a day (QID) | ORAL | Status: AC
  Administered 2021-11-14 – 2021-11-17 (×14): 1000 mg via ORAL
  Filled 2021-11-13 (×15): qty 2

## 2021-11-13 MED ORDER — INSULIN ASPART 100 UNIT/ML IJ SOLN
0.0000 [IU] | INTRAMUSCULAR | Status: DC
  Administered 2021-11-13 (×2): 2 [IU] via SUBCUTANEOUS
  Administered 2021-11-13: 3 [IU] via SUBCUTANEOUS
  Administered 2021-11-13 – 2021-11-14 (×3): 2 [IU] via SUBCUTANEOUS
  Administered 2021-11-15: 3 [IU] via SUBCUTANEOUS

## 2021-11-13 MED ORDER — SODIUM CHLORIDE 0.9% FLUSH
3.0000 mL | Freq: Two times a day (BID) | INTRAVENOUS | Status: DC
  Administered 2021-11-13 – 2021-11-16 (×8): 3 mL via INTRAVENOUS

## 2021-11-13 MED ORDER — ALBUMIN HUMAN 5 % IV SOLN
250.0000 mL | INTRAVENOUS | Status: AC | PRN
  Administered 2021-11-13 (×4): 12.5 g via INTRAVENOUS
  Filled 2021-11-13 (×2): qty 250

## 2021-11-13 MED ORDER — ACETAMINOPHEN 160 MG/5ML PO SOLN: 650.0000 mg | Freq: Once | ORAL | Status: AC

## 2021-11-13 MED ORDER — VANCOMYCIN HCL IN DEXTROSE 1-5 GM/200ML-% IV SOLN
1000.0000 mg | Freq: Once | INTRAVENOUS | Status: AC
  Administered 2021-11-13: 1000 mg via INTRAVENOUS
  Filled 2021-11-13: qty 200

## 2021-11-13 MED ORDER — SODIUM CHLORIDE 0.9 % IV SOLN: 250.0000 mL | INTRAVENOUS | Status: DC | PRN

## 2021-11-13 MED ORDER — ACETAMINOPHEN 650 MG RE SUPP
650.0000 mg | Freq: Once | RECTAL | Status: AC
  Administered 2021-11-13: 650 mg via RECTAL
  Filled 2021-11-13: qty 1

## 2021-11-13 MED ORDER — DEXMEDETOMIDINE HCL IN NACL 400 MCG/100ML IV SOLN
0.4000 ug/kg/h | INTRAVENOUS | Status: DC
  Administered 2021-11-13 (×2): 1.2 ug/kg/h via INTRAVENOUS
  Administered 2021-11-13: 0.7 ug/kg/h via INTRAVENOUS
  Administered 2021-11-13 – 2021-11-14 (×2): 1.2 ug/kg/h via INTRAVENOUS
  Filled 2021-11-13 (×2): qty 100
  Filled 2021-11-13: qty 200
  Filled 2021-11-13: qty 100
  Filled 2021-11-13: qty 200
  Filled 2021-11-13: qty 100

## 2021-11-13 MED ORDER — MIDAZOLAM HCL 2 MG/2ML IJ SOLN
2.0000 mg | INTRAMUSCULAR | Status: DC | PRN
  Administered 2021-11-13 (×4): 2 mg via INTRAVENOUS
  Filled 2021-11-13 (×5): qty 2

## 2021-11-13 MED ORDER — SODIUM BICARBONATE 8.4 % IV SOLN
50.0000 meq | Freq: Once | INTRAVENOUS | Status: AC
  Administered 2021-11-13: 50 meq via INTRAVENOUS
  Filled 2021-11-13: qty 50

## 2021-11-13 MED ORDER — DEXTROSE 50 % IV SOLN: 0.0000 mL | INTRAVENOUS | Status: DC | PRN

## 2021-11-13 MED ORDER — ACETAMINOPHEN 325 MG PO TABS: 650.0000 mg | ORAL_TABLET | ORAL | Status: DC | PRN

## 2021-11-13 MED ORDER — ASPIRIN 81 MG PO CHEW: 324.0000 mg | CHEWABLE_TABLET | Freq: Every day | ORAL | Status: DC

## 2021-11-13 MED ORDER — INSULIN REGULAR(HUMAN) IN NACL 100-0.9 UT/100ML-% IV SOLN: INTRAVENOUS | Status: DC

## 2021-11-13 MED ORDER — METOPROLOL TARTRATE 12.5 MG HALF TABLET: 12.5000 mg | ORAL_TABLET | Freq: Two times a day (BID) | ORAL | Status: DC

## 2021-11-13 MED ORDER — TRAMADOL HCL 50 MG PO TABS
50.0000 mg | ORAL_TABLET | ORAL | Status: DC | PRN
  Administered 2021-11-15: 100 mg via ORAL
  Filled 2021-11-13: qty 2

## 2021-11-13 MED ORDER — ACETAMINOPHEN 160 MG/5ML PO SOLN
1000.0000 mg | Freq: Four times a day (QID) | ORAL | Status: AC
  Administered 2021-11-13 – 2021-11-14 (×5): 1000 mg
  Filled 2021-11-13 (×5): qty 40.6

## 2021-11-13 MED ORDER — ORAL CARE MOUTH RINSE
15.0000 mL | OROMUCOSAL | Status: DC
  Administered 2021-11-13 – 2021-11-14 (×12): 15 mL via OROMUCOSAL

## 2021-11-13 MED ORDER — SODIUM BICARBONATE 8.4 % IV SOLN
50.0000 meq | Freq: Once | INTRAVENOUS | Status: AC
  Administered 2021-11-13: 50 meq via INTRAVENOUS

## 2021-11-13 MED ORDER — ASPIRIN EC 325 MG PO TBEC: 325.0000 mg | DELAYED_RELEASE_TABLET | Freq: Every day | ORAL | Status: DC

## 2021-11-13 MED ORDER — PANTOPRAZOLE SODIUM 40 MG PO TBEC
40.0000 mg | DELAYED_RELEASE_TABLET | Freq: Every day | ORAL | Status: DC
  Administered 2021-11-15 – 2021-11-22 (×8): 40 mg via ORAL
  Filled 2021-11-13 (×8): qty 1

## 2021-11-13 MED ORDER — CHLORHEXIDINE GLUCONATE CLOTH 2 % EX PADS
6.0000 | MEDICATED_PAD | Freq: Every day | CUTANEOUS | Status: DC
  Administered 2021-11-13 – 2021-11-18 (×6): 6 via TOPICAL

## 2021-11-13 MED ORDER — NITROGLYCERIN IN D5W 200-5 MCG/ML-% IV SOLN: 0.0000 ug/min | INTRAVENOUS | Status: DC

## 2021-11-13 MED ORDER — LACTATED RINGERS IV SOLN: INTRAVENOUS | Status: DC

## 2021-11-13 MED ORDER — NICARDIPINE HCL IN NACL 20-0.86 MG/200ML-% IV SOLN: 5.0000 mg/h | INTRAVENOUS | Status: DC

## 2021-11-13 MED ORDER — METOPROLOL TARTRATE 12.5 MG HALF TABLET: 12.5000 mg | ORAL_TABLET | Freq: Once | ORAL | Status: DC

## 2021-11-13 MED ORDER — MAGNESIUM SULFATE 4 GM/100ML IV SOLN
4.0000 g | Freq: Once | INTRAVENOUS | Status: AC
  Administered 2021-11-13: 4 g via INTRAVENOUS
  Filled 2021-11-13: qty 100

## 2021-11-13 MED ORDER — HYDRALAZINE HCL 20 MG/ML IJ SOLN: 10.0000 mg | INTRAMUSCULAR | Status: AC | PRN

## 2021-11-13 MED ORDER — SODIUM CHLORIDE 0.9% FLUSH
3.0000 mL | Freq: Two times a day (BID) | INTRAVENOUS | Status: DC
  Administered 2021-11-13 – 2021-11-16 (×6): 3 mL via INTRAVENOUS

## 2021-11-13 MED ORDER — CHLORHEXIDINE GLUCONATE 0.12 % MT SOLN: 15.0000 mL | Freq: Once | OROMUCOSAL | Status: DC

## 2021-11-13 MED ORDER — FAMOTIDINE IN NACL 20-0.9 MG/50ML-% IV SOLN
20.0000 mg | Freq: Two times a day (BID) | INTRAVENOUS | Status: AC
  Administered 2021-11-13 (×2): 20 mg via INTRAVENOUS
  Filled 2021-11-13 (×2): qty 50

## 2021-11-13 MED ORDER — SODIUM CHLORIDE 0.9% FLUSH
3.0000 mL | Freq: Two times a day (BID) | INTRAVENOUS | Status: DC
  Administered 2021-11-13 – 2021-11-16 (×5): 3 mL via INTRAVENOUS

## 2021-11-13 MED ORDER — METOPROLOL TARTRATE 25 MG/10 ML ORAL SUSPENSION: 12.5000 mg | Freq: Two times a day (BID) | ORAL | Status: DC

## 2021-11-13 MED ORDER — BISACODYL 5 MG PO TBEC
10.0000 mg | DELAYED_RELEASE_TABLET | Freq: Every day | ORAL | Status: DC
  Administered 2021-11-15 – 2021-11-19 (×3): 10 mg via ORAL
  Filled 2021-11-13 (×6): qty 2

## 2021-11-13 MED ORDER — MORPHINE SULFATE (PF) 2 MG/ML IV SOLN
1.0000 mg | INTRAVENOUS | Status: DC | PRN
  Administered 2021-11-13 (×3): 4 mg via INTRAVENOUS
  Administered 2021-11-13: 2 mg via INTRAVENOUS
  Administered 2021-11-14: 4 mg via INTRAVENOUS
  Administered 2021-11-14 (×5): 2 mg via INTRAVENOUS
  Administered 2021-11-14: 4 mg via INTRAVENOUS
  Administered 2021-11-14: 2 mg via INTRAVENOUS
  Administered 2021-11-14: 4 mg via INTRAVENOUS
  Administered 2021-11-15: 2 mg via INTRAVENOUS
  Administered 2021-11-15 (×3): 4 mg via INTRAVENOUS
  Administered 2021-11-15: 2 mg via INTRAVENOUS
  Filled 2021-11-13: qty 1
  Filled 2021-11-13 (×3): qty 2
  Filled 2021-11-13 (×3): qty 1
  Filled 2021-11-13 (×2): qty 2
  Filled 2021-11-13 (×2): qty 1
  Filled 2021-11-13: qty 2
  Filled 2021-11-13: qty 1
  Filled 2021-11-13: qty 2
  Filled 2021-11-13: qty 1
  Filled 2021-11-13 (×3): qty 2

## 2021-11-13 MED ORDER — LIDOCAINE HCL (PF) 1 % IJ SOLN
INTRAMUSCULAR | Status: AC
  Administered 2021-11-13: 5 mL
  Filled 2021-11-13: qty 30

## 2021-11-13 MED ORDER — SODIUM CHLORIDE 0.9 % IV SOLN
250.0000 mL | INTRAVENOUS | Status: DC
  Administered 2021-11-13 (×3): 250 mL via INTRAVENOUS

## 2021-11-13 MED ORDER — MILRINONE LACTATE IN DEXTROSE 20-5 MG/100ML-% IV SOLN: 0.3000 ug/kg/min | INTRAVENOUS | Status: DC

## 2021-11-13 MED ORDER — TEMAZEPAM 15 MG PO CAPS: 15.0000 mg | ORAL_CAPSULE | Freq: Once | ORAL | Status: DC | PRN

## 2021-11-13 MED ORDER — CHLORHEXIDINE GLUCONATE 0.12 % MT SOLN
15.0000 mL | OROMUCOSAL | Status: AC
  Administered 2021-11-13: 15 mL via OROMUCOSAL

## 2021-11-13 MED ORDER — BISACODYL 10 MG RE SUPP
10.0000 mg | Freq: Every day | RECTAL | Status: DC
  Administered 2021-11-13: 10:00:00 10 mg via RECTAL

## 2021-11-13 MED ORDER — BISACODYL 5 MG PO TBEC: 5.0000 mg | DELAYED_RELEASE_TABLET | Freq: Once | ORAL | Status: DC

## 2021-11-13 MED ORDER — DOCUSATE SODIUM 100 MG PO CAPS
200.0000 mg | ORAL_CAPSULE | Freq: Every day | ORAL | Status: DC
  Administered 2021-11-15 – 2021-11-16 (×2): 200 mg via ORAL
  Filled 2021-11-13 (×2): qty 2

## 2021-11-13 MED ORDER — MIDAZOLAM HCL 2 MG/2ML IJ SOLN
INTRAMUSCULAR | Status: AC
  Filled 2021-11-13: qty 2

## 2021-11-13 MED ORDER — PHENYLEPHRINE HCL-NACL 20-0.9 MG/250ML-% IV SOLN
0.0000 ug/min | INTRAVENOUS | Status: DC
  Administered 2021-11-13: 60 ug/min via INTRAVENOUS
  Administered 2021-11-13: 35 ug/min via INTRAVENOUS
  Filled 2021-11-13 (×4): qty 250

## 2021-11-13 MED ORDER — SODIUM CHLORIDE 0.45 % IV SOLN: INTRAVENOUS | Status: DC | PRN

## 2021-11-13 MED ORDER — SODIUM CHLORIDE 0.9 % IV SOLN
250.0000 mL | INTRAVENOUS | Status: DC | PRN
  Administered 2021-11-13: 250 mL via INTRAVENOUS

## 2021-11-13 MED FILL — Fentanyl Citrate Preservative Free (PF) Inj 100 MCG/2ML: INTRAMUSCULAR | Qty: 2 | Status: AC

## 2021-11-13 MED FILL — Nitroglycerin IV Soln 100 MCG/ML in D5W: INTRA_ARTERIAL | Qty: 10 | Status: AC

## 2021-11-13 NOTE — Progress Notes (Signed)
Patient ID: Dean Mendez, male   DOB: 09-May-1951, 70 y.o.   MRN: [id number] TCTS Evening Rounds:  Hemodynamically stable in sinus rhythm after transfusion today.  Neo down to 14 mcg.  Remained on vent today watching chest tube output which has decreased to 30-40/hr.  UO 40/hr.  BMET    Component Value Date/Time   NA 139 11/13/2021 1610   K 4.4 11/13/2021 1610   CL 110 11/13/2021 1610   CO2 22 11/13/2021 1610   GLUCOSE 168 (H) 11/13/2021 1610   BUN 16 11/13/2021 1610   CREATININE 1.52 (H) 11/13/2021 1610   CALCIUM 7.5 (L) 11/13/2021 1610   GFRNONAA 50 (L) 11/13/2021 1610   CBC    Component Value Date/Time   WBC 10.3 11/13/2021 1610   RBC 3.27 (L) 11/13/2021 1610   HGB 9.6 (L) 11/13/2021 1610   HCT 28.0 (L) 11/13/2021 1610   PLT 154 11/13/2021 1610   MCV 85.6 11/13/2021 1610   MCH 29.4 11/13/2021 1610   MCHC 34.3 11/13/2021 1610   RDW 13.8 11/13/2021 1610   Hgb improved after transfusion   Will check labs and CXR in am and if stable plan to wean to extubate in am.

## 2021-11-13 NOTE — Op Note (Signed)
[address]       [name] [address]             [phone]                                          11/13/2021 Patient:  Dean Mendez Pre-Op Dx: NSTEMI   Left main dissection    3V CAD    Post-op Dx:  same Procedure: Emergency CABG X 2.  LIMA LAD, RSVG RI   Open greater saphenous vein harvest on the left   Surgeon and Role:      * Juline Sanderford, Eliezer Lofts, MD - Primary    * Gaynelle Arabian, PA-C - assisting An experienced assistant was required given the complexity of this surgery and the standard of surgical care. The assistant was needed for exposure, dissection, suctioning, retraction of delicate tissues and sutures, instrument exchange and for overall help during this procedure.    Anesthesia  general EBL:  Blood Administration: 1pPlts Xclamp Time:  38 min Pump Time:   Drains: 37 F blake drain: L, mediastinal  Wires: Ventricular Counts: correct   Indications: 70 year old gentleman that transferred from [facility] with an NSTEMI.  He went to the Cath Lab today for PCI, but during intervention to the circumflex vessel left main coronary artery dissection was noted.  CTS was consulted.  He had decreased flow going down his LAD, and had chest pain and the recovery area.  He was taken emergently to the OR for revascularization of his LAD and ramus. Findings: Small LIMA.  Good vein.  There was a hemopericardium that was noted upon entry.  There was bruising and mottling along the aorta along the AP window which tracked posterior along the aorta.  We confirmed that there was no dissection of the aorta with Intra-Op TEE.  There was no obvious bleeding from the AP window.  The ramus intermedius was a very large target.  There were no good targets on the lateral wall.  The LAD was intramyocardial but it was fairly decent target.    Operative Technique: All invasive lines were placed in pre-op holding.  After the risks, benefits  and alternatives were thoroughly discussed, the patient was brought to the operative theatre.  Anesthesia was induced, and the patient was prepped and draped in normal sterile fashion.  An appropriate surgical pause was performed, and pre-operative antibiotics were dosed accordingly.  We began with simultaneous incisions along the left leg for harvesting of the greater saphenous vein and the chest for the sternotomy.  In regards to the sternotomy, this was carried down with bovie cautery, and the sternum was divided with a reciprocating saw.  Meticulous hemostasis was obtained.  The left internal thoracic artery was exposed and harvested in in pedicled fashion.  The patient was systemically heparinized, and the artery was divided distally, and placed in a papaverine sponge.    The sternal elevator was removed, and a retractor was placed.  The pericardium was divided in the midline and fashioned into a cradle with pericardial stitches.   After we confirmed an appropriate ACT, the ascending aorta was cannulated in standard fashion.  The right atrial appendage was used for venous cannulation site.  Cardiopulmonary bypass was initiated, and the heart retractor was placed. The cross clamp was applied, and a dose of anterograde cardioplegia  was given with good arrest of the heart.  Next we exposed the lateral wall, and found a good target on the Ramus.  An end to side anastomosis with the vein graft was then created.  Finally, we exposed a good target on the LAD, and fashioned an end to side anastomosis between it and the LITA.  We began to re-warm, and a re-animation dose of cardioplegia was given.  The heart was de-aired, and the cross clamp was removed.  Meticulous hemostasis was obtained.    A partial occludding clamp was then placed on the ascending aorta, and we created an end to side anastomosis between it and the proximal vein graft.  Rings were placed on the proximal anastomosis.  Hemostasis was obtained,  and we separated from cardiopulmonary bypass without event.  The heparin was reversed with protamine.  Chest tubes and wires were placed, and the sternum was re-approximated with sternal wires.  The soft tissue and skin were re-approximated wth absorbable suture.    The patient tolerated the procedure without any immediate complications, and was transferred to the ICU in guarded condition.  Dean Mendez Dean Mendez

## 2021-11-13 NOTE — Progress Notes (Signed)
Progress Note  Patient Name: Dean Mendez Date of Encounter: 11/13/2021  [facility] Cardiologist: None   Subjective   Intubated, sedated  Inpatient Medications    Scheduled Meds:  sodium chloride   Intravenous Once   sodium chloride   Intravenous Once   acetaminophen  1,000 mg Oral Q6H   Or   acetaminophen (TYLENOL) oral liquid 160 mg/5 mL  1,000 mg Per Tube Q6H   atorvastatin  40 mg Oral Daily   bisacodyl  10 mg Oral Daily   Or   bisacodyl  10 mg Rectal Daily   chlorhexidine gluconate (MEDLINE KIT)  15 mL Mouth Rinse BID   Chlorhexidine Gluconate Cloth  6 each Topical Daily   docusate sodium  200 mg Oral Daily   insulin aspart  0-15 Units Subcutaneous Q4H   mouth rinse  15 mL Mouth Rinse 10 times per day   [START ON 11/14/2021] pantoprazole  40 mg Oral Daily   sodium chloride flush  3 mL Intravenous Q12H   sodium chloride flush  3 mL Intravenous Q12H   sodium chloride flush  3 mL Intravenous Q12H   Continuous Infusions:  sodium chloride     sodium chloride     sodium chloride 250 mL (11/13/21 0548)   sodium chloride     sodium chloride 1 mL/kg/hr (11/12/21 1103)    ceFAZolin (ANCEF) IV 2 g (11/13/21 0554)   dexmedetomidine (PRECEDEX) IV infusion 1.2 mcg/kg/hr (11/13/21 1000)   famotidine (PEPCID) IV 100 mL/hr at 11/13/21 1000   lactated ringers 10 mL/hr at 11/13/21 1000   lactated ringers     nitroGLYCERIN Stopped (11/13/21 0050)   norepinephrine (LEVOPHED) Adult infusion 2 mcg/min (11/13/21 1000)   phenylephrine (NEO-SYNEPHRINE) Adult infusion 80 mcg/min (11/13/21 1000)   PRN Meds: sodium chloride, sodium chloride, acetaminophen, metoprolol tartrate, midazolam, morphine injection, ondansetron (ZOFRAN) IV, oxyCODONE, sodium chloride flush, sodium chloride flush, traMADol   Vital Signs    Vitals:   11/13/21 0915 11/13/21 0930 11/13/21 0945 11/13/21 1000  BP:  136/78    Pulse: (!) 47 (!) 48 (!) 46 (!) 46  Resp: _0 Temp: 99.5 F (37.5  C) 99.7 F (37.6 C) 99.5 F (37.5 C) 99.5 F (37.5 C)  TempSrc:      SpO2: 100% 100% 100% 100%  Weight:      Height:        Intake/Output Summary (Last 24 hours) at 11/13/2021 1009 Last data filed at 11/13/2021 1000 Gross per 24 hour  Intake 4044.15 ml  Output 4140 ml  Net -95.85 ml   Last 3 Weights 11/11/2021 10/26/2019  Weight (lbs) 198 lb 195 lb  Weight (kg) 89.812 kg 88.451 kg      Telemetry    Sinus rhythm, sinus tach - Personally Reviewed   Physical Exam  Intubated, sedated GEN: as above Neck: No JVD Cardiac: RRR, no murmurs, rubs, or gallops.  Respiratory: Clear to auscultation bilaterally. Chest tubes in place GI: Soft, non-distended  MS: No edema; No deformity. IABP site clear with no hematoma. Large inguinal hernia. Neuro:  Nonfocal  Psych: Normal affect   Labs    High Sensitivity Troponin:   Recent Labs  Lab 11/11/21 1308 11/11/21 1541 11/12/21 0149 11/12/21 0602  TROPONINIHS 13 77* 3,676* 4,175*     Chemistry Recent Labs  Lab 11/11/21 1308 11/12/21 0149 11/12/21 2112 11/12/21 2312 11/12/21 2315 11/12/21 2346 11/12/21 2350 11/13/21 0510 11/13/21 0511 11/13/21 0701  NA 139 136   < >  140   < > 140   < > 139 144 144  K 3.3* 3.0*   < > 4.6   < > 4.4   < > 4.6 4.6 4.6  CL 103 104   < > 107  --  109  --  110  --   --   CO2 28 24  --   --   --   --   --  23  --   --   GLUCOSE 98 127*   < > 82  --  82  --  108*  --   --   BUN 15 13   < > 10  --  10  --  11  --   --   CREATININE 1.34* 1.04   < > 0.80  --  0.90  --  1.20  --   --   CALCIUM 9.2 9.2  --   --   --   --   --  7.5*  --   --   MG  --  2.1  --   --   --   --   --  2.8*  --   --   PROT  --  6.5  --   --   --   --   --   --   --   --   ALBUMIN  --  3.7  --   --   --   --   --   --   --   --   AST  --  34  --   --   --   --   --   --   --   --   ALT  --  17  --   --   --   --   --   --   --   --   ALKPHOS  --  76  --   --   --   --   --   --   --   --   BILITOT  --  0.6  --   --   --    --   --   --   --   --   GFRNONAA 58* >60  --   --   --   --   --  >60  --   --   ANIONGAP 8 8  --   --   --   --   --  6  --   --    < > = values in this interval not displayed.    Lipids No results for input(s): CHOL, TRIG, HDL, LABVLDL, LDLCALC, CHOLHDL in the last 168 hours.  Hematology Recent Labs  Lab 11/12/21 0149 11/12/21 2112 11/12/21 2312 11/12/21 2315 11/13/21 1610 11/13/21 0214 11/13/21 0510 11/13/21 0511 11/13/21 0701  WBC 7.7  --   --   --  14.1*  --  12.4*  --   --   RBC 5.55  --   --   --  3.47*  --  2.81*  --   --   HGB 15.7   < > 8.4*  8.2*   < > 9.6*   < > 7.9* 8.2* 7.5*  HCT 47.1   < > 25.9*  24.0*   < > 30.2*   < > 24.6* 24.0* 22.0*  MCV 84.9  --   --   --  87.0  --  87.5  --   --  MCH 28.3  --   --   --  27.7  --  28.1  --   --   MCHC 33.3  --   --   --  31.8  --  32.1  --   --   RDW 14.2  --   --   --  14.3  --  14.4  --   --   PLT 233  --  135*  --  165  --  169  --   --    < > = values in this interval not displayed.   Thyroid No results for input(s): TSH, FREET4 in the last 168 hours.  BNPNo results for input(s): BNP, PROBNP in the last 168 hours.  DDimer No results for input(s): DDIMER in the last 168 hours.   Radiology    DG Chest 2 View  Result Date: 11/11/2021 CLINICAL DATA:  Chest pain, syncope EXAM: CHEST - 2 VIEW COMPARISON:  Previous studies including the CT chest done on 07/06/2019 FINDINGS: Cardiac size is within normal limits. Thoracic aorta is tortuous and ectatic. There are no signs of alveolar pulmonary edema or focal pulmonary consolidation. Centrilobular and panlobular emphysema is seen in the upper lung fields. Linear density in the medial right lower lung fields most likely suggests minimal scarring. There is no pleural effusion or pneumothorax. IMPRESSION: COPD. There are no new infiltrates or signs of pulmonary edema. Linear density in the medial right lower lung fields most likely suggests scarring. Electronically Signed   By:  Elmer Picker M.D.   On: 11/11/2021 13:16   CT Angio Chest PE W and/or Wo Contrast  Result Date: 11/11/2021 CLINICAL DATA:  PE suspected, high prob Chest pain and shortness of breath after fall earlier today. EXAM: CT ANGIOGRAPHY CHEST WITH CONTRAST TECHNIQUE: Multidetector CT imaging of the chest was performed using the standard protocol during bolus administration of intravenous contrast. Multiplanar CT image reconstructions and MIPs were obtained to evaluate the vascular anatomy. CONTRAST:  17m OMNIPAQUE IOHEXOL 350 MG/ML SOLN COMPARISON:  Radiograph earlier today.  Chest CT 07/06/2019 FINDINGS: Cardiovascular: There are no filling defects within the pulmonary arteries to suggest pulmonary embolus. Atherosclerosis of the thoracic aorta which is tortuous. No aneurysm or acute aortic findings. The heart is normal in size. There are coronary artery calcifications. No pericardial effusion. Mediastinum/Nodes: No enlarged mediastinal or hilar lymph nodes. No esophageal wall thickening. No visualized thyroid nodule Lungs/Pleura: Advanced emphysema. Bandlike atelectasis or scarring in the right lower lobe. Subsegmental atelectasis in the left lower lobe. No acute airspace disease. No pleural effusion. No pneumothorax. There is retained mucus within the dependent trachea. No dominant pulmonary mass. Previous perifissural lingular nodule has resolved in the interim. Upper Abdomen: No acute findings.  No upper abdominal free fluid. Musculoskeletal: No acute rib fracture. No acute osseous abnormalities are seen. Thoracic spondylosis with endplate spurring. No confluent chest wall contusion. Review of the MIP images confirms the above findings. IMPRESSION: 1. No pulmonary embolus or acute intrathoracic abnormality. 2. Moderately advanced emphysema. Minimal retained mucus in the trachea. 3. Aortic atherosclerosis.  Coronary artery calcifications. Aortic Atherosclerosis (ICD10-I70.0) and Emphysema (ICD10-J43.9).  Electronically Signed   By: MKeith RakeM.D.   On: 11/11/2021 20:14   CARDIAC CATHETERIZATION  Result Date: 11/12/2021   Successful balloon pump placement via RFA sheath.  Confirmed by fluoroscopy..Marland Kitchen  CARDIAC CATHETERIZATION  Result Date: 11/12/2021   Ost LM to Prox LAD lesion is 30% stenosed with 80% stenosed side branch in [place]  Cx.  Post intervention, there is a 50% residual stenosis with distal LM dissection.  Post attempted intervention, the OM side branch was reduced to 80% residual stenosis.   Ramus-1 lesion is 40% stenosed. - mid 90%, distal mid thrombotic 90%.   Mid Cx to Dist Cx lesion is 95% stenosed. - plan was to attempt PCI   multiple wires were tried - unable to cross lesion   Post intervention, there is a 99% residual stenosis. TIMI 1 flow noted.   Prox RCA to Mid RCA lesion is 75% stenosed. non-dominant vessel   There is mild to moderate left ventricular systolic dysfunction.  The left ventricular ejection fraction is 45-50% by visual estimate.   LV end diastolic pressure is normal.   There is no aortic valve stenosis. 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  The downstream vessel has pruning with what appears to be possible distal embolization. LCx is an unusual takeoff, there is diffuse moderate 70% disease followed by a hairpin turn where there is apparently an occluded OM 2 branch.  There is a 99% stenosis at this lesion that is heavily calcified. RCA appears to be nondominant heavily diseased small caliber vessel.  Original plan was to attempt PTCA/PCI of the LCx with Aggrastat infusion overnight and planned attempted PCI of the ramus intermedius on Friday. Attempted PTCA of the  circumflex was performed using [place], [place] PT and whisper wires.  The vessel was very difficult to wire and was not able to reach beyond the lesion.  Imaging after wire removal revealed that the vessel was now no longer subtotally occluded but appeared to have total occlusion with TIMI I flow distally.  At this point I chose to abort for further procedures.  On continued to of post wiring angiography, there appears to be dye hang up in the left main into the aortic root concerning for possible dissection.   Aggrastat bolus had been given but was discontinued.  CVTS consult called for potential emergent surgery.   EF appears to be 45 to 50%.  There appears to be apical inferior and mid to distal lateral-inferolateral hypokinesis. Normal LVEDP.  PATIENT DISPOSITION:  PACU - hemodynamically stable.  Stat echocardiogram called  CVTS consult call with Dr. Kipp Brood -> likely emergent surgery.  Would like to consider CABG.  Antiplatelet agents have not stopped. PLAN OF CARE:  The patient has been already been admitted to 6 E., however, the plan after discussion with Dr. Kipp Brood is to place IABP & go to OR   DG CHEST PORT 1 VIEW  Result Date: 11/13/2021 CLINICAL DATA:  Chest tube reposition. EXAM: PORTABLE CHEST 1 VIEW COMPARISON:  11/13/2021 prior studies FINDINGS: Cardiomegaly, CABG changes, endotracheal tube with tip 5.3 cm above the carina, NG tube with side hole overlying the distal esophagus, RIGHT IJ central venous catheter, intra-aortic balloon pump and mediastinal/thoracostomy tubes are again noted and not significantly changed except for the RIGHT thoracostomy tube which has been somewhat retracted since the prior study. There may be a miniscule RIGHT apical pneumothorax present. Bilateral LOWER lung atelectasis is again noted. IMPRESSION: Slight retraction of RIGHT thoracostomy tube with other support apparatus unchanged. Question miniscule RIGHT apical pneumothorax. Electronically Signed    By: Dellis Filbert  Hu M.D.   On: 11/13/2021 10:05   DG CHEST PORT 1 VIEW  Result Date: 11/13/2021 CLINICAL DATA:  Chest tube insertion in a 70 year old male. EXAM: PORTABLE CHEST 1 VIEW COMPARISON:  November 13, 2021. FINDINGS: RIGHT-sided IJ vascular sheath transmits a Swan-Ganz catheter, tip in the proximal to mid superior vena cava. Endotracheal tube with tip between clavicular heads approximately 6 cm from the carina. Gastric tube tip in the proximal stomach, side port likely in distal esophagus, retracted slightly since previous imaging. Mediastinal chest support tube and LEFT chest tube remain in place. Interval insertion of a RIGHT-sided chest tube with persistent moderate size RIGHT pneumothorax despite insertion of chest tube. Suspect volume loss along the medial RIGHT lung base. This contour along the RIGHT heart border shows no interval change. Improved appearance of interstitial and airspace opacities in the LEFT chest when compared to previous imaging. No LEFT-sided pneumothorax. Cardiomediastinal contours are stable again with some mildly lobular added density along the RIGHT heart border. Changes of median sternotomy for CABG as before. On limited assessment no acute skeletal process. IMPRESSION: Still with moderate-size RIGHT pneumothorax despite placement of RIGHT-sided chest tube. Improved appearance of the LEFT chest may reflect resolving edema. Attention on follow-up. Mildly lobular added density along the RIGHT heart border may be related to postoperative change or volume loss in the RIGHT lower lobe but shows no change in terms of mediastinal contour, attention on follow-up. Slight interval retraction of the patient's gastric tube, side port likely in the distal aspect of the esophagus. These results will be called to the ordering clinician or representative by the Radiologist Assistant, and communication documented in the PACS or [employer]. Electronically Signed   By: Zetta Bills M.D.    On: 11/13/2021 09:44   DG Chest Port 1 View  Result Date: 11/13/2021 CLINICAL DATA:  Status post intubation. EXAM: PORTABLE CHEST 1 VIEW COMPARISON:  Chest radiograph dated 11/11/2021 and CT dated 11/11/2021 FINDINGS: Endotracheal tube with tip approximately 4.5 cm above the carina. Right IJ central venous line with tip over upper SVC. Enteric tube extends below the diaphragm with tip beyond the inferior margin of the image. Inferiorly accessed mediastinal and left chest drain. There is diffuse interstitial density throughout the left lung. There is a moderate size right pneumothorax measuring approximately 2.5 cm to the lateral pleural surface. There is mild enlargement of the cardiomediastinal silhouette. No acute osseous pathology. IMPRESSION: 1. Endotracheal tube above the carina. 2. Moderate size right pneumothorax. 3. Diffuse interstitial density throughout the left lung concerning for pneumonia. Follow-up recommended. These results were called by telephone at the time of interpretation on 11/13/2021 at 1:25 am to Nurse Luna Glasgow, who verbally acknowledged these results. Electronically Signed   By: Anner Crete M.D.   On: 11/13/2021 01:36   ECHOCARDIOGRAM COMPLETE  Result Date: 11/12/2021    ECHOCARDIOGRAM REPORT   Patient Name:   Dean Mendez Date of Exam: 11/12/2021 Medical Rec #:  [id number]             Height:       72.0 in Accession #:    [id number]            Weight:       198.0 lb Date of Birth:  11-22-1951             BSA:          2.121 m Patient Age:    89 years  BP:           158/90 mmHg Patient Gender: M                     HR:           54 bpm. Exam Location:  Inpatient Procedure: 2D Echo STAT ECHO Indications:    aortic dissection  History:        Patient has prior history of Echocardiogram examinations, most                 recent 11/12/2021. Signs/Symptoms:Chest Pain; Risk                 Factors:Dyslipidemia and Hypertension.  Sonographer:    Johny Chess RDCS Referring Phys: [place]  1. Left ventricular ejection fraction, by estimation, is 55 to 60%. The left ventricle has normal function. The left ventricle demonstrates regional wall motion abnormalities (see scoring diagram/findings for description). There is mild left ventricular  hypertrophy. Left ventricular diastolic parameters are indeterminate.  2. Right ventricular systolic function is normal. The right ventricular size is normal. Tricuspid regurgitation signal is inadequate for assessing PA pressure.  3. There is a trivial pericardial effusion posterior to the left ventricle and anterior to the right ventricle.  4. The mitral valve is grossly normal. Trivial mitral valve regurgitation.  5. The aortic valve is tricuspid. Aortic valve regurgitation is not visualized. Aortic valve sclerosis is present, with no evidence of aortic valve stenosis.  6. The inferior vena cava is normal in size with greater than 50% respiratory variability, suggesting right atrial pressure of 3 mmHg. Comparison(s): Prior images reviewed side by side. LVEF 55-60% with small area of apical anteroseptal hypokinesis that looks to have present on the prior study. Also trivial anterior and posterior pericardial effusion. FINDINGS  Left Ventricle: Left ventricular ejection fraction, by estimation, is 55 to 60%. The left ventricle has normal function. The left ventricle demonstrates regional wall motion abnormalities. The left ventricular internal cavity size was normal in size. There is mild left ventricular hypertrophy. Left ventricular diastolic parameters are indeterminate.  LV Wall Scoring: The apical septal segment is hypokinetic. The entire anterior wall, entire lateral wall, anterior septum, entire inferior wall, mid inferoseptal segment, basal inferoseptal segment, and apex are normal. Right Ventricle: The right ventricular size is normal. No increase in right ventricular wall thickness. Right  ventricular systolic function is normal. Tricuspid regurgitation signal is inadequate for assessing PA pressure. Left Atrium: Left atrial size was normal in size. Right Atrium: Right atrial size was normal in size. Pericardium: Trivial pericardial effusion is present. The pericardial effusion is posterior to the left ventricle and anterior to the right ventricle. Mitral Valve: The mitral valve is grossly normal. Trivial mitral valve regurgitation. Tricuspid Valve: The tricuspid valve is grossly normal. Tricuspid valve regurgitation is trivial. Aortic Valve: The aortic valve is tricuspid. There is mild aortic valve annular calcification. Aortic valve regurgitation is not visualized. Aortic valve sclerosis is present, with no evidence of aortic valve stenosis. Pulmonic Valve: The pulmonic valve was grossly normal. Pulmonic valve regurgitation is not visualized. Aorta: The aortic root is normal in size and structure. Venous: The inferior vena cava is normal in size with greater than 50% respiratory variability, suggesting right atrial pressure of 3 mmHg. IAS/Shunts: No atrial level shunt detected by color flow Doppler.  LEFT VENTRICLE PLAX 2D LVIDd:         3.50 cm  Diastology LVIDs:         2.20 cm     LV e' medial:    6.31 cm/s LV PW:         1.20 cm     LV E/e' medial:  11.8 LV IVS:        1.00 cm     LV e' lateral:   6.74 cm/s LVOT diam:     2.00 cm     LV E/e' lateral: 11.0 LV SV:         57 LV SV Index:   27 LVOT Area:     3.14 cm  LV Volumes (MOD) LV vol d, MOD A4C: 57.4 ml LV vol s, MOD A4C: 31.4 ml LV SV MOD A4C:     57.4 ml RIGHT VENTRICLE             IVC RV S prime:     11.20 cm/s  IVC diam: 1.60 cm TAPSE (M-mode): 2.2 cm LEFT ATRIUM             Index        RIGHT ATRIUM           Index LA diam:        3.00 cm 1.41 cm/m   RA Area:     13.40 cm LA Vol (A2C):   36.2 ml 17.06 ml/m  RA Volume:   35.80 ml  16.88 ml/m LA Vol (A4C):   26.0 ml 12.26 ml/m LA Biplane Vol: 30.9 ml 14.57 ml/m  AORTIC VALVE  LVOT Vmax:   79.20 cm/s LVOT Vmean:  50.700 cm/s LVOT VTI:    0.183 m  AORTA Ao Root diam: 3.00 cm Ao Asc diam:  2.80 cm MITRAL VALVE MV Area (PHT): 3.60 cm    SHUNTS MV Decel Time: 211 msec    Systemic VTI:  0.18 m MV E velocity: 74.40 cm/s  Systemic Diam: 2.00 cm MV A velocity: 71.00 cm/s MV E/A ratio:  1.05 Rozann Lesches MD Electronically signed by Rozann Lesches MD Signature Date/Time: 11/12/2021/7:08:03 PM    Final    ECHOCARDIOGRAM COMPLETE  Result Date: 11/12/2021    ECHOCARDIOGRAM REPORT   Patient Name:   Dean Mendez Date of Exam: 11/12/2021 Medical Rec #:  [id number]             Height:       72.0 in Accession #:    [id number]            Weight:       198.0 lb Date of Birth:  [birth year]             BSA:          2.121 m Patient Age:    70 years              BP:           168/97 mmHg Patient Gender: M                     HR:           67 bpm. Exam Location:  [facility] Procedure: 2D Echo, Cardiac Doppler and Color Doppler Indications:    Chest Pain  History:        Patient has no prior history of Echocardiogram examinations.                 Signs/Symptoms:Chest Pain and Syncope; Risk Factors:Hypertension  and Dyslipidemia.  Sonographer:    Wenda Low Referring Phys: [id number] [name] IMPRESSIONS  1. Left ventricular ejection fraction, by estimation, is 60 to 65%. The left ventricle has normal function. The left ventricle has no regional wall motion abnormalities. There is moderate left ventricular hypertrophy. Left ventricular diastolic parameters are consistent with Grade I diastolic dysfunction (impaired relaxation).  2. Right ventricular systolic function is normal. The right ventricular size is normal. There is normal pulmonary artery systolic pressure.  3. The mitral valve is normal in structure. No evidence of mitral valve regurgitation. No evidence of mitral stenosis.  4. The aortic valve is tricuspid. Aortic valve regurgitation is not visualized. No aortic  stenosis is present.  5. The inferior vena cava is normal in size with greater than 50% respiratory variability, suggesting right atrial pressure of 3 mmHg. FINDINGS  Left Ventricle: Left ventricular ejection fraction, by estimation, is 60 to 65%. The left ventricle has normal function. The left ventricle has no regional wall motion abnormalities. The left ventricular internal cavity size was normal in size. There is  moderate left ventricular hypertrophy. Left ventricular diastolic parameters are consistent with Grade I diastolic dysfunction (impaired relaxation). Normal left ventricular filling pressure. Right Ventricle: The right ventricular size is normal. No increase in right ventricular wall thickness. Right ventricular systolic function is normal. There is normal pulmonary artery systolic pressure. The tricuspid regurgitant velocity is 2.17 m/s, and  with an assumed right atrial pressure of 3 mmHg, the estimated right ventricular systolic pressure is 46.2 mmHg. Left Atrium: Left atrial size was normal in size. Right Atrium: Right atrial size was normal in size. Pericardium: There is no evidence of pericardial effusion. Mitral Valve: The mitral valve is normal in structure. No evidence of mitral valve regurgitation. No evidence of mitral valve stenosis. MV peak gradient, 2.8 mmHg. The mean mitral valve gradient is 1.0 mmHg. Tricuspid Valve: The tricuspid valve is normal in structure. Tricuspid valve regurgitation is mild . No evidence of tricuspid stenosis. Aortic Valve: The aortic valve is tricuspid. Aortic valve regurgitation is not visualized. No aortic stenosis is present. Aortic valve mean gradient measures 5.0 mmHg. Aortic valve peak gradient measures 8.8 mmHg. Aortic valve area, by VTI measures 2.20 cm. Pulmonic Valve: The pulmonic valve was not well visualized. Pulmonic valve regurgitation is not visualized. No evidence of pulmonic stenosis. Aorta: The aortic root is normal in size and structure.  Venous: The inferior vena cava is normal in size with greater than 50% respiratory variability, suggesting right atrial pressure of 3 mmHg. IAS/Shunts: No atrial level shunt detected by color flow Doppler.  LEFT VENTRICLE PLAX 2D LVIDd:         3.90 cm     Diastology LVIDs:         2.20 cm     LV e' medial:    5.66 cm/s LV PW:         1.40 cm     LV E/e' medial:  13.4 LV IVS:        1.50 cm     LV e' lateral:   7.72 cm/s LVOT diam:     2.00 cm     LV E/e' lateral: 9.8 LV SV:         71 LV SV Index:   33 LVOT Area:     3.14 cm  LV Volumes (MOD) LV vol d, MOD A2C: 59.9 ml LV vol d, MOD A4C: 59.2 ml LV vol s, MOD A2C: 28.4 ml LV  vol s, MOD A4C: 24.1 ml LV SV MOD A2C:     31.5 ml LV SV MOD A4C:     59.2 ml LV SV MOD BP:      33.2 ml RIGHT VENTRICLE RV Basal diam:  3.70 cm RV Mid diam:    3.40 cm RV S prime:     11.30 cm/s TAPSE (M-mode): 2.5 cm LEFT ATRIUM             Index        RIGHT ATRIUM           Index LA diam:        3.30 cm 1.56 cm/m   RA Area:     16.50 cm LA Vol (A2C):   35.8 ml 16.88 ml/m  RA Volume:   48.00 ml  22.63 ml/m LA Vol (A4C):   37.9 ml 17.86 ml/m LA Biplane Vol: 36.6 ml 17.25 ml/m  AORTIC VALVE                     PULMONIC VALVE AV Area (Vmax):    1.97 cm      PV Vmax:       0.71 m/s AV Area (Vmean):   2.07 cm      PV Peak grad:  2.0 mmHg AV Area (VTI):     2.20 cm AV Vmax:           148.00 cm/s AV Vmean:          106.000 cm/s AV VTI:            0.322 m AV Peak Grad:      8.8 mmHg AV Mean Grad:      5.0 mmHg LVOT Vmax:         92.70 cm/s LVOT Vmean:        69.700 cm/s LVOT VTI:          0.225 m LVOT/AV VTI ratio: 0.70  AORTA Ao Root diam: 3.20 cm MITRAL VALVE               TRICUSPID VALVE MV Area (PHT): 4.29 cm    TR Peak grad:   18.8 mmHg MV Area VTI:   3.55 cm    TR Vmax:        217.00 cm/s MV Peak grad:  2.8 mmHg MV Mean grad:  1.0 mmHg    SHUNTS MV Vmax:       0.84 m/s    Systemic VTI:  0.22 m MV Vmean:      51.8 cm/s   Systemic Diam: 2.00 cm MV Decel Time: 177 msec MV E velocity:  75.80 cm/s MV A velocity: 87.40 cm/s MV E/A ratio:  0.87 Carlyle Dolly MD Electronically signed by Carlyle Dolly MD Signature Date/Time: 11/12/2021/10:25:53 AM    Final     Cardiac Studies   Cardiac Cath films reviewed  Patient Profile     70 y.o. male with NSTEMI, undergoes cath and attempted PCI complicated by left main dissection taken emergently for CABG 11/12/2021  Assessment & Plan    NSTEMI, multivessel CAD, left main dissection: emergent CABG 11/23, POD #1.  Currently supported with phenylephrine and low-dose norepinephrine.  Chest tube management per surgical team.  IABP currently 1: 2 with consideration to remove this afternoon as long as chest tube drainage slows down.  Remains intubated. Postoperative blood loss anemia: Transfuse as needed per surgical team. Heart rhythm currently stable, normal sinus.  We will follow with you.  For questions or updates, please contact [place] Please consult [email or web address] for contact info under        Signed, Sherren Mocha, MD  11/13/2021, 10:09 AM

## 2021-11-13 NOTE — Progress Notes (Signed)
Patient ID: Dean Mendez, male   DOB: 1951/10/10, 70 y.o.   MRN: [id number] TCTS:  IABP removed without difficulty. Pressure held for 20 minutes. Good hemostasis. Foot is warm.

## 2021-11-13 NOTE — Progress Notes (Signed)
Radiology called RN in regards to the reading of the chest xray. RN made Dr Cliffton Asters aware of the findings - Right Side Pneumothorax. No new orders given at this time.

## 2021-11-13 NOTE — Transfer of Care (Addendum)
Immediate Anesthesia Transfer of Care Note  Patient: Dean Mendez  Procedure(s) Performed: CORONARY ARTERY BYPASS GRAFTING (CABG) X TWO, USING LEFT INTERNAL MAMMARY ARTERY AND LEFT LEG GREATER SAPHENOUS VEIN - OPEN HARVEST (Chest) TRANSESOPHAGEAL ECHOCARDIOGRAM (TEE) APPLICATION OF CELL SAVER  Patient Location: ICU  Anesthesia Type:General  Level of Consciousness: Patient remains intubated per anesthesia plan  Airway & Oxygen Therapy: Patient remains intubated per anesthesia plan and Patient placed on Ventilator (see vital sign flow sheet for setting)  Post-op Assessment: Report given to RN and Post -op Vital signs reviewed and stable  Post vital signs: Reviewed and stable  Last Vitals:  Vitals Value Taken Time  BP 121/82 11/13/21 0105  Temp 35.4 C 11/13/21 0122  Pulse 37 11/13/21 0122  Resp 12 11/13/21 0122  SpO2 100 % 11/13/21 0122  Vitals shown include unvalidated device data.  Last Pain:  Vitals:   11/12/21 2013  TempSrc:   PainSc: 0-No pain         Complications: No notable events documented.

## 2021-11-13 NOTE — Progress Notes (Signed)
Multiple attempts to contact wife, [name] to get Blood Consent but unable to reach her at this time via home or mobile number. Spoke with patients son, Dean Mendez for an update and he is aware of the need for blood products and the risk associated with it. He consented via phone. He stated him and his mother will be in this afternoon.

## 2021-11-13 NOTE — Progress Notes (Signed)
1 Day Post-Op Procedure(s) (LRB): CORONARY ARTERY BYPASS GRAFTING (CABG) X TWO, USING LEFT INTERNAL MAMMARY ARTERY AND LEFT LEG GREATER SAPHENOUS VEIN - OPEN HARVEST (N/A) TRANSESOPHAGEAL ECHOCARDIOGRAM (TEE) APPLICATION OF CELL SAVER Subjective: Intubated and sedated.   On NE 7. CI 3.5 this am by FT. CVP low.  Chest tube output low overnight but probably clotted because he dumped 380 and 530 this am. I suspect most of this was in left pleural space accounting for left chest haziness seen on CXR at 1 am. There was also a large right ptx. I inserted a right chest tube this am which drained right hemothorax and rush of air. He has persistent air leak from the tube.    Objective: Vital signs in last 24 hours: Temp:  [95 F (35 C)-99.7 F (37.6 C)] 99.7 F (37.6 C) (11/24 0930) Pulse Rate:  [0-77] 48 (11/24 0930) Cardiac Rhythm: Normal sinus rhythm (11/24 0400) Resp:  [0-27] 15 (11/24 0930) BP: (93-163)/(59-104) 136/78 (11/24 0930) SpO2:  [0 %-100 %] 100 % (11/24 0930) Arterial Line BP: (86-199)/(29-90) 124/40 (11/24 0930) FiO2 (%):  [40 %-100 %] 60 % (11/24 0810)  Hemodynamic parameters for last 24 hours: CVP:  [0 mmHg-7 mmHg] 3 mmHg  Intake/Output from previous day: 11/23 0701 - 11/24 0700 In: 3470.9 [I.V.:1924.7; Blood:914; IV Piggyback:632.3] Out: 3110 [Urine:2650; Chest Tube:460] Intake/Output this shift: Total I/O In: 428.9 [I.V.:411; IV Piggyback:17.9] Out: 1430 [Urine:100; Chest Tube:1330]  General appearance: sedated on vent Neurologic: sedated but moving all ext Heart: regular rate and rhythm, S1, S2 normal, no murmur Lungs: clear to auscultation bilaterally Abdomen: soft, large left groin hernia  Extremities: edema moderate Wound: dressing dry  Lab Results: Recent Labs    11/13/21 0213 11/13/21 0214 11/13/21 0510 11/13/21 0511 11/13/21 0701  WBC 14.1*  --  12.4*  --   --   HGB 9.6*   < > 7.9* 8.2* 7.5*  HCT 30.2*   < > 24.6* 24.0* 22.0*  PLT 165  --   169  --   --    < > = values in this interval not displayed.   BMET:  Recent Labs    11/12/21 0149 11/12/21 2112 11/12/21 2346 11/12/21 2350 11/13/21 0510 11/13/21 0511 11/13/21 0701  NA 136   < > 140   < > 139 144 144  K 3.0*   < > 4.4   < > 4.6 4.6 4.6  CL 104   < > 109  --  110  --   --   CO2 24  --   --   --  23  --   --   GLUCOSE 127*   < > 82  --  108*  --   --   BUN 13   < > 10  --  11  --   --   CREATININE 1.04   < > 0.90  --  1.20  --   --   CALCIUM 9.2  --   --   --  7.5*  --   --    < > = values in this interval not displayed.    PT/INR:  Recent Labs    11/13/21 0111  LABPROT 19.4*  INR 1.6*   ABG    Component Value Date/Time   PHART 7.286 (L) 11/13/2021 0701   HCO3 23.5 11/13/2021 0701   TCO2 25 11/13/2021 0701   ACIDBASEDEF 3.0 (H) 11/13/2021 0701   O2SAT 100.0 11/13/2021 0701   CBG (last 3)  Recent Labs    11/13/21 0402 11/13/21 0518 11/13/21 0700  GLUCAP 96 109* 133*   CXr: right lung reexpanded after chest tube insertion. Left pleural effusion drained. Assessment/Plan: S/P Procedure(s) (LRB): CORONARY ARTERY BYPASS GRAFTING (CABG) X TWO, USING LEFT INTERNAL MAMMARY ARTERY AND LEFT LEG GREATER SAPHENOUS VEIN - OPEN HARVEST (N/A) TRANSESOPHAGEAL ECHOCARDIOGRAM (TEE) APPLICATION OF CELL SAVER  POD 1 Hemodynamically stable but requiring NE to support BP. CI good. Will plan to remove IABP today if chest tube output decreases. He was on Brilinta preop. Will give him some plts this morning with increased chest tube output. I am not sure if this is active bleeding on left over blood that was not drained through the small chest tubes.  Keep on vent for now until IABP out and chest tube output decreased.   Acute postop blood loss anemia: Transfused 2 unit this am and will give some plts.   LOS: 1 day    Gaye Pollack 11/13/2021

## 2021-11-13 NOTE — CV Procedure (Signed)
Chest Tube Insertion Procedure Note  Indications:  Large right pneumothorax  Pre-operative Diagnosis: Large right pneumothorax  Post-operative Diagnosis: Same  Procedure Details  Informed consent was obtained for the procedure, including sedation.  Risks of lung perforation, hemorrhage, arrhythmia, and adverse drug reaction were discussed.   After sterile skin prep, using standard technique, a 28 French tube was placed in the right lateral 7th rib space.  Findings: 400 ml of  bloody fluid obtained  Estimated Blood Loss:  Minimal         Specimens:  None              Complications:  None; patient tolerated the procedure well.         Disposition:  performed in ICU         Condition: stable  Attending Attestation: I performed the procedure.

## 2021-11-13 NOTE — Progress Notes (Signed)
  Echocardiogram Echocardiogram Transesophageal has been performed.  Delcie Roch 11/13/2021, 2:37 PM

## 2021-11-14 ENCOUNTER — Telehealth: Payer: Self-pay | Admitting: Physician Assistant

## 2021-11-14 ENCOUNTER — Inpatient Hospital Stay (HOSPITAL_COMMUNITY): Payer: Medicare HMO

## 2021-11-14 ENCOUNTER — Encounter (HOSPITAL_COMMUNITY): Payer: Self-pay | Admitting: Cardiology

## 2021-11-14 ENCOUNTER — Encounter (HOSPITAL_COMMUNITY)
Admission: EM | Disposition: A | Discharge status: Home Health | Payer: Self-pay | Source: Home / Self Care | Attending: Thoracic Surgery (Cardiothoracic Vascular Surgery)

## 2021-11-14 DIAGNOSIS — Z720 Tobacco use: Secondary | ICD-10-CM

## 2021-11-14 DIAGNOSIS — I10 Essential (primary) hypertension: Secondary | ICD-10-CM

## 2021-11-14 DIAGNOSIS — R778 Other specified abnormalities of plasma proteins: Secondary | ICD-10-CM | POA: Diagnosis not present

## 2021-11-14 DIAGNOSIS — R079 Chest pain, unspecified: Secondary | ICD-10-CM

## 2021-11-14 DIAGNOSIS — I2542 Coronary artery dissection: Secondary | ICD-10-CM

## 2021-11-14 LAB — BASIC METABOLIC PANEL
Anion gap: 4 — ABNORMAL LOW (ref 5–15)
Anion gap: 6 (ref 5–15)
BUN: 16 mg/dL (ref 8–23)
BUN: 17 mg/dL (ref 8–23)
CO2: 21 mmol/L — ABNORMAL LOW (ref 22–32)
CO2: 23 mmol/L (ref 22–32)
Calcium: 7.7 mg/dL — ABNORMAL LOW (ref 8.9–10.3)
Calcium: 7.9 mg/dL — ABNORMAL LOW (ref 8.9–10.3)
Chloride: 112 mmol/L — ABNORMAL HIGH (ref 98–111)
Chloride: 115 mmol/L — ABNORMAL HIGH (ref 98–111)
Creatinine, Ser: 1.39 mg/dL — ABNORMAL HIGH (ref 0.61–1.24)
Creatinine, Ser: 1.49 mg/dL — ABNORMAL HIGH (ref 0.61–1.24)
GFR, Estimated: 51 mL/min — ABNORMAL LOW (ref >60)
GFR, Estimated: 55 mL/min — ABNORMAL LOW (ref >60)
Glucose, Bld: 137 mg/dL — ABNORMAL HIGH (ref 70–99)
Glucose, Bld: 155 mg/dL — ABNORMAL HIGH (ref 70–99)
Potassium: 4.1 mmol/L (ref 3.5–5.1)
Potassium: 4.1 mmol/L (ref 3.5–5.1)
Sodium: 139 mmol/L (ref 135–145)
Sodium: 142 mmol/L (ref 135–145)

## 2021-11-14 LAB — PREPARE FRESH FROZEN PLASMA
Unit division: 0
Unit division: 0
Unit division: 0

## 2021-11-14 LAB — BPAM FFP
Blood Product Expiration Date: 202211272359
Blood Product Expiration Date: 202211282359
Blood Product Expiration Date: 202211282359
Blood Product Expiration Date: 202211282359
ISSUE DATE / TIME: 202211232038
ISSUE DATE / TIME: 202211232038
ISSUE DATE / TIME: 202211232038
ISSUE DATE / TIME: 202211250801
Unit Type and Rh: 600
Unit Type and Rh: 6200
Unit Type and Rh: 6200
Unit Type and Rh: 6200

## 2021-11-14 LAB — GLUCOSE, CAPILLARY
Glucose-Capillary: 100 mg/dL — ABNORMAL HIGH (ref 70–99)
Glucose-Capillary: 101 mg/dL — ABNORMAL HIGH (ref 70–99)
Glucose-Capillary: 102 mg/dL — ABNORMAL HIGH (ref 70–99)
Glucose-Capillary: 104 mg/dL — ABNORMAL HIGH (ref 70–99)
Glucose-Capillary: 106 mg/dL — ABNORMAL HIGH (ref 70–99)
Glucose-Capillary: 132 mg/dL — ABNORMAL HIGH (ref 70–99)
Glucose-Capillary: 135 mg/dL — ABNORMAL HIGH (ref 70–99)
Glucose-Capillary: 147 mg/dL — ABNORMAL HIGH (ref 70–99)

## 2021-11-14 LAB — CBC
HCT: 26.5 % — ABNORMAL LOW (ref 39.0–52.0)
HCT: 26.9 % — ABNORMAL LOW (ref 39.0–52.0)
Hemoglobin: 8.9 g/dL — ABNORMAL LOW (ref 13.0–17.0)
Hemoglobin: 9.3 g/dL — ABNORMAL LOW (ref 13.0–17.0)
MCH: 28.8 pg (ref 26.0–34.0)
MCH: 29.3 pg (ref 26.0–34.0)
MCHC: 33.6 g/dL (ref 30.0–36.0)
MCHC: 34.6 g/dL (ref 30.0–36.0)
MCV: 84.9 fL (ref 80.0–100.0)
MCV: 85.8 fL (ref 80.0–100.0)
Platelets: 122 10*3/uL — ABNORMAL LOW (ref 150–400)
Platelets: 128 10*3/uL — ABNORMAL LOW (ref 150–400)
RBC: 3.09 MIL/uL — ABNORMAL LOW (ref 4.22–5.81)
RBC: 3.17 MIL/uL — ABNORMAL LOW (ref 4.22–5.81)
RDW: 14 % (ref 11.5–15.5)
RDW: 14.1 % (ref 11.5–15.5)
WBC: 10.7 10*3/uL — ABNORMAL HIGH (ref 4.0–10.5)
WBC: 12.5 10*3/uL — ABNORMAL HIGH (ref 4.0–10.5)
nRBC: 0 % (ref 0.0–0.2)
nRBC: 0 % (ref 0.0–0.2)

## 2021-11-14 LAB — LIPID PANEL
Cholesterol: 46 mg/dL (ref 0–200)
HDL: 15 mg/dL — ABNORMAL LOW (ref >40)
LDL Cholesterol: 21 mg/dL (ref 0–99)
Total CHOL/HDL Ratio: 3.1 RATIO
Triglycerides: 49 mg/dL (ref <150)
VLDL: 10 mg/dL (ref 0–40)

## 2021-11-14 LAB — BPAM PLATELET PHERESIS
Blood Product Expiration Date: 202211252359
ISSUE DATE / TIME: 202211241109
Unit Type and Rh: 5100

## 2021-11-14 LAB — POCT I-STAT 7, (LYTES, BLD GAS, ICA,H+H)
Acid-base deficit: 2 mmol/L (ref 0.0–2.0)
Bicarbonate: 22.3 mmol/L (ref 20.0–28.0)
Calcium, Ion: 1.19 mmol/L (ref 1.15–1.40)
HCT: 24 % — ABNORMAL LOW (ref 39.0–52.0)
Hemoglobin: 8.2 g/dL — ABNORMAL LOW (ref 13.0–17.0)
O2 Saturation: 99 %
Patient temperature: 37.5
Potassium: 3.9 mmol/L (ref 3.5–5.1)
Sodium: 144 mmol/L (ref 135–145)
TCO2: 23 mmol/L (ref 22–32)
pCO2 arterial: 33.8 mmHg (ref 32.0–48.0)
pH, Arterial: 7.429 (ref 7.350–7.450)
pO2, Arterial: 116 mmHg — ABNORMAL HIGH (ref 83.0–108.0)

## 2021-11-14 LAB — PREPARE PLATELET PHERESIS: Unit division: 0

## 2021-11-14 LAB — MAGNESIUM: Magnesium: 2.6 mg/dL — ABNORMAL HIGH (ref 1.7–2.4)

## 2021-11-14 LAB — ECHO INTRAOPERATIVE TEE
Height: 72 in
Weight: 3168 oz

## 2021-11-14 SURGERY — CORONARY STENT INTERVENTION
Anesthesia: LOCAL

## 2021-11-14 MED ORDER — ORAL CARE MOUTH RINSE
15.0000 mL | Freq: Two times a day (BID) | OROMUCOSAL | Status: DC
  Administered 2021-11-14 – 2021-11-20 (×12): 15 mL via OROMUCOSAL

## 2021-11-14 MED ORDER — ROSUVASTATIN CALCIUM 20 MG PO TABS: 40.0000 mg | ORAL_TABLET | Freq: Every day | ORAL | Status: DC

## 2021-11-14 MED ORDER — METOPROLOL TARTRATE 12.5 MG HALF TABLET
12.5000 mg | ORAL_TABLET | Freq: Two times a day (BID) | ORAL | Status: DC
  Administered 2021-11-14 – 2021-11-15 (×2): 12.5 mg via ORAL
  Filled 2021-11-14 (×2): qty 1

## 2021-11-14 MED ORDER — ROSUVASTATIN CALCIUM 20 MG PO TABS
40.0000 mg | ORAL_TABLET | Freq: Every day | ORAL | Status: DC
  Administered 2021-11-14 – 2021-11-21 (×8): 40 mg via ORAL
  Filled 2021-11-14 (×8): qty 2

## 2021-11-14 NOTE — Progress Notes (Addendum)
TCTS DAILY ICU PROGRESS NOTE                   [address]            [place] [address]          [phone]   2 Days Post-Op Procedure(s) (LRB): CORONARY ARTERY BYPASS GRAFTING (CABG) X TWO, USING LEFT INTERNAL MAMMARY ARTERY AND LEFT LEG GREATER SAPHENOUS VEIN - OPEN HARVEST (N/A) TRANSESOPHAGEAL ECHOCARDIOGRAM (TEE) APPLICATION OF CELL SAVER  Total Length of Stay:  LOS: 2 days   Subjective: Sedated on vent  Objective: Vital signs in last 24 hours: Temp:  [99.1 F (37.3 C)-100.4 F (38 C)] 99.5 F (37.5 C) (11/25 0700) Pulse Rate:  [41-97] 92 (11/25 0530) Cardiac Rhythm: Normal sinus rhythm (11/25 0400) Resp:  [13-25] 16 (11/25 0700) BP: (88-144)/(58-89) 102/78 (11/25 0700) SpO2:  [98 %-100 %] 100 % (11/25 0728) Arterial Line BP: (72-174)/(29-78) 103/61 (11/25 0700) FiO2 (%):  [40 %-60 %] 40 % (11/25 0728) Weight:  [87.2 kg] 87.2 kg (11/25 0500)  Filed Weights   11/11/21 1248 11/14/21 0500  Weight: 89.8 kg 87.2 kg   Vent Mode: PSV;CPAP FiO2 (%):  [40 %-60 %] 40 % Set Rate:  [16 bmp] 16 bmp Vt Set:  [620 mL] 620 mL PEEP:  [5 cmH20] 5 cmH20 Pressure Support:  [10 cmH20] 10 cmH20 Plateau Pressure:  [15 cmH20-19 cmH20] 15 cmH20  Weight change:    Hemodynamic parameters for last 24 hours: CVP:  [0 mmHg-28 mmHg] 2 mmHg  Intake/Output from previous day: 11/24 0701 - 11/25 0700 In: 3284.9 [I.V.:1896; Blood:901; NG/GT:120; IV Piggyback:367.9] Out: 3235 [Urine:885; Chest Tube:2350]  Intake/Output this shift: No intake/output data recorded.  Current Meds: Scheduled Meds:  sodium chloride   Intravenous Once   sodium chloride   Intravenous Once   acetaminophen  1,000 mg Oral Q6H   Or   acetaminophen (TYLENOL) oral liquid 160 mg/5 mL  1,000 mg Per Tube Q6H   atorvastatin  40 mg Oral Daily   bisacodyl  10 mg Oral Daily   Or   bisacodyl  10 mg Rectal Daily   chlorhexidine gluconate (MEDLINE KIT)  15 mL Mouth Rinse BID   Chlorhexidine Gluconate  Cloth  6 each Topical Daily   docusate sodium  200 mg Oral Daily   insulin aspart  0-15 Units Subcutaneous Q4H   mouth rinse  15 mL Mouth Rinse 10 times per day   pantoprazole  40 mg Oral Daily   sodium chloride flush  3 mL Intravenous Q12H   sodium chloride flush  3 mL Intravenous Q12H   sodium chloride flush  3 mL Intravenous Q12H   Continuous Infusions:  sodium chloride 10 mL/hr at 11/14/21 0700   sodium chloride 250 mL (11/13/21 1447)   sodium chloride Stopped (11/13/21 1446)   sodium chloride     sodium chloride 1 mL/kg/hr (11/12/21 1103)    ceFAZolin (ANCEF) IV Stopped (11/14/21 0533)   dexmedetomidine (PRECEDEX) IV infusion 1.2 mcg/kg/hr (11/14/21 0700)   lactated ringers 10 mL/hr at 11/14/21 0700   lactated ringers     nitroGLYCERIN Stopped (11/13/21 0050)   norepinephrine (LEVOPHED) Adult infusion Stopped (11/13/21 1002)   phenylephrine (NEO-SYNEPHRINE) Adult infusion 11 mcg/min (11/14/21 0700)   PRN Meds:.sodium chloride, sodium chloride, acetaminophen, metoprolol tartrate, midazolam, morphine injection, ondansetron (ZOFRAN) IV, oxyCODONE, sodium chloride flush, sodium chloride flush, traMADol  General appearance: no distress Heart: regular rate and rhythm Lungs: clear ant/lat Abdomen: non  distended Extremities: warm Wound: dressings intact  Lab Results: CBC: Recent Labs    11/14/21 0000 11/14/21 0445 11/14/21 0633  WBC 10.7* 12.5*  --   HGB 9.3* 8.9* 8.2*  HCT 26.9* 26.5* 24.0*  PLT 128* 122*  --    BMET:  Recent Labs    11/14/21 0000 11/14/21 0445 11/14/21 0633  NA 139 142 144  K 4.1 4.1 3.9  CL 112* 115*  --   CO2 21* 23  --   GLUCOSE 155* 137*  --   BUN 17 16  --   CREATININE 1.49* 1.39*  --   CALCIUM 7.7* 7.9*  --     CMET: Lab Results  Component Value Date   WBC 12.5 (H) 11/14/2021   HGB 8.2 (L) 11/14/2021   HCT 24.0 (L) 11/14/2021   PLT 122 (L) 11/14/2021   GLUCOSE 137 (H) 11/14/2021   ALT 17 11/12/2021   AST 34 11/12/2021   NA 144  11/14/2021   K 3.9 11/14/2021   CL 115 (H) 11/14/2021   CREATININE 1.39 (H) 11/14/2021   BUN 16 11/14/2021   CO2 23 11/14/2021   INR 1.6 (H) 11/13/2021   HGBA1C 5.6 11/12/2021      PT/INR:  Recent Labs    11/13/21 0111  LABPROT 19.4*  INR 1.6*   Radiology: DG CHEST PORT 1 VIEW  Result Date: 11/13/2021 CLINICAL DATA:  Chest tube reposition. EXAM: PORTABLE CHEST 1 VIEW COMPARISON:  11/13/2021 prior studies FINDINGS: Cardiomegaly, CABG changes, endotracheal tube with tip 5.3 cm above the carina, NG tube with side hole overlying the distal esophagus, RIGHT IJ central venous catheter, intra-aortic balloon pump and mediastinal/thoracostomy tubes are again noted and not significantly changed except for the RIGHT thoracostomy tube which has been somewhat retracted since the prior study. There may be a miniscule RIGHT apical pneumothorax present. Bilateral LOWER lung atelectasis is again noted. IMPRESSION: Slight retraction of RIGHT thoracostomy tube with other support apparatus unchanged. Question miniscule RIGHT apical pneumothorax. Electronically Signed   By: Margarette Canada M.D.   On: 11/13/2021 10:05   DG CHEST PORT 1 VIEW  Result Date: 11/13/2021 CLINICAL DATA:  Chest tube insertion in a 70 year old male. EXAM: PORTABLE CHEST 1 VIEW COMPARISON:  November 13, 2021. FINDINGS: RIGHT-sided IJ vascular sheath transmits a Swan-Ganz catheter, tip in the proximal to mid superior vena cava. Endotracheal tube with tip between clavicular heads approximately 6 cm from the carina. Gastric tube tip in the proximal stomach, side port likely in distal esophagus, retracted slightly since previous imaging. Mediastinal chest support tube and LEFT chest tube remain in place. Interval insertion of a RIGHT-sided chest tube with persistent moderate size RIGHT pneumothorax despite insertion of chest tube. Suspect volume loss along the medial RIGHT lung base. This contour along the RIGHT heart border shows no interval  change. Improved appearance of interstitial and airspace opacities in the LEFT chest when compared to previous imaging. No LEFT-sided pneumothorax. Cardiomediastinal contours are stable again with some mildly lobular added density along the RIGHT heart border. Changes of median sternotomy for CABG as before. On limited assessment no acute skeletal process. IMPRESSION: Still with moderate-size RIGHT pneumothorax despite placement of RIGHT-sided chest tube. Improved appearance of the LEFT chest may reflect resolving edema. Attention on follow-up. Mildly lobular added density along the RIGHT heart border may be related to postoperative change or volume loss in the RIGHT lower lobe but shows no change in terms of mediastinal contour, attention on follow-up. Slight interval retraction of  the patient's gastric tube, side port likely in the distal aspect of the esophagus. These results will be called to the ordering clinician or representative by the Radiologist Assistant, and communication documented in the PACS or [employer]. Electronically Signed   By: Zetta Bills M.D.   On: 11/13/2021 09:44     Assessment/Plan: S/P Procedure(s) (LRB): CORONARY ARTERY BYPASS GRAFTING (CABG) X TWO, USING LEFT INTERNAL MAMMARY ARTERY AND LEFT LEG GREATER SAPHENOUS VEIN - OPEN HARVEST (N/A) TRANSESOPHAGEAL ECHOCARDIOGRAM (TEE) APPLICATION OF CELL SAVER  1 Tmax 100.4, SBP 88-144, sinus rhythm, IABP removed yesterday On neo, nitro and levo are off. CVP-2 , CI 1.9-21. - put back on levo and neo turned off, SVR 1100's 2 on PSV ventillator, ABG looks good- very sleepy - trying to wean precidex off 3 creat improved to 1.39, fair UOP 4 CT drainage 410 last shift- decreasing trend- keep tubes 5 NGT 120 cc 6 minor leukocytosis- trend increasing, prob reactive 7 expected ABLA trending lower, H/H 8.2/24- close to transfusion threshold, monitor closely 8 CXR - lung fields pretty clear, tiny right apical pntx, no significant  effus/infiltrates 9 reportedly moves all extrem/follows simple commands per nursing- currently too sleepy to respond 10 thrombocytopenia- monitor trends, not on lovenox yet 11 mg++ slightly elevated- monitor 12 ionized calcium is normal 13 K+ normal    John Giovanni PA-C Pager [phone] 11/14/2021 7:42 AM   Agree with above Weaning sedation for extubation On levo @ 3.  Good hemodynamics Will keep tubes for now NPO for now Creat downtrend.  Will hold on diuresis SSI  Continue ICU care  [place]

## 2021-11-14 NOTE — Progress Notes (Signed)
Patient ID: Dean Mendez, male   DOB: [birth year], 70 y.o.   MRN: [id number] TCTS Evening Rounds:  Hemodynamically stable  Rhythm is sinus tachy 125. BP ok so will resume Lopressor.  UO ok  CT output low.

## 2021-11-14 NOTE — Hospital Course (Addendum)
History of Present Illness:   at time of consultation Dean Mendez 70 y.o. male transferred from [facility] with an NSTEMI.  He is also been having syncopal episodes.  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  CTS was consulted urgently and Dr. Cliffton Asters felt the patient will require emergent surgical revascularization.  He was taken to the operating room urgently for the procedure.  Hospital course:  The patient urgently underwent CABG x2 with a LIMA-LAD and SVG-RI.  Patient tolerated the procedure well was taken to the surgical intensive care unit in guarded condition.  Postoperative hospital course:  The patient has remained hemodynamically stable but has required Levophed for inotropic and blood pressure support.  His cardiac indices have been stable.  The intra-aortic balloon pump was removed on postoperative Dean #1.  He has remained on the ventilator and on postoperative Dean #2 he is on PSV/CPAP with hopes to wean.  He has had a significant amount of postoperative chest tube drainage but this has slowed significantly over time.  He does have expected acute blood loss anemia and did require transfusion of packed cells as well as platelets.  He was started on Trinsicon. He does have a postoperative thrombocytopenia which is being monitored over time as well.  It has subsequently resolved.  He did have a reactive leukocytosis but white blood cell count has returned to normal.  Renal function has remained within normal limits and when more stable he will require diuretics.  He has remained neurologically intact.  Blood sugars were managed with usual ICU protocols.  He is not a diabetic. His heart rate was around 120 on 11/26 so Lopressor was increased to 25 mg bid. He was started on Midodrine 5 mg tid for labile BP on 11/27.  This was subsequently stopped on 11/29 his blood pressures tended to be in the hypertensive range.  He has been  somewhat difficult to wean off oxygen and is requiring some diuresis as well as aggressive pulmonary hygiene for atelectasis and effusions.  He is making slow but steady progress in terms of his cardiac rehab.

## 2021-11-14 NOTE — Telephone Encounter (Signed)
    Attention TOC pool,  I am covering cardmaster inbox today and we received a preliminary request to arrange post-hospital f/u for patient. He lives in [place] per chart but no appt availability during necessary timeframe so arranged f/u with Gillian Shields NP @ [facility] location 11/28/21. I outlined this information on AVS.  This patient will need a TOC phone call after discharge. Discharge date TBD (had emergent CABG this week) Follow-up appointment has already been arranged with: Gillian Shields @ [facility] location They are a patient of Dr. Wyline Mood as of this admission.  Thank you! Laurann Montana, PA-C

## 2021-11-14 NOTE — Procedures (Signed)
Extubation Procedure Note  Patient Details:   Name: [name] DOB: 1951/07/10 MRN: [id number]   Airway Documentation:    Vent end date: 11/14/21 Vent end time: 1055   Evaluation  O2 sats: stable throughout Complications: No apparent complications Patient did tolerate procedure well. Bilateral Breath Sounds: Diminished, Clear   Yes NIF-35 FVC-618mL Incentive spirometer instructed, will need assistance  Newt Lukes 11/14/2021, 10:03 AM

## 2021-11-14 NOTE — Anesthesia Postprocedure Evaluation (Signed)
Anesthesia Post Note  Patient: Dean Mendez  Procedure(s) Performed: CORONARY ARTERY BYPASS GRAFTING (CABG) X TWO, USING LEFT INTERNAL MAMMARY ARTERY AND LEFT LEG GREATER SAPHENOUS VEIN - OPEN HARVEST (Chest) TRANSESOPHAGEAL ECHOCARDIOGRAM (TEE) APPLICATION OF CELL SAVER     Patient location during evaluation: SICU Anesthesia Type: General Level of consciousness: sedated and patient remains intubated per anesthesia plan Pain management: pain level controlled Vital Signs Assessment: post-procedure vital signs reviewed and stable Respiratory status: patient remains intubated per anesthesia plan Cardiovascular status: stable Anesthetic complications: no   No notable events documented.  Last Vitals:  Vitals:   11/14/21 0845 11/14/21 0900  BP:  (!) 87/64  Pulse: 76   Resp: 14 11  Temp: 37.5 C 37.5 C  SpO2: 100%     Last Pain:  Vitals:   11/14/21 0800  TempSrc: Bladder  PainSc:                  Lewie Loron

## 2021-11-14 NOTE — Discharge Summary (Signed)
[address]       [place] [address]             [phone]    Physician Discharge Summary  Patient ID: Dean Mendez MRN: [id number] DOB/AGE: 68-May-1952 68 y.o.  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   Chest pain 11/11/2021   Syncope 11/11/2021   Hypokalemia 11/11/2021   Elevated troponin 11/11/2021   Essential hypertension 09/25/2019   CKD (chronic kidney disease) stage 3, GFR 30-59 ml/min (H[facility]) 09/25/2019   Other abnormal glucose 09/25/2019   Mixed hyperlipidemia 09/25/2019     Discharge Diagnoses:  Patient Active Problem List   Diagnosis Date Noted   Hx of CABG 11/13/2021   NSTEMI (non-ST elevated myocardial infarction) ([place]) 11/12/2021   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 male transferred from [facility] with an NSTEMI.  He is also been having syncopal episodes.  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  CTS was consulted urgently and Dr. Kipp Brood felt the patient will require emergent surgical revascularization.  He was taken to the operating room urgently for the procedure.  Hospital Course:  The patient urgently underwent CABG x2 with a LIMA-LAD and SVG-RI.  Patient tolerated the procedure well was taken to the surgical intensive  care unit in guarded condition.  Postoperative Hospital Course:  The patient has remained hemodynamically stable but has required Levophed for inotropic and blood pressure support in the early postoperative phase.Marland Kitchen  His cardiac indices have been stable.  The intra-aortic balloon pump was removed on postoperative day #1.  He has remained on the ventilator and on postoperative day #2 he is on PSV/CPAP and was able to be weaned..  He has had a significant amount of postoperative chest tube drainage but this has slowed significantly over time.  He does have expected acute blood loss anemia and did require transfusion of packed cells as well as platelets.  He was started on Trinsicon. He does have a postoperative thrombocytopenia which is being monitored over time as well.  it has subsequently normalized.  He did have a reactive leukocytosis but white blood cell count has returned to normal.  Renal function has remained within normal limits and when more stable he received diuretics.  He has remained neurologically intact.  Blood sugars were managed with usual ICU protocols.  He is not a diabetic. His heart rate was around 120 on 11/26 so Lopressor was increased to 25 mg bid.  It was continued to be titrated over the hospitalization.  He was started on Midodrine 5 mg tid for labile BP on 11/27.  This was subsequently stopped on 11/29 his blood pressures tended to be in the hypertensive range.  We did not place him on a ARB/ACE-inhibitor due to early AKI and need for  midodrine.  He progressed well in regards to his cardiac rehab and it was originally felt to be a possible candidate for short-term SNF but was doing too well to qualify.  Oxygen saturations remained in the acceptable range at 2 L nasal cannula but we were unable to wean below that.  We have arranged for home oxygen at time of discharge.  Incisions are noted to be healing well without evidence of infection.  He is tolerating diet.  Laboratory values were  felt to be stable.  Most recent creatinine was 1.17 on 11/21/2021.  Recent hemoglobin and hematocrit dated 11/20/2021 were 8.0 and 25.1 respectively.  At the time of discharge the patient is felt to be quite stable.    Consults: cardiology  Significant Diagnostic Studies:  DG Chest 2 View  Result Date: 11/19/2021 CLINICAL DATA:  Follow-up exam EXAM: CHEST - 2 VIEW COMPARISON:  11/16/2021 FINDINGS: Right IJ central line is no longer present. Persistent small bilateral pleural effusions and bibasilar atelectasis. No pneumothorax. Background COPD. Cardiomegaly. IMPRESSION: Persistent small bilateral pleural effusions and bibasilar atelectasis. Electronically Signed   By: Macy Mis M.D.   On: 11/19/2021 08:50   DG Chest 2 View  Result Date: 11/11/2021 CLINICAL DATA:  Chest pain, syncope EXAM: CHEST - 2 VIEW COMPARISON:  Previous studies including the CT chest done on 07/06/2019 FINDINGS: Cardiac size is within normal limits. Thoracic aorta is tortuous and ectatic. There are no signs of alveolar pulmonary edema or focal pulmonary consolidation. Centrilobular and panlobular emphysema is seen in the upper lung fields. Linear density in the medial right lower lung fields most likely suggests minimal scarring. There is no pleural effusion or pneumothorax. IMPRESSION: COPD. There are no new infiltrates or signs of pulmonary edema. Linear density in the medial right lower lung fields most likely suggests scarring. Electronically Signed   By: Elmer Picker M.D.   On: 11/11/2021 13:16   CT Angio Chest PE W and/or Wo Contrast  Result Date: 11/11/2021 CLINICAL DATA:  PE suspected, high prob Chest pain and shortness of breath after fall earlier today. EXAM: CT ANGIOGRAPHY CHEST WITH CONTRAST TECHNIQUE: Multidetector CT imaging of the chest was performed using the standard protocol during bolus administration of intravenous contrast. Multiplanar CT image reconstructions and MIPs were obtained to evaluate  the vascular anatomy. CONTRAST:  56mL OMNIPAQUE IOHEXOL 350 MG/ML SOLN COMPARISON:  Radiograph earlier today.  Chest CT 07/06/2019 FINDINGS: Cardiovascular: There are no filling defects within the pulmonary arteries to suggest pulmonary embolus. Atherosclerosis of the thoracic aorta which is tortuous. No aneurysm or acute aortic findings. The heart is normal in size. There are coronary artery calcifications. No pericardial effusion. Mediastinum/Nodes: No enlarged mediastinal or hilar lymph nodes. No esophageal wall thickening. No visualized thyroid nodule Lungs/Pleura: Advanced emphysema. Bandlike atelectasis or scarring in the right lower lobe. Subsegmental atelectasis in the left lower lobe. No acute airspace disease. No pleural effusion. No pneumothorax. There is retained mucus within the dependent trachea. No dominant pulmonary mass. Previous perifissural lingular nodule has resolved in the interim. Upper Abdomen: No acute findings.  No upper abdominal free fluid. Musculoskeletal: No acute rib fracture. No acute osseous abnormalities are seen. Thoracic spondylosis with endplate spurring. No confluent chest wall contusion. Review of the MIP images confirms the above findings. IMPRESSION: 1. No pulmonary embolus or acute intrathoracic abnormality. 2. Moderately advanced emphysema. Minimal retained mucus in the trachea. 3. Aortic atherosclerosis.  Coronary artery calcifications. Aortic Atherosclerosis (ICD10-I70.0) and Emphysema (ICD10-J43.9). Electronically Signed   By:  Keith Rake M.D.   On: 11/11/2021 20:14   CARDIAC CATHETERIZATION  Result Date: 11/12/2021   Successful balloon pump placement via RFA sheath.  Confirmed by fluoroscopy.Marland Kitchen   CARDIAC CATHETERIZATION  Result Date: 11/12/2021   Ost LM to Prox LAD lesion is 30% stenosed with 80% stenosed side branch in Ost Cx.  Post intervention, there is a 50% residual stenosis with distal LM dissection.  Post attempted intervention, the OM side branch  was reduced to 80% residual stenosis.   Ramus-1 lesion is 40% stenosed. - mid 90%, distal mid thrombotic 90%.   Mid Cx to Dist Cx lesion is 95% stenosed. - plan was to attempt PCI   multiple wires were tried - unable to cross lesion   Post intervention, there is a 99% residual stenosis. TIMI 1 flow noted.   Prox RCA to Mid RCA lesion is 75% stenosed. non-dominant vessel   There is mild to moderate left ventricular systolic dysfunction.  The left ventricular ejection fraction is 45-50% by visual estimate.   LV end diastolic pressure is normal.   There is no aortic valve stenosis. 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  The downstream vessel has pruning with what appears to be possible distal embolization. LCx is an unusual takeoff, there is diffuse moderate 70% disease followed by a hairpin turn where there is apparently an occluded OM 2 branch.  There is a 99% stenosis at this lesion that is heavily calcified. RCA appears to be nondominant heavily diseased small caliber vessel.  Original plan was to attempt PTCA/PCI of the LCx with Aggrastat infusion overnight and planned attempted PCI of the ramus intermedius on Friday. Attempted PTCA of the circumflex was performed using [place], Choice PT and whisper wires.  The vessel was very difficult to wire and was not able to reach beyond the lesion.  Imaging after wire removal revealed that the vessel was now no longer subtotally occluded but appeared to have total occlusion with TIMI I flow distally.  At this point I chose to abort for further procedures.  On continued to of post wiring angiography, there appears to be dye hang up in the left main into the aortic  root concerning for possible dissection.   Aggrastat bolus had been given but was discontinued.  CVTS consult called for potential emergent surgery.   EF appears to be 45 to 50%.  There appears to be apical inferior and mid to distal lateral-inferolateral hypokinesis. Normal LVEDP.  PATIENT DISPOSITION:  PACU - hemodynamically stable.  Stat echocardiogram called  CVTS consult call with Dr. Kipp Brood -> likely emergent surgery.  Would like to consider CABG.  Antiplatelet agents have not stopped. PLAN OF CARE:  The patient has been already been admitted to 6 E., however, the plan after discussion with Dr. Kipp Brood is to place IABP & go to OR   DG CHEST PORT 1 VIEW  Result Date: 11/16/2021 CLINICAL DATA:  Postop from CABG. EXAM: PORTABLE CHEST 1 VIEW COMPARISON:  11/15/2021 FINDINGS: Right jugular central venous catheter remains in place with tip overlying the proximal SVC. No pneumothorax visualized. Stable cardiomegaly and ectasia of thoracic aorta. Previously seen left chest tube is been removed. Small bilateral pleural effusions are unchanged. Persistent  atelectasis is seen in both lung bases. Pulmonary hyperinflation is again seen, consistent with COPD. IMPRESSION: Status post left chest tube removal. No pneumothorax visualized. Stable small bilateral pleural effusions and bibasilar atelectasis. Stable cardiomegaly and COPD. Electronically Signed   By: Marlaine Hind M.D.   On: 11/16/2021 08:18   DG CHEST PORT 1 VIEW  Result Date: 11/15/2021 CLINICAL DATA:  70 year old male status post CABG. EXAM: PORTABLE CHEST 1 VIEW COMPARISON:  Chest x-ray 11/14/2021. FINDINGS: Patient has been extubated. Previously noted nasogastric tube has been removed. Right IJ Cordis in position with what appears to be a Swan-Ganz catheter, the tip of which is in the proximal superior vena cava. Right chest tube with tip in the mid to lower right hemithorax. Left-sided chest tube with tip in the base of the left hemithorax.  Mediastinal/pericardial drains again noted. Lung volumes are normal. Bibasilar opacities most compatible with areas of postoperative atelectasis, with superimposed small bilateral pleural effusions (left greater than right). No appreciable pneumothorax. No evidence of pulmonary edema. Mild enlargement of the cardiopericardial silhouette. Upper mediastinal contours are within normal limits. Atherosclerosis in the thoracic aorta. Status post median sternotomy for CABG. IMPRESSION: 1. Support apparatus, as above. 2. Bibasilar opacities most likely reflect areas of postoperative atelectasis with superimposed small bilateral pleural effusions (left-greater-than-right). 3. Aortic atherosclerosis. Electronically Signed   By: Vinnie Langton M.D.   On: 11/15/2021 08:25   DG CHEST PORT 1 VIEW  Result Date: 11/14/2021 CLINICAL DATA:  Status post cardiac surgery EXAM: PORTABLE CHEST 1 VIEW COMPARISON:  Previous studies including the examination of 11/13/2021 FINDINGS: Tip of endotracheal tube is 8.7 cm above the carina. Tip and side-port in the enteric tube are noted in the lower thoracic esophagus. Tip central venous catheter is seen in the superior vena cava. Bilateral chest tubes are seen. Catheter is seen in the mediastinum. There are no signs of pulmonary edema. There is improvement in aeration of left parahilar region and both lower lung fields, possibly suggesting resolution of interstitial edema or interstitial pneumonia. Small transverse linear densities seen in the left mid lung fields suggesting subsegmental atelectasis. There is minimal blunting of left lateral CP angle. There is possible tiny right apical pneumothorax. IMPRESSION: There is improvement in aeration of left parahilar region and both lower lung fields suggesting resolving interstitial pulmonary edema. There are no new focal pulmonary infiltrates. Minimal left pleural effusion and minimal right apical pneumothorax. Tip of endotracheal tube is 8.7  cm above the carina. Tip and side port in the enteric tube are noted in the course of the lower thoracic esophagus. Electronically Signed   By: Elmer Picker M.D.   On: 11/14/2021 08:11   DG CHEST PORT 1 VIEW  Result Date: 11/13/2021 CLINICAL DATA:  Chest tube reposition. EXAM: PORTABLE CHEST 1 VIEW COMPARISON:  11/13/2021 prior studies FINDINGS: Cardiomegaly, CABG changes, endotracheal tube with tip 5.3 cm above the carina, NG tube with side hole overlying the distal esophagus, RIGHT IJ central venous catheter, intra-aortic balloon pump and mediastinal/thoracostomy tubes are again noted and not significantly changed except for the RIGHT thoracostomy tube which has been somewhat retracted since the prior study. There may be a miniscule RIGHT apical pneumothorax present. Bilateral LOWER lung atelectasis is again noted. IMPRESSION: Slight retraction of RIGHT thoracostomy tube with other support apparatus unchanged. Question miniscule RIGHT apical pneumothorax. Electronically Signed   By: Margarette Canada M.D.   On: 11/13/2021 10:05   DG CHEST PORT 1 VIEW  Result Date: 11/13/2021 CLINICAL DATA:  Chest tube insertion in a 70 year old male. EXAM: PORTABLE CHEST 1 VIEW COMPARISON:  November 13, 2021. FINDINGS: RIGHT-sided IJ vascular sheath transmits a Swan-Ganz catheter, tip in the proximal to mid superior vena cava. Endotracheal tube with tip between clavicular heads approximately 6 cm from the carina. Gastric tube tip in the proximal stomach, side port likely in distal esophagus, retracted slightly since previous imaging. Mediastinal chest support tube and LEFT chest tube remain in place. Interval insertion of a RIGHT-sided chest tube with persistent moderate size RIGHT pneumothorax despite insertion of chest tube. Suspect volume loss along the medial RIGHT lung base. This contour along the RIGHT heart border shows no interval change. Improved appearance of interstitial and airspace opacities in the LEFT  chest when compared to previous imaging. No LEFT-sided pneumothorax. Cardiomediastinal contours are stable again with some mildly lobular added density along the RIGHT heart border. Changes of median sternotomy for CABG as before. On limited assessment no acute skeletal process. IMPRESSION: Still with moderate-size RIGHT pneumothorax despite placement of RIGHT-sided chest tube. Improved appearance of the LEFT chest may reflect resolving edema. Attention on follow-up. Mildly lobular added density along the RIGHT heart border may be related to postoperative change or volume loss in the RIGHT lower lobe but shows no change in terms of mediastinal contour, attention on follow-up. Slight interval retraction of the patient's gastric tube, side port likely in the distal aspect of the esophagus. These results will be called to the ordering clinician or representative by the Radiologist Assistant, and communication documented in the PACS or [employer]. Electronically Signed   By: Zetta Bills M.D.   On: 11/13/2021 09:44   DG Chest Port 1 View  Result Date: 11/13/2021 CLINICAL DATA:  Status post intubation. EXAM: PORTABLE CHEST 1 VIEW COMPARISON:  Chest radiograph dated 11/11/2021 and CT dated 11/11/2021 FINDINGS: Endotracheal tube with tip approximately 4.5 cm above the carina. Right IJ central venous line with tip over upper SVC. Enteric tube extends below the diaphragm with tip beyond the inferior margin of the image. Inferiorly accessed mediastinal and left chest drain. There is diffuse interstitial density throughout the left lung. There is a moderate size right pneumothorax measuring approximately 2.5 cm to the lateral pleural surface. There is mild enlargement of the cardiomediastinal silhouette. No acute osseous pathology. IMPRESSION: 1. Endotracheal tube above the carina. 2. Moderate size right pneumothorax. 3. Diffuse interstitial density throughout the left lung concerning for pneumonia. Follow-up  recommended. These results were called by telephone at the time of interpretation on 11/13/2021 at 1:25 am to Nurse Luna Glasgow, who verbally acknowledged these results. Electronically Signed   By: Anner Crete M.D.   On: 11/13/2021 01:36   ECHOCARDIOGRAM COMPLETE  Result Date: 11/12/2021    ECHOCARDIOGRAM REPORT   Patient Name:   TADEN STILE Date of Exam: 11/12/2021 Medical Rec #:  [id number]             Height:       72.0 in Accession #:    [id number]            Weight:       198.0 lb Date of Birth:  1951-08-25             BSA:          2.121 m Patient Age:    68 years              BP:           158/90 mmHg Patient  Gender: M                     HR:           54 bpm. Exam Location:  Inpatient Procedure: 2D Echo STAT ECHO Indications:    aortic dissection  History:        Patient has prior history of Echocardiogram examinations, most                 recent 11/12/2021. Signs/Symptoms:Chest Pain; Risk                 Factors:Dyslipidemia and Hypertension.  Sonographer:    Johny Chess RDCS Referring Phys: Marissa  1. Left ventricular ejection fraction, by estimation, is 55 to 60%. The left ventricle has normal function. The left ventricle demonstrates regional wall motion abnormalities (see scoring diagram/findings for description). There is mild left ventricular  hypertrophy. Left ventricular diastolic parameters are indeterminate.  2. Right ventricular systolic function is normal. The right ventricular size is normal. Tricuspid regurgitation signal is inadequate for assessing PA pressure.  3. There is a trivial pericardial effusion posterior to the left ventricle and anterior to the right ventricle.  4. The mitral valve is grossly normal. Trivial mitral valve regurgitation.  5. The aortic valve is tricuspid. Aortic valve regurgitation is not visualized. Aortic valve sclerosis is present, with no evidence of aortic valve stenosis.  6. The inferior vena cava is normal in  size with greater than 50% respiratory variability, suggesting right atrial pressure of 3 mmHg. Comparison(s): Prior images reviewed side by side. LVEF 55-60% with small area of apical anteroseptal hypokinesis that looks to have present on the prior study. Also trivial anterior and posterior pericardial effusion. FINDINGS  Left Ventricle: Left ventricular ejection fraction, by estimation, is 55 to 60%. The left ventricle has normal function. The left ventricle demonstrates regional wall motion abnormalities. The left ventricular internal cavity size was normal in size. There is mild left ventricular hypertrophy. Left ventricular diastolic parameters are indeterminate.  LV Wall Scoring: The apical septal segment is hypokinetic. The entire anterior wall, entire lateral wall, anterior septum, entire inferior wall, mid inferoseptal segment, basal inferoseptal segment, and apex are normal. Right Ventricle: The right ventricular size is normal. No increase in right ventricular wall thickness. Right ventricular systolic function is normal. Tricuspid regurgitation signal is inadequate for assessing PA pressure. Left Atrium: Left atrial size was normal in size. Right Atrium: Right atrial size was normal in size. Pericardium: Trivial pericardial effusion is present. The pericardial effusion is posterior to the left ventricle and anterior to the right ventricle. Mitral Valve: The mitral valve is grossly normal. Trivial mitral valve regurgitation. Tricuspid Valve: The tricuspid valve is grossly normal. Tricuspid valve regurgitation is trivial. Aortic Valve: The aortic valve is tricuspid. There is mild aortic valve annular calcification. Aortic valve regurgitation is not visualized. Aortic valve sclerosis is present, with no evidence of aortic valve stenosis. Pulmonic Valve: The pulmonic valve was grossly normal. Pulmonic valve regurgitation is not visualized. Aorta: The aortic root is normal in size and structure. Venous: The  inferior vena cava is normal in size with greater than 50% respiratory variability, suggesting right atrial pressure of 3 mmHg. IAS/Shunts: No atrial level shunt detected by color flow Doppler.  LEFT VENTRICLE PLAX 2D LVIDd:         3.50 cm     Diastology LVIDs:         2.20 cm  LV e' medial:    6.31 cm/s LV PW:         1.20 cm     LV E/e' medial:  11.8 LV IVS:        1.00 cm     LV e' lateral:   6.74 cm/s LVOT diam:     2.00 cm     LV E/e' lateral: 11.0 LV SV:         57 LV SV Index:   27 LVOT Area:     3.14 cm  LV Volumes (MOD) LV vol d, MOD A4C: 57.4 ml LV vol s, MOD A4C: 31.4 ml LV SV MOD A4C:     57.4 ml RIGHT VENTRICLE             IVC RV S prime:     11.20 cm/s  IVC diam: 1.60 cm TAPSE (M-mode): 2.2 cm LEFT ATRIUM             Index        RIGHT ATRIUM           Index LA diam:        3.00 cm 1.41 cm/m   RA Area:     13.40 cm LA Vol (A2C):   36.2 ml 17.06 ml/m  RA Volume:   35.80 ml  16.88 ml/m LA Vol (A4C):   26.0 ml 12.26 ml/m LA Biplane Vol: 30.9 ml 14.57 ml/m  AORTIC VALVE LVOT Vmax:   79.20 cm/s LVOT Vmean:  50.700 cm/s LVOT VTI:    0.183 m  AORTA Ao Root diam: 3.00 cm Ao Asc diam:  2.80 cm MITRAL VALVE MV Area (PHT): 3.60 cm    SHUNTS MV Decel Time: 211 msec    Systemic VTI:  0.18 m MV E velocity: 74.40 cm/s  Systemic Diam: 2.00 cm MV A velocity: 71.00 cm/s MV E/A ratio:  1.05 Rozann Lesches MD Electronically signed by Rozann Lesches MD Signature Date/Time: 11/12/2021/7:08:03 PM    Final    ECHOCARDIOGRAM COMPLETE  Result Date: 11/12/2021    ECHOCARDIOGRAM REPORT   Patient Name:   TRADARIUS MOBILIA Date of Exam: 11/12/2021 Medical Rec #:  [id number]             Height:       72.0 in Accession #:    [id number]            Weight:       198.0 lb Date of Birth:  [birth year]             BSA:          2.121 m Patient Age:    84 years              BP:           168/97 mmHg Patient Gender: M                     HR:           67 bpm. Exam Location:  [facility] Procedure: 2D Echo, Cardiac Doppler  and Color Doppler Indications:    Chest Pain  History:        Patient has no prior history of Echocardiogram examinations.                 Signs/Symptoms:Chest Pain and Syncope; Risk Factors:Hypertension                 and Dyslipidemia.  Sonographer:  Mikki Harbor Referring Phys: [id number] [name] IMPRESSIONS  1. Left ventricular ejection fraction, by estimation, is 60 to 65%. The left ventricle has normal function. The left ventricle has no regional wall motion abnormalities. There is moderate left ventricular hypertrophy. Left ventricular diastolic parameters are consistent with Grade I diastolic dysfunction (impaired relaxation).  2. Right ventricular systolic function is normal. The right ventricular size is normal. There is normal pulmonary artery systolic pressure.  3. The mitral valve is normal in structure. No evidence of mitral valve regurgitation. No evidence of mitral stenosis.  4. The aortic valve is tricuspid. Aortic valve regurgitation is not visualized. No aortic stenosis is present.  5. The inferior vena cava is normal in size with greater than 50% respiratory variability, suggesting right atrial pressure of 3 mmHg. FINDINGS  Left Ventricle: Left ventricular ejection fraction, by estimation, is 60 to 65%. The left ventricle has normal function. The left ventricle has no regional wall motion abnormalities. The left ventricular internal cavity size was normal in size. There is  moderate left ventricular hypertrophy. Left ventricular diastolic parameters are consistent with Grade I diastolic dysfunction (impaired relaxation). Normal left ventricular filling pressure. Right Ventricle: The right ventricular size is normal. No increase in right ventricular wall thickness. Right ventricular systolic function is normal. There is normal pulmonary artery systolic pressure. The tricuspid regurgitant velocity is 2.17 m/s, and  with an assumed right atrial pressure of 3 mmHg, the estimated right  ventricular systolic pressure is 21.8 mmHg. Left Atrium: Left atrial size was normal in size. Right Atrium: Right atrial size was normal in size. Pericardium: There is no evidence of pericardial effusion. Mitral Valve: The mitral valve is normal in structure. No evidence of mitral valve regurgitation. No evidence of mitral valve stenosis. MV peak gradient, 2.8 mmHg. The mean mitral valve gradient is 1.0 mmHg. Tricuspid Valve: The tricuspid valve is normal in structure. Tricuspid valve regurgitation is mild . No evidence of tricuspid stenosis. Aortic Valve: The aortic valve is tricuspid. Aortic valve regurgitation is not visualized. No aortic stenosis is present. Aortic valve mean gradient measures 5.0 mmHg. Aortic valve peak gradient measures 8.8 mmHg. Aortic valve area, by VTI measures 2.20 cm. Pulmonic Valve: The pulmonic valve was not well visualized. Pulmonic valve regurgitation is not visualized. No evidence of pulmonic stenosis. Aorta: The aortic root is normal in size and structure. Venous: The inferior vena cava is normal in size with greater than 50% respiratory variability, suggesting right atrial pressure of 3 mmHg. IAS/Shunts: No atrial level shunt detected by color flow Doppler.  LEFT VENTRICLE PLAX 2D LVIDd:         3.90 cm     Diastology LVIDs:         2.20 cm     LV e' medial:    5.66 cm/s LV PW:         1.40 cm     LV E/e' medial:  13.4 LV IVS:        1.50 cm     LV e' lateral:   7.72 cm/s LVOT diam:     2.00 cm     LV E/e' lateral: 9.8 LV SV:         71 LV SV Index:   33 LVOT Area:     3.14 cm  LV Volumes (MOD) LV vol d, MOD A2C: 59.9 ml LV vol d, MOD A4C: 59.2 ml LV vol s, MOD A2C: 28.4 ml LV vol s, MOD A4C: 24.1 ml LV SV  MOD A2C:     31.5 ml LV SV MOD A4C:     59.2 ml LV SV MOD BP:      33.2 ml RIGHT VENTRICLE RV Basal diam:  3.70 cm RV Mid diam:    3.40 cm RV S prime:     11.30 cm/s TAPSE (M-mode): 2.5 cm LEFT ATRIUM             Index        RIGHT ATRIUM           Index LA diam:        3.30 cm  1.56 cm/m   RA Area:     16.50 cm LA Vol (A2C):   35.8 ml 16.88 ml/m  RA Volume:   48.00 ml  22.63 ml/m LA Vol (A4C):   37.9 ml 17.86 ml/m LA Biplane Vol: 36.6 ml 17.25 ml/m  AORTIC VALVE                     PULMONIC VALVE AV Area (Vmax):    1.97 cm      PV Vmax:       0.71 m/s AV Area (Vmean):   2.07 cm      PV Peak grad:  2.0 mmHg AV Area (VTI):     2.20 cm AV Vmax:           148.00 cm/s AV Vmean:          106.000 cm/s AV VTI:            0.322 m AV Peak Grad:      8.8 mmHg AV Mean Grad:      5.0 mmHg LVOT Vmax:         92.70 cm/s LVOT Vmean:        69.700 cm/s LVOT VTI:          0.225 m LVOT/AV VTI ratio: 0.70  AORTA Ao Root diam: 3.20 cm MITRAL VALVE               TRICUSPID VALVE MV Area (PHT): 4.29 cm    TR Peak grad:   18.8 mmHg MV Area VTI:   3.55 cm    TR Vmax:        217.00 cm/s MV Peak grad:  2.8 mmHg MV Mean grad:  1.0 mmHg    SHUNTS MV Vmax:       0.84 m/s    Systemic VTI:  0.22 m MV Vmean:      51.8 cm/s   Systemic Diam: 2.00 cm MV Decel Time: 177 msec MV E velocity: 75.80 cm/s MV A velocity: 87.40 cm/s MV E/A ratio:  0.87 Carlyle Dolly MD Electronically signed by Carlyle Dolly MD Signature Date/Time: 11/12/2021/10:25:53 AM    Final    ECHO INTRAOPERATIVE TEE  Result Date: 11/14/2021  *INTRAOPERATIVE TRANSESOPHAGEAL REPORT *  Patient Name:   ADAM RENTSCHLER Date of Exam: 11/12/2021 Medical Rec #:  [id number]             Height:       72.0 in Accession #:    [id number]            Weight:       198.0 lb Date of Birth:  Oct 24, 1951             BSA:          2.12 m Patient Age:    64 years  BP:           131/96 mmHg Patient Gender: M                     HR:           67 bpm. Exam Location:  Anesthesiology Transesophogeal exam was perform intraoperatively during surgical procedure. Patient was closely monitored under general anesthesia during the entirety of examination. Indications:     CAD, CABG Performing Phys: [id number] HARRELL O LIGHTFOOT Complications: No known  complications during this procedure. POST-OP IMPRESSIONS _ Left Ventricle: The left ventricle is unchanged from pre-bypass. _ Right Ventricle: The right ventricle appears unchanged from pre-bypass. _ Aorta: The aorta appears unchanged from pre-bypass. _ Left Atrial Appendage: The left atrial appendage appears unchanged from pre-bypass. _ Aortic Valve: The aortic valve appears unchanged from pre-bypass. _ Mitral Valve: There is mild regurgitation. _ Tricuspid Valve: The tricuspid valve appears unchanged from pre-bypass. _ Pulmonic Valve: The pulmonic valve appears unchanged from pre-bypass. _ Comments: No significant pericardial effusion. PRE-OP FINDINGS  Left Ventricle: The left ventricle has hyperdynamic systolic function, with an ejection fraction of >65%. The cavity size was decreased. There is severe concentric left ventricular hypertrophy. Left ventricular diastolic function could not be evaluated. Right Ventricle: The right ventricle has normal systolic function. The cavity was normal. There is no increase in right ventricular wall thickness. Left Atrium: Left atrial size was normal in size. No left atrial/left atrial appendage thrombus was detected. The left atrial appendage is well visualized and there is no evidence of thrombus present. Right Atrium: Right atrial size was normal in size. Interatrial Septum: No atrial level shunt detected by color flow Doppler. There is no evidence of a patent foramen ovale. Pericardium: A small pericardial effusion is present. The pericardial effusion is circumferential. Mitral Valve: The mitral valve is normal in structure. Mitral valve regurgitation is trivial by color flow Doppler. Tricuspid Valve: The tricuspid valve was normal in structure. Tricuspid valve regurgitation is mild by color flow Doppler. Aortic Valve: The aortic valve is normal in structure. Aortic valve regurgitation was not visualized by color flow Doppler. There is no stenosis of the aortic valve. There  is no evidence of aortic valve vegetation. Pulmonic Valve: The pulmonic valve was normal in structure. Pulmonic valve regurgitation is trivial by color flow Doppler. Aorta: The ascending aorta and aortic root are normal in size and structure. There is evidence of plaque in the descending aorta; Grade III, measuring 3-88mm in size. Shunts: There is no evidence of an atrial septal defect.  Nolon Nations MD Electronically signed by Nolon Nations MD Signature Date/Time: 11/14/2021/8:14:05 AM    Final      Treatments:  Surgery: 11/13/2021 Patient:  Lu Duffel Pre-Op Dx: NSTEMI                         Left main dissection                             3V CAD    Post-op Dx:  same Procedure: Emergency CABG X 2.  LIMA LAD, RSVG RI   Open greater saphenous vein harvest on the left     Surgeon and Role:      * Lightfoot, Lucile Crater, MD - [place], PA-C - assisting An experienced assistant was required given the complexity of this surgery and the  standard of surgical care. The assistant was needed for exposure, dissection, suctioning, retraction of delicate tissues and sutures, instrument exchange and for overall help during this procedure.     Anesthesia  general EBL:  549ml Blood Administration: 1pPlts Xclamp Time:  38 min Pump Time:  42min   Drains: 4 F blake drain: L, mediastinal  Wires: Ventricular Counts: correct     Indications: 70 year old gentleman that transferred from [facility] with an NSTEMI.  He went to the Cath Lab today for PCI, but during intervention to the circumflex vessel left main coronary artery dissection was noted.  CTS was consulted.  He had decreased flow going down his LAD, and had chest pain and the recovery area.  He was taken emergently to the OR for revascularization of his LAD and ramus. Findings: Small LIMA.  Good vein.  There was a hemopericardium that was noted upon entry.  There was bruising and mottling along the aorta  along the AP window which tracked posterior along the aorta.  We confirmed that there was no dissection of the aorta with Intra-Op TEE.  There was no obvious bleeding from the AP window.  The ramus intermedius was a very large target.  There were no good targets on the lateral wall.  The LAD was intramyocardial but it was fairly decent target.     Discharge Exam: Blood pressure (!) 141/76, pulse 99, temperature 98.3 F (36.8 C), temperature source Oral, resp. rate (!) 21, height 6' (1.829 m), weight 85.3 kg, SpO2 99 %.  General appearance: alert, cooperative, and no distress Heart: regular rate and rhythm Lungs: mildly dim in bases Abdomen: benign Extremities: no edema Wound: incis healing well    Discharge Medications:  The patient has been discharged on:   1.Beta Blocker:  Yes [  y ]                              No   [   ]                              If No, reason:  2.Ace Inhibitor/ARB: Yes [   ]                                     No  [  n  ]                                     If No, reason:post op AKI  3.Statin:   Yes [  y ]                  No  [   ]                  If No, reason:  4.Ecasa:  Yes  [  y ]                  No   [   ]                  If No, reason:  Patient had ACS upon admission:  Plavix/P2Y12 inhibitor: Yes [   y]  No  [   ]     Discharge Instructions     Amb Referral to Cardiac Rehabilitation   Complete by: As directed    Diagnosis:  CABG NSTEMI     CABG X ___: 2   After initial evaluation and assessments completed: Virtual Based Care may be provided alone or in conjunction with Phase 2 Cardiac Rehab based on patient barriers.: Yes   Discharge patient   Complete by: As directed    Discharge disposition: 01-Home or Self Care   Discharge patient date: 11/22/2021      Allergies as of 11/22/2021   No Known Allergies      Medication List     STOP taking these medications    Cartia XT 300 MG 24  hr capsule Generic drug: diltiazem   chlorhexidine 0.12 % solution Commonly known as: PERIDEX   lisinopril-hydrochlorothiazide 20-12.5 MG tablet Commonly known as: ZESTORETIC       TAKE these medications    acetaminophen 325 MG tablet Commonly known as: TYLENOL Take 2 tablets (650 mg total) by mouth every 6 (six) hours as needed for mild pain or fever.   aspirin EC 81 MG tablet Take 81 mg by mouth daily. Swallow whole.   clopidogrel 75 MG tablet Commonly known as: PLAVIX Take 1 tablet (75 mg total) by mouth daily.   ferrous [id number] C-folic acid capsule Commonly known as: TRINSICON / FOLTRIN Take 1 capsule by mouth 2 (two) times daily after a meal.   Metoprolol Tartrate 37.5 MG Tabs Take 37.5 mg by mouth 3 (three) times daily.   rosuvastatin 40 MG tablet Commonly known as: CRESTOR Take 1 tablet (40 mg total) by mouth daily at 8 pm. What changed:  medication strength how much to take when to take this   traMADol 50 MG tablet Commonly known as: ULTRAM Take 1 tablet (50 mg total) by mouth every 6 (six) hours as needed for up to 7 days for moderate pain.        Follow-up Information     Lightfoot, Lucile Crater, MD Follow up.   Specialty: Cardiothoracic Surgery Why: Please see discharge paperwork for follow-up with Dr. Kipp Brood.  First appointment will be remote so do not come to the office.  Subsequent appointments will be in person. Contact information: [place] [address] [phone]         [facility] Follow up.   Specialty: Cardiology Why: A cardiology follow-up visit has been arranged for you on Friday Nov 28, 2021 at 2:20 PM (Arrive by 2:05 PM) with one of our nurse practitioners, Laurann Montana. Please note this is at the [place] location located off [address] in [place]. Contact information: [address] [place]  [phone] [facility] Follow up.   Why: home 02, rollator, 3n1 arranged- to be delivered to room prior to discharge- Contact information: [place] [address] [phone]         [facility] Follow up.   Specialty: [place] Why: HHPT- arranged- they will contact you to schedule initial visit Contact information: [address] [place] [address] [phone]                 Signed: John Giovanni, PA-C  11/23/2021, 10:04 AM

## 2021-11-14 NOTE — Progress Notes (Signed)
Progress Note  Patient Name: Dean Mendez Date of Encounter: 11/14/2021  [facility] Cardiologist: None   Subjective   Sleepy.  Not able to assess.   Inpatient Medications    Scheduled Meds:  sodium chloride   Intravenous Once   sodium chloride   Intravenous Once   acetaminophen  1,000 mg Oral Q6H   Or   acetaminophen (TYLENOL) oral liquid 160 mg/5 mL  1,000 mg Per Tube Q6H   atorvastatin  40 mg Oral Daily   bisacodyl  10 mg Oral Daily   Or   bisacodyl  10 mg Rectal Daily   chlorhexidine gluconate (MEDLINE KIT)  15 mL Mouth Rinse BID   Chlorhexidine Gluconate Cloth  6 each Topical Daily   docusate sodium  200 mg Oral Daily   insulin aspart  0-15 Units Subcutaneous Q4H   mouth rinse  15 mL Mouth Rinse 10 times per day   pantoprazole  40 mg Oral Daily   sodium chloride flush  3 mL Intravenous Q12H   sodium chloride flush  3 mL Intravenous Q12H   sodium chloride flush  3 mL Intravenous Q12H   Continuous Infusions:  sodium chloride 10 mL/hr at 11/14/21 0900   sodium chloride 250 mL (11/13/21 1447)   sodium chloride Stopped (11/13/21 1446)   sodium chloride     sodium chloride 1 mL/kg/hr (11/12/21 1103)    ceFAZolin (ANCEF) IV Stopped (11/14/21 0533)   dexmedetomidine (PRECEDEX) IV infusion 0.2 mcg/kg/hr (11/14/21 0900)   lactated ringers 10 mL/hr at 11/14/21 0900   lactated ringers     nitroGLYCERIN Stopped (11/13/21 0050)   norepinephrine (LEVOPHED) Adult infusion Stopped (11/14/21 0836)   phenylephrine (NEO-SYNEPHRINE) Adult infusion 2 mcg/min (11/14/21 0900)   PRN Meds: sodium chloride, sodium chloride, acetaminophen, metoprolol tartrate, midazolam, morphine injection, ondansetron (ZOFRAN) IV, oxyCODONE, sodium chloride flush, sodium chloride flush, traMADol   Vital Signs    Vitals:   11/14/21 0830 11/14/21 0845 11/14/21 0900 11/14/21 0915  BP:   (!) 87/64   Pulse:  76  82  Resp: '11 14 11 ' (!) 8  Temp: 99.5 F (37.5 C) 99.5 F (37.5 C) 99.5 F  (37.5 C) 99.7 F (37.6 C)  TempSrc:      SpO2:  100%  100%  Weight:      Height:        Intake/Output Summary (Last 24 hours) at 11/14/2021 1008 Last data filed at 11/14/2021 0900 Gross per 24 hour  Intake 2791.94 ml  Output 1855 ml  Net 936.94 ml   Last 3 Weights 11/14/2021 11/11/2021 10/26/2019  Weight (lbs) 192 lb 3.9 oz 198 lb 195 lb  Weight (kg) 87.2 kg 89.812 kg 88.451 kg      Telemetry    Sinus rhythm.  No events.  - Personally Reviewed  ECG    Sinus rhythm.  PVCs.  Rate 97 bpm. - Personally Reviewed  Physical Exam   VS:  BP (!) 145/104   Pulse 95   Temp 99.3 F (37.4 C)   Resp 12   Ht 6' (1.829 m)   Wt 87.2 kg   SpO2 97%   BMI 26.07 kg/m  , BMI Body mass index is 26.07 kg/m. GENERAL: Sleepy.  Not awakening to answer questions.  HEENT: Pupils equal round and reactive, fundi not visualized, oral mucosa unremarkable NECK:  No jugular venous distention, waveform within normal limits, carotid upstroke brisk and symmetric, no bruits, no thyromegaly LUNGS:  Clear to auscultation bilaterally.  Chest  tubes in place.  HEART:  RRR.  PMI not displaced or sustained,S1 and S2 within normal limits, no S3, no S4, no clicks, no rubs, no murmurs ABD:  Flat, positive bowel sounds normal in frequency in pitch, no bruits, no rebound, no guarding, no midline pulsatile mass, no hepatomegaly, no splenomegaly EXT:  No edema, no cyanosis no clubbing SKIN:  No rashes no nodules NEURO:  Sleepy.  Unable to assess.  PSYCH:  Sleepy.  Unable to assess.    Labs    High Sensitivity Troponin:   Recent Labs  Lab 11/11/21 1308 11/11/21 1541 11/12/21 0149 11/12/21 0602  TROPONINIHS 13 77* 3,676* 4,175*     Chemistry Recent Labs  Lab 11/12/21 0149 11/12/21 2112 11/13/21 0510 11/13/21 0511 11/13/21 1610 11/13/21 1919 11/14/21 0000 11/14/21 0445 11/14/21 0633  NA 136   < > 139   < > 139   < > 139 142 144  K 3.0*   < > 4.6   < > 4.4   < > 4.1 4.1 3.9  CL 104   < > 110  --   110  --  112* 115*  --   CO2 24  --  23  --  22  --  21* 23  --   GLUCOSE 127*   < > 108*  --  168*  --  155* 137*  --   BUN 13   < > 11  --  16  --  17 16  --   CREATININE 1.04   < > 1.20  --  1.52*  --  1.49* 1.39*  --   CALCIUM 9.2  --  7.5*  --  7.5*  --  7.7* 7.9*  --   MG 2.1  --  2.8*  --  3.1*  --   --  2.6*  --   PROT 6.5  --   --   --   --   --   --   --   --   ALBUMIN 3.7  --   --   --   --   --   --   --   --   AST 34  --   --   --   --   --   --   --   --   ALT 17  --   --   --   --   --   --   --   --   ALKPHOS 76  --   --   --   --   --   --   --   --   BILITOT 0.6  --   --   --   --   --   --   --   --   GFRNONAA >60  --  >60  --  50*  --  51* 55*  --   ANIONGAP 8  --  6  --  7  --  6 4*  --    < > = values in this interval not displayed.    Lipids No results for input(s): CHOL, TRIG, HDL, LABVLDL, LDLCALC, CHOLHDL in the last 168 hours.  Hematology Recent Labs  Lab 11/13/21 1610 11/13/21 1919 11/14/21 0000 11/14/21 0445 11/14/21 0633  WBC 10.3  --  10.7* 12.5*  --   RBC 3.27*  --  3.17* 3.09*  --   HGB 9.6*   < > 9.3* 8.9* 8.2*  HCT 28.0*   < >  26.9* 26.5* 24.0*  MCV 85.6  --  84.9 85.8  --   MCH 29.4  --  29.3 28.8  --   MCHC 34.3  --  34.6 33.6  --   RDW 13.8  --  14.0 14.1  --   PLT 154  --  128* 122*  --    < > = values in this interval not displayed.   Thyroid No results for input(s): TSH, FREET4 in the last 168 hours.  BNPNo results for input(s): BNP, PROBNP in the last 168 hours.  DDimer No results for input(s): DDIMER in the last 168 hours.   Radiology    CARDIAC CATHETERIZATION  Result Date: 11/12/2021   Successful balloon pump placement via RFA sheath.  Confirmed by fluoroscopy.Marland Kitchen   CARDIAC CATHETERIZATION  Result Date: 11/12/2021   Ost LM to Prox LAD lesion is 30% stenosed with 80% stenosed side branch in Ost Cx.  Post intervention, there is a 50% residual stenosis with distal LM dissection.  Post attempted intervention, the OM side branch  was reduced to 80% residual stenosis.   Ramus-1 lesion is 40% stenosed. - mid 90%, distal mid thrombotic 90%.   Mid Cx to Dist Cx lesion is 95% stenosed. - plan was to attempt PCI   multiple wires were tried - unable to cross lesion   Post intervention, there is a 99% residual stenosis. TIMI 1 flow noted.   Prox RCA to Mid RCA lesion is 75% stenosed. non-dominant vessel   There is mild to moderate left ventricular systolic dysfunction.  The left ventricular ejection fraction is 45-50% by visual estimate.   LV end diastolic pressure is normal.   There is no aortic valve stenosis. 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  The downstream vessel has pruning with what appears to be possible distal embolization. LCx is an unusual takeoff, there is diffuse moderate 70% disease followed by a hairpin turn where there is apparently an occluded OM 2 branch.  There is a 99% stenosis at this lesion that is heavily calcified. RCA appears to be nondominant heavily diseased small caliber vessel.  Original plan was to attempt PTCA/PCI of the LCx with Aggrastat infusion overnight and planned attempted PCI of the ramus intermedius on Friday. Attempted PTCA of the circumflex was performed using [place], Choice PT and whisper wires.  The vessel was very difficult to wire and was not able to reach beyond the lesion.  Imaging after wire removal revealed that the vessel was now no longer subtotally occluded but appeared to have total occlusion with TIMI I flow distally.  At this point I chose to abort for further procedures.  On continued to of post wiring angiography, there appears to be dye hang up in the left main into the aortic  root concerning for possible dissection.   Aggrastat bolus had been given but was discontinued.  CVTS consult called for potential emergent surgery.   EF appears to be 45 to 50%.  There appears to be apical inferior and mid to distal lateral-inferolateral hypokinesis. Normal LVEDP.  PATIENT DISPOSITION:  PACU - hemodynamically stable.  Stat echocardiogram called  CVTS consult call with Dr. Kipp Brood -> likely emergent surgery.  Would like to consider  CABG.  Antiplatelet agents have not stopped. PLAN OF CARE:  The patient has been already been admitted to 6 E., however, the plan after discussion with Dr. Kipp Brood is to place IABP & go to OR   DG CHEST PORT 1 VIEW  Result Date: 11/14/2021 CLINICAL DATA:  Status post cardiac surgery EXAM: PORTABLE CHEST 1 VIEW COMPARISON:  Previous studies including the examination of 11/13/2021 FINDINGS: Tip of endotracheal tube is 8.7 cm above the carina. Tip and side-port in the enteric tube are noted in the lower thoracic esophagus. Tip central venous catheter is seen in the superior vena cava. Bilateral chest tubes are seen. Catheter is seen in the mediastinum. There are no signs of pulmonary edema. There is improvement in aeration of left parahilar region and both lower lung fields, possibly suggesting resolution of interstitial edema or interstitial pneumonia. Small transverse linear densities seen in the left mid lung fields suggesting subsegmental atelectasis. There is minimal blunting of left lateral CP angle. There is possible tiny right apical pneumothorax. IMPRESSION: There is improvement in aeration of left parahilar region and both lower lung fields suggesting resolving interstitial pulmonary edema. There are no new focal pulmonary infiltrates. Minimal left pleural effusion and minimal right apical pneumothorax. Tip of endotracheal tube is 8.7 cm above the carina. Tip and side port in the enteric tube are noted in the course of the lower thoracic esophagus.  Electronically Signed   By: Elmer Picker M.D.   On: 11/14/2021 08:11   DG CHEST PORT 1 VIEW  Result Date: 11/13/2021 CLINICAL DATA:  Chest tube reposition. EXAM: PORTABLE CHEST 1 VIEW COMPARISON:  11/13/2021 prior studies FINDINGS: Cardiomegaly, CABG changes, endotracheal tube with tip 5.3 cm above the carina, NG tube with side hole overlying the distal esophagus, RIGHT IJ central venous catheter, intra-aortic balloon pump and mediastinal/thoracostomy tubes are again noted and not significantly changed except for the RIGHT thoracostomy tube which has been somewhat retracted since the prior study. There may be a miniscule RIGHT apical pneumothorax present. Bilateral LOWER lung atelectasis is again noted. IMPRESSION: Slight retraction of RIGHT thoracostomy tube with other support apparatus unchanged. Question miniscule RIGHT apical pneumothorax. Electronically Signed   By: Margarette Canada M.D.   On: 11/13/2021 10:05   DG CHEST PORT 1 VIEW  Result Date: 11/13/2021 CLINICAL DATA:  Chest tube insertion in a 70 year old male. EXAM: PORTABLE CHEST 1 VIEW COMPARISON:  November 13, 2021. FINDINGS: RIGHT-sided IJ vascular sheath transmits a Swan-Ganz catheter, tip in the proximal to mid superior vena cava. Endotracheal tube with tip between clavicular heads approximately 6 cm from the carina. Gastric tube tip in the proximal stomach, side port likely in distal esophagus, retracted slightly since previous imaging. Mediastinal chest support tube and LEFT chest tube remain in place. Interval insertion of a RIGHT-sided chest tube with persistent moderate size RIGHT pneumothorax despite insertion of chest tube. Suspect volume loss along the medial RIGHT lung base. This contour along the RIGHT heart border shows no interval change. Improved appearance of interstitial and airspace opacities in the LEFT chest when compared to previous imaging. No LEFT-sided pneumothorax. Cardiomediastinal contours are stable again with  some mildly lobular added density along the RIGHT heart border. Changes of median sternotomy for CABG as before. On limited assessment no acute skeletal process. IMPRESSION: Still with moderate-size RIGHT pneumothorax despite placement of RIGHT-sided chest tube. Improved appearance of the LEFT chest may reflect resolving edema. Attention on follow-up. Mildly lobular added density along the RIGHT heart border may be  related to postoperative change or volume loss in the RIGHT lower lobe but shows no change in terms of mediastinal contour, attention on follow-up. Slight interval retraction of the patient's gastric tube, side port likely in the distal aspect of the esophagus. These results will be called to the ordering clinician or representative by the Radiologist Assistant, and communication documented in the PACS or [employer]. Electronically Signed   By: Zetta Bills M.D.   On: 11/13/2021 09:44   DG Chest Port 1 View  Result Date: 11/13/2021 CLINICAL DATA:  Status post intubation. EXAM: PORTABLE CHEST 1 VIEW COMPARISON:  Chest radiograph dated 11/11/2021 and CT dated 11/11/2021 FINDINGS: Endotracheal tube with tip approximately 4.5 cm above the carina. Right IJ central venous line with tip over upper SVC. Enteric tube extends below the diaphragm with tip beyond the inferior margin of the image. Inferiorly accessed mediastinal and left chest drain. There is diffuse interstitial density throughout the left lung. There is a moderate size right pneumothorax measuring approximately 2.5 cm to the lateral pleural surface. There is mild enlargement of the cardiomediastinal silhouette. No acute osseous pathology. IMPRESSION: 1. Endotracheal tube above the carina. 2. Moderate size right pneumothorax. 3. Diffuse interstitial density throughout the left lung concerning for pneumonia. Follow-up recommended. These results were called by telephone at the time of interpretation on 11/13/2021 at 1:25 am to Nurse Luna Glasgow, who verbally acknowledged these results. Electronically Signed   By: Anner Crete M.D.   On: 11/13/2021 01:36   ECHOCARDIOGRAM COMPLETE  Result Date: 11/12/2021    ECHOCARDIOGRAM REPORT   Patient Name:   CHEIKH BRAMBLE Date of Exam: 11/12/2021 Medical Rec #:  [id number]             Height:       72.0 in Accession #:    [id number]            Weight:       198.0 lb Date of Birth:  1951-09-15             BSA:          2.121 m Patient Age:    70 years              BP:           158/90 mmHg Patient Gender: M                     HR:           54 bpm. Exam Location:  Inpatient Procedure: 2D Echo STAT ECHO Indications:    aortic dissection  History:        Patient has prior history of Echocardiogram examinations, most                 recent 11/12/2021. Signs/Symptoms:Chest Pain; Risk                 Factors:Dyslipidemia and Hypertension.  Sonographer:    Johny Chess RDCS Referring Phys: [place]  1. Left ventricular ejection fraction, by estimation, is 55 to 60%. The left ventricle has normal function. The left ventricle demonstrates regional wall motion abnormalities (see scoring diagram/findings for description). There is mild left ventricular  hypertrophy. Left ventricular diastolic parameters are indeterminate.  2. Right ventricular systolic function is normal. The right ventricular size is normal. Tricuspid regurgitation signal is inadequate for assessing PA pressure.  3. There is a trivial pericardial effusion posterior to the left ventricle  and anterior to the right ventricle.  4. The mitral valve is grossly normal. Trivial mitral valve regurgitation.  5. The aortic valve is tricuspid. Aortic valve regurgitation is not visualized. Aortic valve sclerosis is present, with no evidence of aortic valve stenosis.  6. The inferior vena cava is normal in size with greater than 50% respiratory variability, suggesting right atrial pressure of 3 mmHg. Comparison(s): Prior  images reviewed side by side. LVEF 55-60% with small area of apical anteroseptal hypokinesis that looks to have present on the prior study. Also trivial anterior and posterior pericardial effusion. FINDINGS  Left Ventricle: Left ventricular ejection fraction, by estimation, is 55 to 60%. The left ventricle has normal function. The left ventricle demonstrates regional wall motion abnormalities. The left ventricular internal cavity size was normal in size. There is mild left ventricular hypertrophy. Left ventricular diastolic parameters are indeterminate.  LV Wall Scoring: The apical septal segment is hypokinetic. The entire anterior wall, entire lateral wall, anterior septum, entire inferior wall, mid inferoseptal segment, basal inferoseptal segment, and apex are normal. Right Ventricle: The right ventricular size is normal. No increase in right ventricular wall thickness. Right ventricular systolic function is normal. Tricuspid regurgitation signal is inadequate for assessing PA pressure. Left Atrium: Left atrial size was normal in size. Right Atrium: Right atrial size was normal in size. Pericardium: Trivial pericardial effusion is present. The pericardial effusion is posterior to the left ventricle and anterior to the right ventricle. Mitral Valve: The mitral valve is grossly normal. Trivial mitral valve regurgitation. Tricuspid Valve: The tricuspid valve is grossly normal. Tricuspid valve regurgitation is trivial. Aortic Valve: The aortic valve is tricuspid. There is mild aortic valve annular calcification. Aortic valve regurgitation is not visualized. Aortic valve sclerosis is present, with no evidence of aortic valve stenosis. Pulmonic Valve: The pulmonic valve was grossly normal. Pulmonic valve regurgitation is not visualized. Aorta: The aortic root is normal in size and structure. Venous: The inferior vena cava is normal in size with greater than 50% respiratory variability, suggesting right atrial pressure  of 3 mmHg. IAS/Shunts: No atrial level shunt detected by color flow Doppler.  LEFT VENTRICLE PLAX 2D LVIDd:         3.50 cm     Diastology LVIDs:         2.20 cm     LV e' medial:    6.31 cm/s LV PW:         1.20 cm     LV E/e' medial:  11.8 LV IVS:        1.00 cm     LV e' lateral:   6.74 cm/s LVOT diam:     2.00 cm     LV E/e' lateral: 11.0 LV SV:         57 LV SV Index:   27 LVOT Area:     3.14 cm  LV Volumes (MOD) LV vol d, MOD A4C: 57.4 ml LV vol s, MOD A4C: 31.4 ml LV SV MOD A4C:     57.4 ml RIGHT VENTRICLE             IVC RV S prime:     11.20 cm/s  IVC diam: 1.60 cm TAPSE (M-mode): 2.2 cm LEFT ATRIUM             Index        RIGHT ATRIUM           Index LA diam:        3.00 cm 1.41  cm/m   RA Area:     13.40 cm LA Vol (A2C):   36.2 ml 17.06 ml/m  RA Volume:   35.80 ml  16.88 ml/m LA Vol (A4C):   26.0 ml 12.26 ml/m LA Biplane Vol: 30.9 ml 14.57 ml/m  AORTIC VALVE LVOT Vmax:   79.20 cm/s LVOT Vmean:  50.700 cm/s LVOT VTI:    0.183 m  AORTA Ao Root diam: 3.00 cm Ao Asc diam:  2.80 cm MITRAL VALVE MV Area (PHT): 3.60 cm    SHUNTS MV Decel Time: 211 msec    Systemic VTI:  0.18 m MV E velocity: 74.40 cm/s  Systemic Diam: 2.00 cm MV A velocity: 71.00 cm/s MV E/A ratio:  1.05 Rozann Lesches MD Electronically signed by Rozann Lesches MD Signature Date/Time: 11/12/2021/7:08:03 PM    Final    ECHO INTRAOPERATIVE TEE  Result Date: 11/14/2021  *INTRAOPERATIVE TRANSESOPHAGEAL REPORT *  Patient Name:   IZAC FAULKENBERRY Date of Exam: 11/12/2021 Medical Rec #:  [id number]             Height:       72.0 in Accession #:    [id number]            Weight:       198.0 lb Date of Birth:  09-19-1951             BSA:          2.12 m Patient Age:    41 years              BP:           131/96 mmHg Patient Gender: M                     HR:           67 bpm. Exam Location:  Anesthesiology Transesophogeal exam was perform intraoperatively during surgical procedure. Patient was closely monitored under general anesthesia  during the entirety of examination. Indications:     CAD, CABG Performing Phys: [id number] HARRELL O LIGHTFOOT Complications: No known complications during this procedure. POST-OP IMPRESSIONS _ Left Ventricle: The left ventricle is unchanged from pre-bypass. _ Right Ventricle: The right ventricle appears unchanged from pre-bypass. _ Aorta: The aorta appears unchanged from pre-bypass. _ Left Atrial Appendage: The left atrial appendage appears unchanged from pre-bypass. _ Aortic Valve: The aortic valve appears unchanged from pre-bypass. _ Mitral Valve: There is mild regurgitation. _ Tricuspid Valve: The tricuspid valve appears unchanged from pre-bypass. _ Pulmonic Valve: The pulmonic valve appears unchanged from pre-bypass. _ Comments: No significant pericardial effusion. PRE-OP FINDINGS  Left Ventricle: The left ventricle has hyperdynamic systolic function, with an ejection fraction of >65%. The cavity size was decreased. There is severe concentric left ventricular hypertrophy. Left ventricular diastolic function could not be evaluated. Right Ventricle: The right ventricle has normal systolic function. The cavity was normal. There is no increase in right ventricular wall thickness. Left Atrium: Left atrial size was normal in size. No left atrial/left atrial appendage thrombus was detected. The left atrial appendage is well visualized and there is no evidence of thrombus present. Right Atrium: Right atrial size was normal in size. Interatrial Septum: No atrial level shunt detected by color flow Doppler. There is no evidence of a patent foramen ovale. Pericardium: A small pericardial effusion is present. The pericardial effusion is circumferential. Mitral Valve: The mitral valve is normal in structure. Mitral valve regurgitation is trivial by color flow Doppler. Tricuspid  Valve: The tricuspid valve was normal in structure. Tricuspid valve regurgitation is mild by color flow Doppler. Aortic Valve: The aortic valve is  normal in structure. Aortic valve regurgitation was not visualized by color flow Doppler. There is no stenosis of the aortic valve. There is no evidence of aortic valve vegetation. Pulmonic Valve: The pulmonic valve was normal in structure. Pulmonic valve regurgitation is trivial by color flow Doppler. Aorta: The ascending aorta and aortic root are normal in size and structure. There is evidence of plaque in the descending aorta; Grade III, measuring 3-2m in size. Shunts: There is no evidence of an atrial septal defect.  JNolon NationsMD Electronically signed by JNolon NationsMD Signature Date/Time: 11/14/2021/8:14:05 AM    Final     Cardiac Studies   Intraop TEE:  POST-OP IMPRESSIONS  _ Left Ventricle: The left ventricle is unchanged from pre-bypass.  _ Right Ventricle: The right ventricle appears unchanged from pre-bypass.  _ Aorta: The aorta appears unchanged from pre-bypass.  _ Left Atrial Appendage: The left atrial appendage appears unchanged from  pre-bypass.  _ Aortic Valve: The aortic valve appears unchanged from pre-bypass.  _ Mitral Valve: There is mild regurgitation.  _ Tricuspid Valve: The tricuspid valve appears unchanged from pre-bypass.  _ Pulmonic Valve: The pulmonic valve appears unchanged from pre-bypass.  _ Comments: No significant pericardial effusion.   PRE-OP FINDINGS   Left Ventricle: The left ventricle has hyperdynamic systolic function,  with an ejection fraction of >65%. The cavity size was decreased. There is  severe concentric left ventricular hypertrophy. Left ventricular diastolic  function could not be evaluated.   Patient Profile     70y.o. male with hyperlipidemia admitted with NSTEMI.  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  Assessment & Plan    #NSTEMI: #Left main dissection: #Hyperlipidemia: Left heart cath revealed 75% RCA, 80% ostial circumflex followed by 95% mid left circumflex, 80 to 90% ramus intermediate disease.  He had a  dissected left main and required urgent bypass with LIMA to LAD and SVG to RPDA.  He was on rosuvastatin at home.  He has been switched to atorvastatin this admission.  We will check fasting lipids and switch his rosuvastatin to 40 mg.  We will add beta-blocker as blood pressure allows.  Still on low dose pressors.  #Syncope: Patient had episodes of syncope prior to admission.  He has had some PVCs.  Unclear if the syncope was occurring in the setting of ischemic arrhythmias.  Continue to monitor on telemetry and determine if he needs additional monitoring as an outpatient.  #Hypertension: His home diltiazem is on hold.    #Tobacco abuse: Will need to discuss cessation once he is more awake.  His Precedex was just discontinued.      For questions or updates, please contact CBoulderPlease consult [email or web address] for contact info under        Signed, TSkeet Latch MD  11/14/2021, 10:08 AM

## 2021-11-15 ENCOUNTER — Inpatient Hospital Stay (HOSPITAL_COMMUNITY): Payer: Medicare HMO

## 2021-11-15 LAB — POCT I-STAT 7, (LYTES, BLD GAS, ICA,H+H)
Acid-base deficit: 4 mmol/L — ABNORMAL HIGH (ref 0.0–2.0)
Acid-base deficit: 2 mmol/L (ref 0.0–2.0)
Bicarbonate: 19.9 mmol/L — ABNORMAL LOW (ref 20.0–28.0)
Bicarbonate: 22.4 mmol/L (ref 20.0–28.0)
Calcium, Ion: 1.14 mmol/L — ABNORMAL LOW (ref 1.15–1.40)
Calcium, Ion: 1.16 mmol/L (ref 1.15–1.40)
HCT: 21 % — ABNORMAL LOW (ref 39.0–52.0)
HCT: 23 % — ABNORMAL LOW (ref 39.0–52.0)
Hemoglobin: 7.1 g/dL — ABNORMAL LOW (ref 13.0–17.0)
Hemoglobin: 7.8 g/dL — ABNORMAL LOW (ref 13.0–17.0)
O2 Saturation: 99 %
O2 Saturation: 99 %
Patient temperature: 37.6
Patient temperature: 37.5
Potassium: 3.4 mmol/L — ABNORMAL LOW (ref 3.5–5.1)
Potassium: 3.5 mmol/L (ref 3.5–5.1)
Sodium: 145 mmol/L (ref 135–145)
Sodium: 145 mmol/L (ref 135–145)
TCO2: 21 mmol/L — ABNORMAL LOW (ref 22–32)
TCO2: 24 mmol/L (ref 22–32)
pCO2 arterial: 31.2 mmHg — ABNORMAL LOW (ref 32.0–48.0)
pCO2 arterial: 37.4 mmHg (ref 32.0–48.0)
pH, Arterial: 7.416 (ref 7.350–7.450)
pH, Arterial: 7.389 (ref 7.350–7.450)
pO2, Arterial: 138 mmHg — ABNORMAL HIGH (ref 83.0–108.0)
pO2, Arterial: 140 mmHg — ABNORMAL HIGH (ref 83.0–108.0)

## 2021-11-15 LAB — CBC
HCT: 25.9 % — ABNORMAL LOW (ref 39.0–52.0)
Hemoglobin: 8.5 g/dL — ABNORMAL LOW (ref 13.0–17.0)
MCH: 28.5 pg (ref 26.0–34.0)
MCHC: 32.8 g/dL (ref 30.0–36.0)
MCV: 86.9 fL (ref 80.0–100.0)
Platelets: 122 10*3/uL — ABNORMAL LOW (ref 150–400)
RBC: 2.98 MIL/uL — ABNORMAL LOW (ref 4.22–5.81)
RDW: 14.6 % (ref 11.5–15.5)
WBC: 15.4 10*3/uL — ABNORMAL HIGH (ref 4.0–10.5)
nRBC: 0 % (ref 0.0–0.2)

## 2021-11-15 LAB — BASIC METABOLIC PANEL
Anion gap: 7 (ref 5–15)
BUN: 17 mg/dL (ref 8–23)
CO2: 23 mmol/L (ref 22–32)
Calcium: 8.1 mg/dL — ABNORMAL LOW (ref 8.9–10.3)
Chloride: 108 mmol/L (ref 98–111)
Creatinine, Ser: 1.4 mg/dL — ABNORMAL HIGH (ref 0.61–1.24)
GFR, Estimated: 55 mL/min — ABNORMAL LOW (ref >60)
Glucose, Bld: 102 mg/dL — ABNORMAL HIGH (ref 70–99)
Potassium: 3.9 mmol/L (ref 3.5–5.1)
Sodium: 138 mmol/L (ref 135–145)

## 2021-11-15 LAB — GLUCOSE, CAPILLARY
Glucose-Capillary: 97 mg/dL (ref 70–99)
Glucose-Capillary: 59 mg/dL — ABNORMAL LOW (ref 70–99)
Glucose-Capillary: 155 mg/dL — ABNORMAL HIGH (ref 70–99)
Glucose-Capillary: 96 mg/dL (ref 70–99)
Glucose-Capillary: 79 mg/dL (ref 70–99)
Glucose-Capillary: 118 mg/dL — ABNORMAL HIGH (ref 70–99)
Glucose-Capillary: 104 mg/dL — ABNORMAL HIGH (ref 70–99)

## 2021-11-15 MED ORDER — METOPROLOL TARTRATE 25 MG PO TABS
25.0000 mg | ORAL_TABLET | Freq: Two times a day (BID) | ORAL | Status: DC
  Administered 2021-11-15 – 2021-11-16 (×4): 25 mg via ORAL
  Filled 2021-11-15 (×4): qty 1

## 2021-11-15 MED ORDER — TRAMADOL HCL 50 MG PO TABS
50.0000 mg | ORAL_TABLET | ORAL | Status: DC | PRN
  Administered 2021-11-15 – 2021-11-17 (×4): 50 mg via ORAL
  Filled 2021-11-15 (×4): qty 1

## 2021-11-15 MED ORDER — FE FUMARATE-B12-VIT C-FA-IFC PO CAPS
1.0000 | ORAL_CAPSULE | Freq: Two times a day (BID) | ORAL | Status: DC
  Administered 2021-11-15 – 2021-11-22 (×14): 1 via ORAL
  Filled 2021-11-15 (×14): qty 1

## 2021-11-15 NOTE — Progress Notes (Signed)
3 Days Post-Op Procedure(s) (LRB): CORONARY ARTERY BYPASS GRAFTING (CABG) X TWO, USING LEFT INTERNAL MAMMARY ARTERY AND LEFT LEG GREATER SAPHENOUS VEIN - OPEN HARVEST (N/A) TRANSESOPHAGEAL ECHOCARDIOGRAM (TEE) APPLICATION OF CELL SAVER Subjective: Complains of pain.  Objective: Vital signs in last 24 hours: Temp:  [98.5 F (36.9 C)-100 F (37.8 C)] 99.6 F (37.6 C) (11/26 1115) Pulse Rate:  [107-145] 122 (11/26 1200) Cardiac Rhythm: Sinus tachycardia (11/26 0800) Resp:  [15-39] 23 (11/26 1200) BP: (91-138)/(57-87) 113/85 (11/26 1200) SpO2:  [58 %-99 %] 96 % (11/26 1200) Arterial Line BP: (84-163)/(51-76) 137/73 (11/26 1200) Weight:  [89.2 kg] 89.2 kg (11/26 0500)  Hemodynamic parameters for last 24 hours: CVP:  [4 mmHg-16 mmHg] 13 mmHg  Intake/Output from previous day: 11/25 0701 - 11/26 0700 In: 908.3 [P.O.:180; I.V.:528.3; IV Piggyback:200] Out: 1185 [Urine:875; Chest Tube:310] Intake/Output this shift: Total I/O In: 119.9 [I.V.:119.9] Out: -   General appearance: alert and cooperative Neurologic: intact Heart: regular rate and rhythm, S1, S2 normal, no murmur Lungs: clear to auscultation bilaterally, distant Extremities: extremities normal, atraumatic, no cyanosis or edema Wound: incision ok Chest tube output low, serosanguinous. No air leak. Lab Results: Recent Labs    11/14/21 0445 11/14/21 0633 11/14/21 1059 11/15/21 0315  WBC 12.5*  --   --  15.4*  HGB 8.9*   < > 7.8* 8.5*  HCT 26.5*   < > 23.0* 25.9*  PLT 122*  --   --  122*   < > = values in this interval not displayed.   BMET:  Recent Labs    11/14/21 0445 11/14/21 0633 11/14/21 1059 11/15/21 0315  NA 142   < > 145 138  K 4.1   < > 3.5 3.9  CL 115*  --   --  108  CO2 23  --   --  23  GLUCOSE 137*  --   --  102*  BUN 16  --   --  17  CREATININE 1.39*  --   --  1.40*  CALCIUM 7.9*  --   --  8.1*   < > = values in this interval not displayed.    PT/INR:  Recent Labs    11/13/21 0111   LABPROT 19.4*  INR 1.6*   ABG    Component Value Date/Time   PHART 7.389 11/14/2021 1059   HCO3 22.4 11/14/2021 1059   TCO2 24 11/14/2021 1059   ACIDBASEDEF 2.0 11/14/2021 1059   O2SAT 99.0 11/14/2021 1059   CBG (last 3)  Recent Labs    11/15/21 0803 11/15/21 0907 11/15/21 1121  GLUCAP 59* 155* 96   CXR: bibasilar atelectasis.  Assessment/Plan: S/P Procedure(s) (LRB): CORONARY ARTERY BYPASS GRAFTING (CABG) X TWO, USING LEFT INTERNAL MAMMARY ARTERY AND LEFT LEG GREATER SAPHENOUS VEIN - OPEN HARVEST (N/A) TRANSESOPHAGEAL ECHOCARDIOGRAM (TEE) APPLICATION OF CELL SAVER  POD 3  Hemodynamically stable.  Rhythm is regular 120 and looks like sinus but could be atrial flutter or SVT. I would not expect him to have  continuous sinus tach at 120 for days. Will increase Lopressor to 25 bid. Check ECG in am if it persists.  Wt is below preop if accurate.  Postop acute blood loss anemia. Start iron.  DC arterial line  IS, ambulation  DC chest tubes   LOS: 3 days    Alleen Borne 11/15/2021

## 2021-11-16 ENCOUNTER — Inpatient Hospital Stay (HOSPITAL_COMMUNITY): Payer: Medicare HMO

## 2021-11-16 DIAGNOSIS — Z951 Presence of aortocoronary bypass graft: Secondary | ICD-10-CM

## 2021-11-16 DIAGNOSIS — R079 Chest pain, unspecified: Secondary | ICD-10-CM | POA: Diagnosis not present

## 2021-11-16 DIAGNOSIS — E782 Mixed hyperlipidemia: Secondary | ICD-10-CM

## 2021-11-16 DIAGNOSIS — I2542 Coronary artery dissection: Secondary | ICD-10-CM | POA: Diagnosis not present

## 2021-11-16 DIAGNOSIS — I10 Essential (primary) hypertension: Secondary | ICD-10-CM | POA: Diagnosis not present

## 2021-11-16 DIAGNOSIS — R778 Other specified abnormalities of plasma proteins: Secondary | ICD-10-CM | POA: Diagnosis not present

## 2021-11-16 LAB — BASIC METABOLIC PANEL
Anion gap: 5 (ref 5–15)
BUN: 19 mg/dL (ref 8–23)
CO2: 27 mmol/L (ref 22–32)
Calcium: 7.9 mg/dL — ABNORMAL LOW (ref 8.9–10.3)
Chloride: 108 mmol/L (ref 98–111)
Creatinine, Ser: 1.13 mg/dL (ref 0.61–1.24)
GFR, Estimated: >60 mL/min (ref >60)
Glucose, Bld: 94 mg/dL (ref 70–99)
Potassium: 4.1 mmol/L (ref 3.5–5.1)
Sodium: 140 mmol/L (ref 135–145)

## 2021-11-16 LAB — BPAM RBC
Blood Product Expiration Date: 202212212359
Blood Product Expiration Date: 202212242359
Blood Product Expiration Date: 202212242359
Blood Product Expiration Date: 202212242359
Blood Product Expiration Date: 202212242359
Blood Product Expiration Date: 202212242359
ISSUE DATE / TIME: 202211232034
ISSUE DATE / TIME: 202211232034
ISSUE DATE / TIME: 202211232034
ISSUE DATE / TIME: 202211240904
ISSUE DATE / TIME: 202211241222
Unit Type and Rh: 6200
Unit Type and Rh: 6200
Unit Type and Rh: 6200
Unit Type and Rh: 6200
Unit Type and Rh: 6200
Unit Type and Rh: 6200

## 2021-11-16 LAB — TYPE AND SCREEN
ABO/RH(D): A POS
Antibody Screen: NEGATIVE
Unit division: 0
Unit division: 0
Unit division: 0
Unit division: 0
Unit division: 0
Unit division: 0

## 2021-11-16 LAB — CBC
HCT: 25.5 % — ABNORMAL LOW (ref 39.0–52.0)
Hemoglobin: 8.6 g/dL — ABNORMAL LOW (ref 13.0–17.0)
MCH: 30.2 pg (ref 26.0–34.0)
MCHC: 33.7 g/dL (ref 30.0–36.0)
MCV: 89.5 fL (ref 80.0–100.0)
Platelets: 152 10*3/uL (ref 150–400)
RBC: 2.85 MIL/uL — ABNORMAL LOW (ref 4.22–5.81)
RDW: 14.8 % (ref 11.5–15.5)
WBC: 16.8 10*3/uL — ABNORMAL HIGH (ref 4.0–10.5)
nRBC: 0.5 % — ABNORMAL HIGH (ref 0.0–0.2)

## 2021-11-16 LAB — GLUCOSE, CAPILLARY
Glucose-Capillary: 87 mg/dL (ref 70–99)
Glucose-Capillary: 104 mg/dL — ABNORMAL HIGH (ref 70–99)

## 2021-11-16 MED ORDER — POLYETHYLENE GLYCOL 3350 17 G PO PACK
17.0000 g | PACK | Freq: Every day | ORAL | Status: DC
  Administered 2021-11-16 – 2021-11-22 (×4): 17 g via ORAL
  Filled 2021-11-16 (×6): qty 1

## 2021-11-16 MED ORDER — ASPIRIN 325 MG PO TABS
325.0000 mg | ORAL_TABLET | Freq: Every day | ORAL | Status: DC
  Administered 2021-11-16 – 2021-11-22 (×7): 325 mg via ORAL
  Filled 2021-11-16 (×7): qty 1

## 2021-11-16 MED ORDER — SORBITOL 70 % SOLN
30.0000 mL | Freq: Once | Status: AC
  Administered 2021-11-16: 13:00:00 30 mL via ORAL
  Filled 2021-11-16: qty 30

## 2021-11-16 MED ORDER — DOCUSATE SODIUM 100 MG PO CAPS
200.0000 mg | ORAL_CAPSULE | Freq: Two times a day (BID) | ORAL | Status: DC
  Administered 2021-11-18 – 2021-11-22 (×7): 200 mg via ORAL
  Filled 2021-11-16 (×10): qty 2

## 2021-11-16 MED ORDER — MIDODRINE HCL 5 MG PO TABS
5.0000 mg | ORAL_TABLET | Freq: Three times a day (TID) | ORAL | Status: DC
Start: 2021-11-16 — End: 2021-11-18
  Administered 2021-11-16 – 2021-11-17 (×5): 5 mg via ORAL
  Filled 2021-11-16 (×6): qty 1

## 2021-11-16 NOTE — Progress Notes (Signed)
Patient ID: Dean Mendez, male   DOB: 01/27/1951, 70 y.o.   MRN: [id number] [facility] Evening Rounds  Hemodynamically stable in sinus rhythm 104.  Sats 100%  UO ok  Ambulating.  Should be able to transfer in am.

## 2021-11-16 NOTE — Progress Notes (Signed)
Progress Note  Patient Name: Dean Mendez Date of Encounter: 11/16/2021  [facility] Cardiologist: Carlyle Dolly, MD   Subjective   Some chest discomfort.  Ambulated the hallway without chest pain or shortness of breath.  Denies palpitations.   Inpatient Medications    Scheduled Meds:  acetaminophen  1,000 mg Oral Q6H   Or   acetaminophen (TYLENOL) oral liquid 160 mg/5 mL  1,000 mg Per Tube Q6H   bisacodyl  10 mg Oral Daily   Or   bisacodyl  10 mg Rectal Daily   Chlorhexidine Gluconate Cloth  6 each Topical Daily   docusate sodium  200 mg Oral Daily   ferrous [id number] C-folic acid  1 capsule Oral BID PC   mouth rinse  15 mL Mouth Rinse BID   metoprolol tartrate  25 mg Oral BID   midodrine  5 mg Oral TID WC   pantoprazole  40 mg Oral Daily   rosuvastatin  40 mg Oral Q2000   sodium chloride flush  3 mL Intravenous Q12H   sodium chloride flush  3 mL Intravenous Q12H   sodium chloride flush  3 mL Intravenous Q12H   Continuous Infusions:   PRN Meds: morphine injection, ondansetron (ZOFRAN) IV, oxyCODONE, sodium chloride flush, sodium chloride flush, traMADol   Vital Signs    Vitals:   11/16/21 0700 11/16/21 0724 11/16/21 0800 11/16/21 0900  BP: 100/75  (!) 110/57 (!) 116/91  Pulse: (!) 112  (!) 110 (!) 110  Resp: 19  16 16   Temp:  98.9 F (37.2 C)    TempSrc:  Oral    SpO2: 98%  96% 96%  Weight:      Height:        Intake/Output Summary (Last 24 hours) at 11/16/2021 0951 Last data filed at 11/16/2021 0800 Gross per 24 hour  Intake 549.27 ml  Output 620 ml  Net -70.73 ml   Last 3 Weights 11/16/2021 11/15/2021 11/14/2021  Weight (lbs) 197 lb 8.5 oz 196 lb 10.4 oz 192 lb 3.9 oz  Weight (kg) 89.6 kg 89.2 kg 87.2 kg      Telemetry    Sinus rhythm, sinus tachycardia.  No events.  - Personally Reviewed  ECG    Sinus rhythm.  Rate 100 bpm.  Nonspecific ST-T changes.  - Personally Reviewed  Physical Exam   VS:  BP (!) 116/91    Pulse (!) 110   Temp 98.9 F (37.2 C) (Oral)   Resp 16   Ht 6' (1.829 m)   Wt 89.6 kg   SpO2 96%   BMI 26.79 kg/m  , BMI Body mass index is 26.79 kg/m. GENERAL:  Well appearing HEENT: Pupils equal round and reactive, fundi not visualized, oral mucosa unremarkable NECK:  No jugular venous distention, waveform within normal limits, carotid upstroke brisk and symmetric, no bruits, no thyromegaly LUNGS:  Clear to auscultation bilaterally HEART:  RRR.  PMI not displaced or sustained,S1 and S2 within normal limits, no S3, no S4, no clicks, no rubs, no murmurs ABD:  Flat, positive bowel sounds normal in frequency in pitch, no bruits, no rebound, no guarding, no midline pulsatile mass, no hepatomegaly, no splenomegaly EXT:  2 plus pulses throughout, no edema, no cyanosis no clubbing SKIN:  No rashes no nodules NEURO:  Cranial nerves II through XII grossly intact, motor grossly intact throughout PSYCH:  Cognitively intact, oriented to person place and time    Labs    High Sensitivity Troponin:   Recent  Labs  Lab 11/11/21 1308 11/11/21 1541 11/12/21 0149 11/12/21 0602  TROPONINIHS 13 77* 3,676* 4,175*     Chemistry Recent Labs  Lab 11/12/21 0149 11/12/21 2112 11/13/21 0510 11/13/21 0511 11/13/21 1610 11/13/21 1919 11/14/21 0445 11/14/21 0633 11/14/21 1059 11/15/21 0315 11/16/21 0120  NA 136   < > 139   < > 139   < > 142   < > 145 138 140  K 3.0*   < > 4.6   < > 4.4   < > 4.1   < > 3.5 3.9 4.1  CL 104   < > 110  --  110   < > 115*  --   --  108 108  CO2 24  --  23  --  22   < > 23  --   --  23 27  GLUCOSE 127*   < > 108*  --  168*   < > 137*  --   --  102* 94  BUN 13   < > 11  --  16   < > 16  --   --  17 19  CREATININE 1.04   < > 1.20  --  1.52*   < > 1.39*  --   --  1.40* 1.13  CALCIUM 9.2  --  7.5*  --  7.5*   < > 7.9*  --   --  8.1* 7.9*  MG 2.1  --  2.8*  --  3.1*  --  2.6*  --   --   --   --   PROT 6.5  --   --   --   --   --   --   --   --   --   --   ALBUMIN  3.7  --   --   --   --   --   --   --   --   --   --   AST 34  --   --   --   --   --   --   --   --   --   --   ALT 17  --   --   --   --   --   --   --   --   --   --   ALKPHOS 76  --   --   --   --   --   --   --   --   --   --   BILITOT 0.6  --   --   --   --   --   --   --   --   --   --   GFRNONAA >60  --  >60  --  50*   < > 55*  --   --  55* >60  ANIONGAP 8  --  6  --  7   < > 4*  --   --  7 5   < > = values in this interval not displayed.    Lipids  Recent Labs  Lab 11/14/21 0445  CHOL 46  TRIG 49  HDL 15*  LDLCALC 21  CHOLHDL 3.1    Hematology Recent Labs  Lab 11/14/21 0445 11/14/21 0633 11/14/21 1059 11/15/21 0315 11/16/21 0120  WBC 12.5*  --   --  15.4* 16.8*  RBC 3.09*  --   --  2.98* 2.85*  HGB 8.9*   < >  7.8* 8.5* 8.6*  HCT 26.5*   < > 23.0* 25.9* 25.5*  MCV 85.8  --   --  86.9 89.5  MCH 28.8  --   --  28.5 30.2  MCHC 33.6  --   --  32.8 33.7  RDW 14.1  --   --  14.6 14.8  PLT 122*  --   --  122* 152   < > = values in this interval not displayed.   Thyroid No results for input(s): TSH, FREET4 in the last 168 hours.  BNPNo results for input(s): BNP, PROBNP in the last 168 hours.  DDimer No results for input(s): DDIMER in the last 168 hours.   Radiology    DG CHEST PORT 1 VIEW  Result Date: 11/16/2021 CLINICAL DATA:  Postop from CABG. EXAM: PORTABLE CHEST 1 VIEW COMPARISON:  11/15/2021 FINDINGS: Right jugular central venous catheter remains in place with tip overlying the proximal SVC. No pneumothorax visualized. Stable cardiomegaly and ectasia of thoracic aorta. Previously seen left chest tube is been removed. Small bilateral pleural effusions are unchanged. Persistent atelectasis is seen in both lung bases. Pulmonary hyperinflation is again seen, consistent with COPD. IMPRESSION: Status post left chest tube removal. No pneumothorax visualized. Stable small bilateral pleural effusions and bibasilar atelectasis. Stable cardiomegaly and COPD. Electronically  Signed   By: Marlaine Hind M.D.   On: 11/16/2021 08:18   DG CHEST PORT 1 VIEW  Result Date: 11/15/2021 CLINICAL DATA:  70 year old male status post CABG. EXAM: PORTABLE CHEST 1 VIEW COMPARISON:  Chest x-ray 11/14/2021. FINDINGS: Patient has been extubated. Previously noted nasogastric tube has been removed. Right IJ Cordis in position with what appears to be a Swan-Ganz catheter, the tip of which is in the proximal superior vena cava. Right chest tube with tip in the mid to lower right hemithorax. Left-sided chest tube with tip in the base of the left hemithorax. Mediastinal/pericardial drains again noted. Lung volumes are normal. Bibasilar opacities most compatible with areas of postoperative atelectasis, with superimposed small bilateral pleural effusions (left greater than right). No appreciable pneumothorax. No evidence of pulmonary edema. Mild enlargement of the cardiopericardial silhouette. Upper mediastinal contours are within normal limits. Atherosclerosis in the thoracic aorta. Status post median sternotomy for CABG. IMPRESSION: 1. Support apparatus, as above. 2. Bibasilar opacities most likely reflect areas of postoperative atelectasis with superimposed small bilateral pleural effusions (left-greater-than-right). 3. Aortic atherosclerosis. Electronically Signed   By: Vinnie Langton M.D.   On: 11/15/2021 08:25    Cardiac Studies   Intraop TEE:  POST-OP IMPRESSIONS  _ Left Ventricle: The left ventricle is unchanged from pre-bypass.  _ Right Ventricle: The right ventricle appears unchanged from pre-bypass.  _ Aorta: The aorta appears unchanged from pre-bypass.  _ Left Atrial Appendage: The left atrial appendage appears unchanged from  pre-bypass.  _ Aortic Valve: The aortic valve appears unchanged from pre-bypass.  _ Mitral Valve: There is mild regurgitation.  _ Tricuspid Valve: The tricuspid valve appears unchanged from pre-bypass.  _ Pulmonic Valve: The pulmonic valve appears unchanged  from pre-bypass.  _ Comments: No significant pericardial effusion.   PRE-OP FINDINGS   Left Ventricle: The left ventricle has hyperdynamic systolic function,  with an ejection fraction of >65%. The cavity size was decreased. There is  severe concentric left ventricular hypertrophy. Left ventricular diastolic  function could not be evaluated.   Patient Profile     70 y.o. male with hyperlipidemia admitted with NSTEMI.  He underwent emergent CABG after left main  dissection.  Assessment & Plan    #NSTEMI: #Left main dissection: #Hyperlipidemia: Left heart cath revealed 75% RCA, 80% ostial circumflex followed by 95% mid left circumflex, 80 to 90% ramus intermediate disease.  He had a dissected left main and required urgent bypass with LIMA to LAD and SVG to RPDA.  He was on rosuvastatin at home. LDL was 21 on admission.  Continue metoprolol.   #Syncope: Patient had episodes of syncope prior to admission.  He has had some PVCs.  Unclear if the syncope was occurring in the setting of ischemic arrhythmias.  Continue to monitor on telemetry and determine if he needs additional monitoring as an outpatient prior to discharge.  #Hypertension: His home diltiazem is on hold.  Continue metoprolol.  Midodrine added by surgical team.  #Tobacco abuse: Cessation advised.       For questions or updates, please contact [facility] Please consult [email or web address] for contact info under        Signed, Chilton Si, MD  11/16/2021, 9:51 AM

## 2021-11-16 NOTE — Plan of Care (Signed)
  Problem: Education: Goal: Knowledge of General Education information will improve Description: Including pain rating scale, medication(s)/side effects and non-pharmacologic comfort measures Outcome: Progressing   Problem: Health Behavior/Discharge Planning: Goal: Ability to manage health-related needs will improve Outcome: Progressing   Problem: Clinical Measurements: Goal: Ability to maintain clinical measurements within normal limits will improve Outcome: Progressing Goal: Will remain free from infection Outcome: Progressing Goal: Diagnostic test results will improve Outcome: Progressing Goal: Respiratory complications will improve Outcome: Progressing Goal: Cardiovascular complication will be avoided Outcome: Progressing   Problem: Activity: Goal: Risk for activity intolerance will decrease Outcome: Progressing   Problem: Nutrition: Goal: Adequate nutrition will be maintained Outcome: Progressing   Problem: Pain Managment: Goal: General experience of comfort will improve Outcome: Progressing   Problem: Safety: Goal: Ability to remain free from injury will improve Outcome: Progressing   Problem: Education: Goal: Will demonstrate proper wound care and an understanding of methods to prevent future damage Outcome: Progressing Goal: Knowledge of disease or condition will improve Outcome: Progressing Goal: Knowledge of the prescribed therapeutic regimen will improve Outcome: Progressing Goal: Individualized Educational Video(s) Outcome: Progressing   Problem: Activity: Goal: Risk for activity intolerance will decrease Outcome: Progressing   Problem: Cardiac: Goal: Will achieve and/or maintain hemodynamic stability Outcome: Progressing   Problem: Clinical Measurements: Goal: Postoperative complications will be avoided or minimized Outcome: Progressing   Problem: Respiratory: Goal: Respiratory status will improve Outcome: Progressing   Problem: Skin  Integrity: Goal: Wound healing without signs and symptoms of infection Outcome: Progressing Goal: Risk for impaired skin integrity will decrease Outcome: Progressing   Problem: Urinary Elimination: Goal: Ability to achieve and maintain adequate renal perfusion and functioning will improve Outcome: Progressing

## 2021-11-16 NOTE — Progress Notes (Signed)
4 Days Post-Op Procedure(s) (LRB): CORONARY ARTERY BYPASS GRAFTING (CABG) X TWO, USING LEFT INTERNAL MAMMARY ARTERY AND LEFT LEG GREATER SAPHENOUS VEIN - OPEN HARVEST (N/A) TRANSESOPHAGEAL ECHOCARDIOGRAM (TEE) APPLICATION OF CELL SAVER Subjective: No complaints.  Objective: Vital signs in last 24 hours: Temp:  [98.2 F (36.8 C)-99.6 F (37.6 C)] 98.9 F (37.2 C) (11/27 0724) Pulse Rate:  [90-122] 110 (11/27 0800) Cardiac Rhythm: Normal sinus rhythm (11/27 0400) Resp:  [11-23] 16 (11/27 0800) BP: (92-136)/(57-85) 110/57 (11/27 0800) SpO2:  [96 %-100 %] 96 % (11/27 0800) Arterial Line BP: (113-137)/(64-73) 113/64 (11/26 1400) Weight:  [89.6 kg] 89.6 kg (11/27 0500)  Hemodynamic parameters for last 24 hours: CVP:  [10 mmHg-13 mmHg] 10 mmHg  Intake/Output from previous day: 11/26 0701 - 11/27 0700 In: 489.2 [I.V.:489.2] Out: 620 [Urine:620] Intake/Output this shift: Total I/O In: 120 [P.O.:120] Out: -   General appearance: alert and cooperative Neurologic: intact Heart: regular rate and rhythm, S1, S2 normal, no murmur Lungs: clear to auscultation bilaterally Extremities: extremities normal, atraumatic, no cyanosis or edema Wound: incision ok  Lab Results: Recent Labs    11/15/21 0315 11/16/21 0120  WBC 15.4* 16.8*  HGB 8.5* 8.6*  HCT 25.9* 25.5*  PLT 122* 152   BMET:  Recent Labs    11/15/21 0315 11/16/21 0120  NA 138 140  K 3.9 4.1  CL 108 108  CO2 23 27  GLUCOSE 102* 94  BUN 17 19  CREATININE 1.40* 1.13  CALCIUM 8.1* 7.9*    PT/INR: No results for input(s): LABPROT, INR in the last 72 hours. ABG    Component Value Date/Time   PHART 7.389 11/14/2021 1059   HCO3 22.4 11/14/2021 1059   TCO2 24 11/14/2021 1059   ACIDBASEDEF 2.0 11/14/2021 1059   O2SAT 99.0 11/14/2021 1059   CBG (last 3)  Recent Labs    11/15/21 2308 11/16/21 0356 11/16/21 0722  GLUCAP 104* 87 104*    Assessment/Plan: S/P Procedure(s) (LRB): CORONARY ARTERY BYPASS  GRAFTING (CABG) X TWO, USING LEFT INTERNAL MAMMARY ARTERY AND LEFT LEG GREATER SAPHENOUS VEIN - OPEN HARVEST (N/A) TRANSESOPHAGEAL ECHOCARDIOGRAM (TEE) APPLICATION OF CELL SAVER  POD 4 Hemodynamically stable but borderline BP. He needs to stay on Lopressor for sinus tachycardia. Will start midodrine 5 tid.  Wt is at preop so doesn't need diuresis.  DC neck line.  Continue IS, ambulation.   LOS: 4 days    Alleen Borne 11/16/2021

## 2021-11-16 NOTE — Progress Notes (Signed)
EKG CRITICAL VALUE     12 lead EKG performed.  Critical value noted.  Marletta Lor, RN notified.   Alto Denver, CCT 11/16/2021 9:23 AM

## 2021-11-17 MED ORDER — SODIUM CHLORIDE 0.9 % IV SOLN: 250.0000 mL | INTRAVENOUS | Status: DC | PRN

## 2021-11-17 MED ORDER — ~~LOC~~ CARDIAC SURGERY, PATIENT & FAMILY EDUCATION: Freq: Once | Status: AC

## 2021-11-17 MED ORDER — SODIUM CHLORIDE 0.9% FLUSH
3.0000 mL | Freq: Two times a day (BID) | INTRAVENOUS | Status: DC
  Administered 2021-11-17 (×2): 3 mL via INTRAVENOUS

## 2021-11-17 MED ORDER — METOPROLOL TARTRATE 25 MG PO TABS
37.5000 mg | ORAL_TABLET | Freq: Two times a day (BID) | ORAL | Status: DC
  Administered 2021-11-17 – 2021-11-18 (×4): 37.5 mg via ORAL
  Filled 2021-11-17 (×4): qty 1

## 2021-11-17 MED ORDER — SODIUM CHLORIDE 0.9% FLUSH: 3.0000 mL | INTRAVENOUS | Status: DC | PRN

## 2021-11-17 NOTE — Addendum Note (Signed)
Addendum  created 11/17/21 0851 by Adair Laundry, CRNA   Order list changed, Pharmacy for encounter modified

## 2021-11-17 NOTE — Evaluation (Signed)
Physical Therapy Evaluation Patient Details Name: Dean Mendez MRN: [id number] DOB: [birth year] Today's Date: 11/17/2021  History of Present Illness  70 yo admitted 11/22 after syncope at home with NSTEMI and left main dissection. 11/23 IABP. 11/24 s/p CABG x2. PMhx: HTn, HLD  Clinical Impression  Pt pleasant and sitting in chair with wife present. Pt educated for sternal precautions, transfers, gait and safety. Pt with decreased transfers, gait and function who will benefit from acute therapy to maximize mobility, safety and adherence to sternal precautions. Handout provided and discussed with pt with daily ambulation and up for meals encouraged.   Pre gt BP 103/64 with HR 95  Post gait 92/68 (77), HR 115 SPO2 97% on 2L at rest with pleth unable to be maintained during gait     Recommendations for follow up therapy are one component of a multi-disciplinary discharge planning process, led by the attending physician.  Recommendations may be updated based on patient status, additional functional criteria and insurance authorization.  Follow Up Recommendations No PT follow up    Assistance Recommended at Discharge Intermittent Supervision/Assistance  Functional Status Assessment Patient has had a recent decline in their functional status and demonstrates the ability to make significant improvements in function in a reasonable and predictable amount of time.  Equipment Recommendations  Rolling walker (2 wheels);BSC/3in1    Recommendations for Other Services       Precautions / Restrictions Precautions Precautions: Sternal Precaution Booklet Issued: Yes (comment)      Mobility  Bed Mobility               General bed mobility comments: OOB on arrival    Transfers Overall transfer level: Needs assistance   Transfers: Sit to/from Stand Sit to Stand: Min guard           General transfer comment: cues for hands on thighs to rise and sit     Ambulation/Gait Ambulation/Gait assistance: Min guard Gait Distance (Feet): 400 Feet Assistive device: Rolling walker (2 wheels) Gait Pattern/deviations: Step-through pattern;Decreased stride length   Gait velocity interpretation: 1.31 - 2.62 ft/sec, indicative of limited community ambulator   General Gait Details: cues for posture and breathing technique  Stairs            Wheelchair Mobility    Modified Rankin (Stroke Patients Only)       Balance Overall balance assessment: Mild deficits observed, not formally tested                                           Pertinent Vitals/Pain Pain Assessment: 0-10 Pain Score: 3  Pain Location: chest Pain Descriptors / Indicators: Aching;Guarding Pain Intervention(s): Limited activity within patient's tolerance;Monitored during session;Repositioned    Home Living Family/patient expects to be discharged to:: Private residence Living Arrangements: Spouse/significant other Available Help at Discharge: Family;Available 24 hours/day Type of Home: House Home Access: Level entry       Home Layout: One level Home Equipment: None      Prior Function Prior Level of Function : Independent/Modified Independent             Mobility Comments: driving, and caring for himself before sx       Hand Dominance        Extremity/Trunk Assessment   Upper Extremity Assessment Upper Extremity Assessment: Overall WFL for tasks assessed    Lower Extremity Assessment Lower Extremity  Assessment: Overall WFL for tasks assessed    Cervical / Trunk Assessment Cervical / Trunk Assessment: Normal  Communication   Communication: No difficulties  Cognition Arousal/Alertness: Awake/alert Behavior During Therapy: WFL for tasks assessed/performed Overall Cognitive Status: Within Functional Limits for tasks assessed                                          General Comments      Exercises  General Exercises - Lower Extremity Long Arc Quad: AROM;Both;Seated;10 reps Hip Flexion/Marching: AROM;Both;Seated;10 reps   Assessment/Plan    PT Assessment Patient needs continued PT services  PT Problem List Decreased mobility;Decreased activity tolerance;Decreased knowledge of use of DME;Decreased knowledge of precautions       PT Treatment Interventions Gait training;Functional mobility training;Therapeutic activities;Patient/family education;DME instruction;Therapeutic exercise    PT Goals (Current goals can be found in the Care Plan section)  Acute Rehab PT Goals Patient Stated Goal: return home and to driving PT Goal Formulation: With patient Time For Goal Achievement: 12/01/21 Potential to Achieve Goals: Fair    Frequency Min 3X/week   Barriers to discharge        Co-evaluation               AM-PAC PT "6 Clicks" Mobility  Outcome Measure Help needed turning from your back to your side while in a flat bed without using bedrails?: A Little Help needed moving from lying on your back to sitting on the side of a flat bed without using bedrails?: A Little Help needed moving to and from a bed to a chair (including a wheelchair)?: A Little Help needed standing up from a chair using your arms (e.g., wheelchair or bedside chair)?: A Little Help needed to walk in hospital room?: A Little Help needed climbing 3-5 steps with a railing? : A Little 6 Click Score: 18    End of Session Equipment Utilized During Treatment: Gait belt Activity Tolerance: Patient tolerated treatment well Patient left: in chair;with call bell/phone within reach;with family/visitor present Nurse Communication: Mobility status PT Visit Diagnosis: Other abnormalities of gait and mobility (R26.89);Difficulty in walking, not elsewhere classified (R26.2)    Time: 6384-5364 PT Time Calculation (min) (ACUTE ONLY): 29 min   Charges:   PT Evaluation $PT Eval Moderate Complexity: 1 Mod PT  Treatments $Gait Training: 8-22 mins        Fannye Myer P, PT Acute Rehabilitation Services Pager: [phone] Office: [phone]   Leniya Breit B Fateh Kindle 11/17/2021, 1:02 PM

## 2021-11-17 NOTE — Progress Notes (Signed)
      [address]       [place] [address]             [phone]                 5 Days Post-Op Procedure(s) (LRB): CORONARY ARTERY BYPASS GRAFTING (CABG) X TWO, USING LEFT INTERNAL MAMMARY ARTERY AND LEFT LEG GREATER SAPHENOUS VEIN - OPEN HARVEST (N/A) TRANSESOPHAGEAL ECHOCARDIOGRAM (TEE) APPLICATION OF CELL SAVER   Events: No events _______________________________________________________________ Vitals: BP 120/82   Pulse 61   Temp 98.4 F (36.9 C) (Oral)   Resp (!) 21   Ht 6' (1.829 m)   Wt 88.8 kg   SpO2 99%   BMI 26.55 kg/m  Filed Weights   11/15/21 0500 11/16/21 0500 11/17/21 0530  Weight: 89.2 kg 89.6 kg 88.8 kg     - Neuro: alert NAD  - Cardiovascular: sinus tach  Drips: none.      - Pulm: EWOB    ABG    Component Value Date/Time   PHART 7.389 11/14/2021 1059   PCO2ART 37.4 11/14/2021 1059   PO2ART 140 (H) 11/14/2021 1059   HCO3 22.4 11/14/2021 1059   TCO2 24 11/14/2021 1059   ACIDBASEDEF 2.0 11/14/2021 1059   O2SAT 99.0 11/14/2021 1059    - Abd: ND - Extremity: trace edema  .Intake/Output      11/27 0701 11/28 0700 11/28 0701 11/29 0700   P.O. 600    I.V. (mL/kg)     Total Intake(mL/kg) 600 (6.8)    Urine (mL/kg/hr) 625 (0.3)    Stool 0    Total Output 625    Net -25         Stool Occurrence 2 x       _______________________________________________________________ Labs: CBC Latest Ref Rng & Units 11/16/2021 11/15/2021 11/14/2021  WBC 4.0 - 10.5 K/uL 16.8(H) 15.4(H) -  Hemoglobin 13.0 - 17.0 g/dL 1.4(G) 8.1(E) 7.8(L)  Hematocrit 39.0 - 52.0 % 25.5(L) 25.9(L) 23.0(L)  Platelets 150 - 400 K/uL 152 122(L) -   CMP Latest Ref Rng & Units 11/16/2021 11/15/2021 11/14/2021  Glucose 70 - 99 mg/dL 94 563(J) -  BUN 8 - 23 mg/dL 19 17 -  Creatinine 4.97 - 1.24 mg/dL 0.26 3.78(H) -  Sodium 135 - 145 mmol/L 140 138 145  Potassium 3.5 - 5.1 mmol/L 4.1 3.9 3.5  Chloride 98 - 111 mmol/L 108 108 -  CO2 22 - 32 mmol/L 27  23 -  Calcium 8.9 - 10.3 mg/dL 7.9(L) 8.1(L) -  Total Protein 6.5 - 8.1 g/dL - - -  Total Bilirubin 0.3 - 1.2 mg/dL - - -  Alkaline Phos 38 - 126 U/L - - -  AST 15 - 41 U/L - - -  ALT 0 - 44 U/L - - -    CXR: -  _______________________________________________________________  Assessment and Plan: POD 5 s/p emergency CABG  Neuro: pain controlled CV: increasing BB.  On midodrine.  On A/S.  Will start plavix after wires removed.   Pulm: wean O2. Renal: creat stable.  Good uop.  Below baseline weight GI: on diet Heme: stable ID: afebrile Endo: SSI Dispo: floor today   Corliss Skains 11/17/2021 7:40 AM

## 2021-11-17 NOTE — Progress Notes (Signed)
Progress Note  Patient Name: Dean Mendez Date of Encounter: 11/17/2021  [facility] Cardiologist: Dina Rich, MD   Subjective   Sitting in his chair in no distress. Dropped O2 from 4L to 2L satting in the high 90s. He notes coughing up phlegm   Sinus tachycardia. BP stable. On minimal O2  Crt 1.13, WBC 16, Hgb 8.6  POD 5 CORONARY ARTERY BYPASS GRAFTING (CABG) X TWO, USING LEFT INTERNAL MAMMARY ARTERY AND LEFT LEG GREATER SAPHENOUS VEIN - OPEN HARVEST (N/A) TRANSESOPHAGEAL ECHOCARDIOGRAM (TEE) APPLICATION OF CELL SAVER  Cxray 11/16/2021 Small BL pleural effusions and bibasilar atelectasis  Inpatient Medications    Scheduled Meds:  acetaminophen  1,000 mg Oral Q6H   Or   acetaminophen (TYLENOL) oral liquid 160 mg/5 mL  1,000 mg Per Tube Q6H   aspirin  325 mg Oral Daily   bisacodyl  10 mg Oral Daily   Or   bisacodyl  10 mg Rectal Daily   Chlorhexidine Gluconate Cloth  6 each Topical Daily   [facility], Patient & Family Education   Does not apply Once   docusate sodium  200 mg Oral BID   ferrous fumarate-b12-vitamic C-folic acid  1 capsule Oral BID PC   mouth rinse  15 mL Mouth Rinse BID   metoprolol tartrate  37.5 mg Oral BID   midodrine  5 mg Oral TID WC   pantoprazole  40 mg Oral Daily   polyethylene glycol  17 g Oral Daily   rosuvastatin  40 mg Oral Q2000   sodium chloride flush  3 mL Intravenous Q12H   Continuous Infusions:  sodium chloride     PRN Meds: sodium chloride, morphine injection, ondansetron (ZOFRAN) IV, oxyCODONE, sodium chloride flush, traMADol   Vital Signs    Vitals:   11/17/21 0400 11/17/21 0500 11/17/21 0530 11/17/21 0725  BP: 120/76 117/64 120/82   Pulse: 87 93 61   Resp: 20 18 (!) 21   Temp:    98.4 F (36.9 C)  TempSrc:    Oral  SpO2: 100% 100% 99%   Weight:   88.8 kg   Height:        Intake/Output Summary (Last 24 hours) at 11/17/2021 0801 Last data filed at 11/17/2021 0500 Gross per 24  hour  Intake 480 ml  Output 625 ml  Net -145 ml   Last 3 Weights 11/17/2021 11/16/2021 11/15/2021  Weight (lbs) 195 lb 12.3 oz 197 lb 8.5 oz 196 lb 10.4 oz  Weight (kg) 88.8 kg 89.6 kg 89.2 kg      Telemetry     Sinus tachycardia, sinus rhythm. Minimal VTE  - Personally Reviewed  ECG    Sinus tachycardia, early repol, minimal STE inferior leads- Personally Reviewed  Physical Exam   VS:  BP 120/82   Pulse 61   Temp 98.4 F (36.9 C) (Oral)   Resp (!) 21   Ht 6' (1.829 m)   Wt 88.8 kg   SpO2 99%   BMI 26.55 kg/m  , BMI Body mass index is 26.55 kg/m.  Physical Exam Gen: thin, elderly gentleman  Neuro: alert and oriented CV: r,r,r no murmurs. No JVD. Bandage over right IJ/carotid, clean and dry Vasc: 2+ radial pulses Pulm: no increased wob, decreased breath sounds at the bases Abd: non distended Ext: No LE edema Skin: warm and well perfused Psych: normal mood    Labs    High Sensitivity Troponin:   Recent Labs  Lab 11/11/21 1308  11/11/21 1541 11/12/21 0149 11/12/21 0602  TROPONINIHS 13 77* 3,676* 4,175*     Chemistry Recent Labs  Lab 11/12/21 0149 11/12/21 2112 11/13/21 0510 11/13/21 0511 11/13/21 1610 11/13/21 1919 11/14/21 0445 11/14/21 0633 11/14/21 1059 11/15/21 0315 11/16/21 0120  NA 136   < > 139   < > 139   < > 142   < > 145 138 140  K 3.0*   < > 4.6   < > 4.4   < > 4.1   < > 3.5 3.9 4.1  CL 104   < > 110  --  110   < > 115*  --   --  108 108  CO2 24  --  23  --  22   < > 23  --   --  23 27  GLUCOSE 127*   < > 108*  --  168*   < > 137*  --   --  102* 94  BUN 13   < > 11  --  16   < > 16  --   --  17 19  CREATININE 1.04   < > 1.20  --  1.52*   < > 1.39*  --   --  1.40* 1.13  CALCIUM 9.2  --  7.5*  --  7.5*   < > 7.9*  --   --  8.1* 7.9*  MG 2.1  --  2.8*  --  3.1*  --  2.6*  --   --   --   --   PROT 6.5  --   --   --   --   --   --   --   --   --   --   ALBUMIN 3.7  --   --   --   --   --   --   --   --   --   --   AST 34  --   --   --    --   --   --   --   --   --   --   ALT 17  --   --   --   --   --   --   --   --   --   --   ALKPHOS 76  --   --   --   --   --   --   --   --   --   --   BILITOT 0.6  --   --   --   --   --   --   --   --   --   --   GFRNONAA >60  --  >60  --  50*   < > 55*  --   --  55* >60  ANIONGAP 8  --  6  --  7   < > 4*  --   --  7 5   < > = values in this interval not displayed.    Lipids  Recent Labs  Lab 11/14/21 0445  CHOL 46  TRIG 49  HDL 15*  LDLCALC 21  CHOLHDL 3.1    Hematology Recent Labs  Lab 11/14/21 0445 11/14/21 0633 11/14/21 1059 11/15/21 0315 11/16/21 0120  WBC 12.5*  --   --  15.4* 16.8*  RBC 3.09*  --   --  2.98* 2.85*  HGB 8.9*   < > 7.8*  8.5* 8.6*  HCT 26.5*   < > 23.0* 25.9* 25.5*  MCV 85.8  --   --  86.9 89.5  MCH 28.8  --   --  28.5 30.2  MCHC 33.6  --   --  32.8 33.7  RDW 14.1  --   --  14.6 14.8  PLT 122*  --   --  122* 152   < > = values in this interval not displayed.   Thyroid No results for input(s): TSH, FREET4 in the last 168 hours.  BNPNo results for input(s): BNP, PROBNP in the last 168 hours.  DDimer No results for input(s): DDIMER in the last 168 hours.   Radiology    DG CHEST PORT 1 VIEW  Result Date: 11/16/2021 CLINICAL DATA:  Postop from CABG. EXAM: PORTABLE CHEST 1 VIEW COMPARISON:  11/15/2021 FINDINGS: Right jugular central venous catheter remains in place with tip overlying the proximal SVC. No pneumothorax visualized. Stable cardiomegaly and ectasia of thoracic aorta. Previously seen left chest tube is been removed. Small bilateral pleural effusions are unchanged. Persistent atelectasis is seen in both lung bases. Pulmonary hyperinflation is again seen, consistent with COPD. IMPRESSION: Status post left chest tube removal. No pneumothorax visualized. Stable small bilateral pleural effusions and bibasilar atelectasis. Stable cardiomegaly and COPD. Electronically Signed   By: Danae Orleans M.D.   On: 11/16/2021 08:18    Cardiac Studies    Intraop TEE:  POST-OP IMPRESSIONS  _ Left Ventricle: The left ventricle is unchanged from pre-bypass.  _ Right Ventricle: The right ventricle appears unchanged from pre-bypass.  _ Aorta: The aorta appears unchanged from pre-bypass.  _ Left Atrial Appendage: The left atrial appendage appears unchanged from  pre-bypass.  _ Aortic Valve: The aortic valve appears unchanged from pre-bypass.  _ Mitral Valve: There is mild regurgitation.  _ Tricuspid Valve: The tricuspid valve appears unchanged from pre-bypass.  _ Pulmonic Valve: The pulmonic valve appears unchanged from pre-bypass.  _ Comments: No significant pericardial effusion.   PRE-OP FINDINGS   Left Ventricle: The left ventricle has hyperdynamic systolic function,  with an ejection fraction of >65%. The cavity size was decreased. There is  severe concentric left ventricular hypertrophy. Left ventricular diastolic  function could not be evaluated.   Patient Profile     70 y.o. male with hyperlipidemia admitted with NSTEMI.  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  Assessment & Plan    #NSTEMI:  #Left main dissection: #Hyperlipidemia: Left heart cath revealed 75% RCA, 80% ostial circumflex followed by 95% mid left circumflex, 80 to 90% ramus intermediate disease.  He had a dissected left main and required urgent bypass with LIMA to LAD and SVG to RPDA. Switched to atorvastatin now back on home crestor 40 mg. - continue  crestor 40 mg daily - continue metop tartrate 37.5 mg BID - likely can transition to ASA 81 mg daily, can defer to surgery  #Tachycardia: he's euvolemic. No pain. He had decreased BS in the bases and his white count is rising, may be potential pneumonia - consider blood cultures if fevers and starting HAP treatment  #Syncope: Patient had episodes of syncope prior to admission. No evidence of persistent VT/VF. No bradyarrhythmia.   #Hypertension: His home diltiazem is on hold, can consider  stopping if Bps well controlled on BB. On midodrine post op  #Tobacco abuse: Will need to discuss cessation once he is more awake.  His Precedex was just discontinued.     For questions or  updates, please contact [facility] Please consult [email or web address] for contact info under        Signed, Maisie Fus, MD  11/17/2021, 8:01 AM

## 2021-11-17 NOTE — Progress Notes (Signed)
Patient transferred from [facility] to 4E room 3. Patient oriented to room, VSS, heart monitor on and CCMD notified. Patient resting comfortably with call bell and phone within reach. Bed alarm on.  Netta Corrigan, RN

## 2021-11-17 NOTE — Progress Notes (Signed)
CARDIAC REHAB PHASE I   Offered to walk with pt. Pt states ambulation with PA ~1h ago. Pt feeling strong, able to use front wheel walker. Encouraged continued IS use and ambulation. Call bell within reach. Will continue to follow.  [id number] Reynold Bowen, RN BSN 11/17/2021 1:58 PM

## 2021-11-17 NOTE — Progress Notes (Signed)
Ventricular epicardial pacing wire removed per policy.  Line fully intact upon removal, no resistance met.  Pt tolerated procedure well.  Bedrest maintained for 1 hour, VS per EMR.

## 2021-11-18 LAB — CBC
HCT: 23 % — ABNORMAL LOW (ref 39.0–52.0)
Hemoglobin: 7.3 g/dL — ABNORMAL LOW (ref 13.0–17.0)
MCH: 28.6 pg (ref 26.0–34.0)
MCHC: 31.7 g/dL (ref 30.0–36.0)
MCV: 90.2 fL (ref 80.0–100.0)
Platelets: 211 10*3/uL (ref 150–400)
RBC: 2.55 MIL/uL — ABNORMAL LOW (ref 4.22–5.81)
RDW: 14.6 % (ref 11.5–15.5)
WBC: 10.3 10*3/uL (ref 4.0–10.5)
nRBC: 1.2 % — ABNORMAL HIGH (ref 0.0–0.2)

## 2021-11-18 LAB — BASIC METABOLIC PANEL
Anion gap: 4 — ABNORMAL LOW (ref 5–15)
BUN: 14 mg/dL (ref 8–23)
CO2: 28 mmol/L (ref 22–32)
Calcium: 8 mg/dL — ABNORMAL LOW (ref 8.9–10.3)
Chloride: 109 mmol/L (ref 98–111)
Creatinine, Ser: 1.25 mg/dL — ABNORMAL HIGH (ref 0.61–1.24)
GFR, Estimated: >60 mL/min (ref >60)
Glucose, Bld: 108 mg/dL — ABNORMAL HIGH (ref 70–99)
Potassium: 3.9 mmol/L (ref 3.5–5.1)
Sodium: 141 mmol/L (ref 135–145)

## 2021-11-18 NOTE — Progress Notes (Signed)
[address]       [place] [address]             [phone]      6 Days Post-Op Procedure(s) (LRB): CORONARY ARTERY BYPASS GRAFTING (CABG) X TWO, USING LEFT INTERNAL MAMMARY ARTERY AND LEFT LEG GREATER SAPHENOUS VEIN - OPEN HARVEST (N/A) TRANSESOPHAGEAL ECHOCARDIOGRAM (TEE) APPLICATION OF CELL SAVER Subjective: Fairly weak  Objective: Vital signs in last 24 hours: Temp:  [97.9 F (36.6 C)-99.1 F (37.3 C)] 98.5 F (36.9 C) (11/29 0737) Pulse Rate:  [79-128] 99 (11/29 0737) Cardiac Rhythm: Normal sinus rhythm (11/29 0345) Resp:  [15-27] 18 (11/29 0737) BP: (90-147)/(47-94) 141/90 (11/29 0737) SpO2:  [86 %-100 %] 96 % (11/29 0737) Weight:  [89.8 kg] 89.8 kg (11/29 0444)  Hemodynamic parameters for last 24 hours:    Intake/Output from previous day: 11/28 0701 - 11/29 0700 In: 1193 [P.O.:1190; I.V.:3] Out: 825 [Urine:825] Intake/Output this shift: No intake/output data recorded.  General appearance: alert, cooperative, and no distress Heart: regular rate and rhythm Lungs: dim in lower fields Abdomen: benign Extremities: no edema Wound: incis healing well  Lab Results: Recent Labs    11/16/21 0120 11/18/21 0054  WBC 16.8* 10.3  HGB 8.6* 7.3*  HCT 25.5* 23.0*  PLT 152 211   BMET:  Recent Labs    11/16/21 0120 11/18/21 0054  NA 140 141  K 4.1 3.9  CL 108 109  CO2 27 28  GLUCOSE 94 108*  BUN 19 14  CREATININE 1.13 1.25*  CALCIUM 7.9* 8.0*    PT/INR: No results for input(s): LABPROT, INR in the last 72 hours. ABG    Component Value Date/Time   PHART 7.389 11/14/2021 1059   HCO3 22.4 11/14/2021 1059   TCO2 24 11/14/2021 1059   ACIDBASEDEF 2.0 11/14/2021 1059   O2SAT 99.0 11/14/2021 1059   CBG (last 3)  Recent Labs    11/15/21 2308 11/16/21 0356 11/16/21 0722  GLUCAP 104* 87 104*    Meds Scheduled Meds:  aspirin  325 mg Oral Daily   bisacodyl  10 mg Oral Daily   Or   bisacodyl  10 mg Rectal Daily    Chlorhexidine Gluconate Cloth  6 each Topical Daily   docusate sodium  200 mg Oral BID   ferrous fumarate-b12-vitamic C-folic acid  1 capsule Oral BID PC   mouth rinse  15 mL Mouth Rinse BID   metoprolol tartrate  37.5 mg Oral BID   midodrine  5 mg Oral TID WC   pantoprazole  40 mg Oral Daily   polyethylene glycol  17 g Oral Daily   rosuvastatin  40 mg Oral Q2000   sodium chloride flush  3 mL Intravenous Q12H   Continuous Infusions:  sodium chloride     PRN Meds:.sodium chloride, morphine injection, ondansetron (ZOFRAN) IV, oxyCODONE, sodium chloride flush, traMADol  Xrays No results found.  Assessment/Plan: S/P Procedure(s) (LRB): CORONARY ARTERY BYPASS GRAFTING (CABG) X TWO, USING LEFT INTERNAL MAMMARY ARTERY AND LEFT LEG GREATER SAPHENOUS VEIN - OPEN HARVEST (N/A) TRANSESOPHAGEAL ECHOCARDIOGRAM (TEE) APPLICATION OF CELL SAVER  1 Tmax 99.1, S BP 90-147, sinus rhythm- midodrine  stopped as BP readings mostly on higher end 2 sats good on 3 liters [place], pulm toilet / wean- recheck CXR to follow status of effus in am 3 good UOP, weight stable 4 creat up slightly from 1.13- 1.25 today 5 H/H 7.3/23- slightly down from 2 days ago, on trinsicon, close to transfusion  threshold 6 CBG well controlled 7 ? Plavix- cardiology states ASA in their note 8 cont PT/rehab modalities- making steady overall progress   LOS: 6 days    Rowe Clack PA-C Pager [phone] 11/18/2021

## 2021-11-18 NOTE — Progress Notes (Signed)
   11/18/21 2213  Vitals  Temp (!) 100.8 F (38.2 C)  Temp Source Oral  BP (!) 134/96  MAP (mmHg) 108  BP Location Left Arm  BP Method Automatic  Patient Position (if appropriate) Lying  Pulse Rate (!) 103  Pulse Rate Source Monitor  ECG Heart Rate (!) 102  Resp (!) 21  Level of Consciousness  Level of Consciousness Alert  MEWS COLOR  MEWS Score Color Yellow  Oxygen Therapy  SpO2 91 %  O2 Device Nasal Cannula  O2 Flow Rate (L/min) 2 L/min  Patient Activity (if Appropriate) In bed  Pain Assessment  Pain Scale 0-10  Pain Score 0  MEWS Score  MEWS Temp 1  MEWS Systolic 0  MEWS Pulse 1  MEWS RR 1  MEWS LOC 0  MEWS Score 3  No PRN Tylenol or standing orders. Enc. CDB and I.S. and enc some fluids . Triish R.N. aware cont to monitor, vital signs.

## 2021-11-18 NOTE — Care Management Important Message (Signed)
Important Message  Patient Details  Name: Dean Mendez MRN: [id number] Date of Birth: Aug 23, 1951   Medicare Important Message Given:  Yes     Wadie Lessen 11/18/2021, 12:12 PM

## 2021-11-18 NOTE — Discharge Instructions (Signed)

## 2021-11-18 NOTE — Progress Notes (Signed)
Physical Therapy Treatment Patient Details Name: Dean Mendez MRN: [id number] DOB: [birth year] Today's Date: 11/18/2021   History of Present Illness Pt is a 70 y.o. male admitted 11/11/21 after sycnopal episode at home. Pt with NSTEMI and left main dissection; IABP insertion 11/23. S/p emergent CABGx2 on 11/24. PMH includes HTN, HLD.   PT Comments    Pt progressing with mobility. Today's session focused on mobility training while maintaining sternal precautions, as well as ambulation for improving activity tolerance. Pt moving well with rollator and intermittent min guard for balance. Pt remains limited by generalized weakness and decreased activity tolerance. Recommend follow-up with HHPT services to maximize functional mobility and independence upon return home.  SpO2 94% on 3L O2 with ambulation when pleth reliable    Recommendations for follow up therapy are one component of a multi-disciplinary discharge planning process, led by the attending physician.  Recommendations may be updated based on patient status, additional functional criteria and insurance authorization.  Follow Up Recommendations  Home health PT     Assistance Recommended at Discharge Intermittent Supervision/Assistance  Equipment Recommendations  Rollator (4 wheels);BSC/3in1    Recommendations for Other Services       Precautions / Restrictions Precautions Precautions: Fall;Sternal;Other (comment) Precaution Comments: Watch SpO2 (difficult getting reliable pulse ox reading)     Mobility  Bed Mobility Overal bed mobility: Modified Independent             General bed mobility comments: Return to supine with bed flat, cues for log roll, pt with preference to hold lungs pillow    Transfers Overall transfer level: Needs assistance Equipment used: Rollator (4 wheels) Transfers: Sit to/from Stand             General transfer comment: Multiple sit<>stands from recliner and EOB, pt with good  awareness of maintaining sternal precautions but initially focused on wanting to hold lungs pillow; able to stand on 2nd trial with momentum while holding pillow; educ on technique hands on knees with improved ability to achieve upright. Cues for locking/unlocking rollator brakes    Ambulation/Gait Ambulation/Gait assistance: Min guard;Supervision Gait Distance (Feet): 420 Feet Assistive device: Rollator (4 wheels) Gait Pattern/deviations: Step-through pattern;Decreased stride length Gait velocity: Decreased     General Gait Details: Slow, steady, fatigued gait with rollator and intermittent min guard for balance; multiple, brief standing rest breaks secondary to fatigue and SOB. Cues for pursed lip breathing and activity pacing   Stairs             Wheelchair Mobility    Modified Rankin (Stroke Patients Only)       Balance Overall balance assessment: Needs assistance   Sitting balance-Leahy Scale: Good     Standing balance support: No upper extremity supported;During functional activity Standing balance-Leahy Scale: Fair Standing balance comment: Can stand to void in urinal without UE support; static and dynamic stability improved with rollator                            Cognition Arousal/Alertness: Awake/alert Behavior During Therapy: WFL for tasks assessed/performed Overall Cognitive Status: Within Functional Limits for tasks assessed                                 General Comments: WFL for simple tasks, not formally assessed        Exercises      General Comments General comments (skin  integrity, edema, etc.): At beginning of session, pt reports, "my doctor told me I could maybe go to rehab since my wife can't help me at home." Educ SNF qualifications related to insurance and mobility needs; pt does not necessarily need for physical assist reasons      Pertinent Vitals/Pain Pain Assessment: Faces Faces Pain Scale: Hurts a little  bit Pain Location: chest Pain Descriptors / Indicators: Sore Pain Intervention(s): Monitored during session    Home Living                          Prior Function            PT Goals (current goals can now be found in the care plan section) Progress towards PT goals: Progressing toward goals    Frequency    Min 3X/week      PT Plan Discharge plan needs to be updated    Co-evaluation              AM-PAC PT "6 Clicks" Mobility   Outcome Measure  Help needed turning from your back to your side while in a flat bed without using bedrails?: None Help needed moving from lying on your back to sitting on the side of a flat bed without using bedrails?: A Little Help needed moving to and from a bed to a chair (including a wheelchair)?: A Little Help needed standing up from a chair using your arms (e.g., wheelchair or bedside chair)?: A Little Help needed to walk in hospital room?: A Little Help needed climbing 3-5 steps with a railing? : A Little 6 Click Score: 19    End of Session   Activity Tolerance: Patient tolerated treatment well Patient left: in bed;with call bell/phone within reach;with bed alarm set Nurse Communication: Mobility status PT Visit Diagnosis: Other abnormalities of gait and mobility (R26.89);Difficulty in walking, not elsewhere classified (R26.2)     Time: 8786-7672 PT Time Calculation (min) (ACUTE ONLY): 28 min  Charges:  $Therapeutic Exercise: 8-22 mins $Therapeutic Activity: 8-22 mins                     Ina Homes, PT, DPT Acute Rehabilitation Services  Pager [phone] Office [phone]  Malachy Chamber 11/18/2021, 5:07 PM

## 2021-11-18 NOTE — Progress Notes (Signed)
CARDIAC REHAB PHASE I   PRE:  Rate/Rhythm: 104 ST  BP:  Sitting: 153/88      SaO2: 95 3L  MODE:  Ambulation: 150 ft   POST:  Rate/Rhythm: 126 ST  BP:  Sitting: 133/87    SaO2: 90 2L   Pt agreeable to ambulate. Pt ax1 to EOB. Pt c/o some dizziness but wanted to continue to ambulate. Pt ambulated 150ft in hallway with slow gait, with one short standing rest break. Pt returned to recliner. Stressed importance of IS use and ambulation. Will continue to follow.  [id number] Reynold Bowen, RN BSN 11/18/2021 11:41 AM

## 2021-11-18 NOTE — Progress Notes (Signed)
Progress Note  Patient Name: LEANDRO BERKOWITZ Date of Encounter: 11/18/2021  [facility] Cardiologist: Dina Rich, MD   Subjective   Sitting in his chair in no distress. He is on O2 still 3L. BP normal to minimal hypertension.     Crt 1.25, WBC 10, Hgb 7.3  POD 5 CORONARY ARTERY BYPASS GRAFTING (CABG) X TWO, USING LEFT INTERNAL MAMMARY ARTERY AND LEFT LEG GREATER SAPHENOUS VEIN - OPEN HARVEST (N/A) TRANSESOPHAGEAL ECHOCARDIOGRAM (TEE) APPLICATION OF CELL SAVER  Cxray 11/16/2021 Small BL pleural effusions and bibasilar atelectasis  Inpatient Medications    Scheduled Meds:  aspirin  325 mg Oral Daily   bisacodyl  10 mg Oral Daily   Or   bisacodyl  10 mg Rectal Daily   Chlorhexidine Gluconate Cloth  6 each Topical Daily   docusate sodium  200 mg Oral BID   ferrous fumarate-b12-vitamic C-folic acid  1 capsule Oral BID PC   mouth rinse  15 mL Mouth Rinse BID   metoprolol tartrate  37.5 mg Oral BID   pantoprazole  40 mg Oral Daily   polyethylene glycol  17 g Oral Daily   rosuvastatin  40 mg Oral Q2000   Continuous Infusions:   PRN Meds: morphine injection, ondansetron (ZOFRAN) IV, oxyCODONE, traMADol   Vital Signs    Vitals:   11/17/21 2355 11/18/21 0000 11/18/21 0444 11/18/21 0737  BP:   (!) 133/93 (!) 141/90  Pulse:   99 99  Resp: 20 18 20 18   Temp:   98.4 F (36.9 C) 98.5 F (36.9 C)  TempSrc:   Oral Oral  SpO2:   98% 96%  Weight:   89.8 kg   Height:        Intake/Output Summary (Last 24 hours) at 11/18/2021 1011 Last data filed at 11/18/2021 0941 Gross per 24 hour  Intake 950 ml  Output 850 ml  Net 100 ml   Last 3 Weights 11/18/2021 11/17/2021 11/16/2021  Weight (lbs) 197 lb 15.6 oz 195 lb 12.3 oz 197 lb 8.5 oz  Weight (kg) 89.8 kg 88.8 kg 89.6 kg      Telemetry   Bouts of atrial tachycardia, sinus tachycardia, sinus rhythm - Personally Reviewed  ECG    NA-Personally Reviewed  Physical Exam   VS:  BP (!) 141/90 (BP  Location: Right Arm)   Pulse 99   Temp 98.5 F (36.9 C) (Oral)   Resp 18   Ht 6' (1.829 m)   Wt 89.8 kg   SpO2 96%   BMI 26.85 kg/m  , BMI Body mass index is 26.85 kg/m.  Physical Exam Gen: thin, elderly gentleman  Neuro: alert and oriented CV: r,r,r no murmurs. No JVD. Bandage over right IJ/carotid, clean and dry Vasc: 2+ radial pulses Pulm: no increased wob, decreased breath sounds at the bases Abd: non distended Ext: No LE edema Skin: warm and well perfused Psych: normal mood    Labs    High Sensitivity Troponin:   Recent Labs  Lab 11/11/21 1308 11/11/21 1541 11/12/21 0149 11/12/21 0602  TROPONINIHS 13 77* 3,676* 4,175*     Chemistry Recent Labs  Lab 11/12/21 0149 11/12/21 2112 11/13/21 0510 11/13/21 0511 11/13/21 1610 11/13/21 1919 11/14/21 0445 11/14/21 0633 11/15/21 0315 11/16/21 0120 11/18/21 0054  NA 136   < > 139   < > 139   < > 142   < > 138 140 141  K 3.0*   < > 4.6   < > 4.4   < >  4.1   < > 3.9 4.1 3.9  CL 104   < > 110  --  110   < > 115*  --  108 108 109  CO2 24  --  23  --  22   < > 23  --  23 27 28   GLUCOSE 127*   < > 108*  --  168*   < > 137*  --  102* 94 108*  BUN 13   < > 11  --  16   < > 16  --  17 19 14   CREATININE 1.04   < > 1.20  --  1.52*   < > 1.39*  --  1.40* 1.13 1.25*  CALCIUM 9.2  --  7.5*  --  7.5*   < > 7.9*  --  8.1* 7.9* 8.0*  MG 2.1  --  2.8*  --  3.1*  --  2.6*  --   --   --   --   PROT 6.5  --   --   --   --   --   --   --   --   --   --   ALBUMIN 3.7  --   --   --   --   --   --   --   --   --   --   AST 34  --   --   --   --   --   --   --   --   --   --   ALT 17  --   --   --   --   --   --   --   --   --   --   ALKPHOS 76  --   --   --   --   --   --   --   --   --   --   BILITOT 0.6  --   --   --   --   --   --   --   --   --   --   GFRNONAA >60  --  >60  --  50*   < > 55*  --  55* >60 >60  ANIONGAP 8  --  6  --  7   < > 4*  --  7 5 4*   < > = values in this interval not displayed.    Lipids  Recent Labs   Lab 11/14/21 0445  CHOL 46  TRIG 49  HDL 15*  LDLCALC 21  CHOLHDL 3.1    Hematology Recent Labs  Lab 11/15/21 0315 11/16/21 0120 11/18/21 0054  WBC 15.4* 16.8* 10.3  RBC 2.98* 2.85* 2.55*  HGB 8.5* 8.6* 7.3*  HCT 25.9* 25.5* 23.0*  MCV 86.9 89.5 90.2  MCH 28.5 30.2 28.6  MCHC 32.8 33.7 31.7  RDW 14.6 14.8 14.6  PLT 122* 152 211   Thyroid No results for input(s): TSH, FREET4 in the last 168 hours.  BNPNo results for input(s): BNP, PROBNP in the last 168 hours.  DDimer No results for input(s): DDIMER in the last 168 hours.   Radiology    No results found.  Cardiac Studies   Intraop TEE:  POST-OP IMPRESSIONS  _ Left Ventricle: The left ventricle is unchanged from pre-bypass.  _ Right Ventricle: The right ventricle appears unchanged from pre-bypass.  _ Aorta: The aorta appears unchanged from pre-bypass.  _  Left Atrial Appendage: The left atrial appendage appears unchanged from  pre-bypass.  _ Aortic Valve: The aortic valve appears unchanged from pre-bypass.  _ Mitral Valve: There is mild regurgitation.  _ Tricuspid Valve: The tricuspid valve appears unchanged from pre-bypass.  _ Pulmonic Valve: The pulmonic valve appears unchanged from pre-bypass.  _ Comments: No significant pericardial effusion.   PRE-OP FINDINGS   Left Ventricle: The left ventricle has hyperdynamic systolic function,  with an ejection fraction of >65%. The cavity size was decreased. There is  severe concentric left ventricular hypertrophy. Left ventricular diastolic  function could not be evaluated.   Patient Profile     70 y.o. male with hyperlipidemia admitted with NSTEMI.  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  He had a dissected left main  and required urgent bypass with LIMA to LAD and SVG to RPDA.  - continue  crestor 40 mg daily - continue metop tartrate 37.5 mg BID - cont asa   #Tachycardia:  He has bouts of AT and sinus tachycardia. Down to sinus rhythm - post-op, pain control - no signs of infection -he's dry, encourage PO/fluid intake  #Syncope: Patient had episodes of syncope prior to admission. No evidence of persistent VT/VF. No bradyarrhythmia.   #Hypertension: - good control, home lisinopril-HCTz held. Can hold until closer to discharge or as an outpatient as long as renal fxn returns to baseline around crt 1 - cont BB  #Tobacco abuse: Tobacco cessation counseling    For questions or updates, please contact [facility] Please consult [email or web address] for contact info under        Signed, Maisie Fus, MD  11/18/2021, 10:11 AM

## 2021-11-19 ENCOUNTER — Inpatient Hospital Stay (HOSPITAL_COMMUNITY): Payer: Medicare HMO

## 2021-11-19 MED ORDER — ACETAMINOPHEN 325 MG PO TABS
650.0000 mg | ORAL_TABLET | Freq: Four times a day (QID) | ORAL | Status: DC | PRN
  Administered 2021-11-21: 650 mg via ORAL
  Filled 2021-11-19: qty 2

## 2021-11-19 MED ORDER — FUROSEMIDE 10 MG/ML IJ SOLN
40.0000 mg | Freq: Once | INTRAMUSCULAR | Status: AC
  Administered 2021-11-19: 40 mg via INTRAVENOUS
  Filled 2021-11-19: qty 4

## 2021-11-19 MED ORDER — POTASSIUM CHLORIDE CRYS ER 10 MEQ PO TBCR
10.0000 meq | EXTENDED_RELEASE_TABLET | Freq: Once | ORAL | Status: AC
  Administered 2021-11-19: 10 meq via ORAL
  Filled 2021-11-19: qty 1

## 2021-11-19 MED ORDER — METOPROLOL TARTRATE 50 MG PO TABS
50.0000 mg | ORAL_TABLET | Freq: Two times a day (BID) | ORAL | Status: DC
  Administered 2021-11-19 (×2): 50 mg via ORAL
  Filled 2021-11-19 (×2): qty 1

## 2021-11-19 NOTE — Progress Notes (Addendum)
      [address]       [place] [address]             [phone]      7 Days Post-Op Procedure(s) (LRB): CORONARY ARTERY BYPASS GRAFTING (CABG) X TWO, USING LEFT INTERNAL MAMMARY ARTERY AND LEFT LEG GREATER SAPHENOUS VEIN - OPEN HARVEST (N/A) TRANSESOPHAGEAL ECHOCARDIOGRAM (TEE) APPLICATION OF CELL SAVER Subjective: + DOE  Objective: Vital signs in last 24 hours: Temp:  [97.6 F (36.4 C)-100.8 F (38.2 C)] 97.9 F (36.6 C) (11/30 0424) Pulse Rate:  [98-114] 98 (11/30 0424) Cardiac Rhythm: Sinus tachycardia (11/29 2045) Resp:  [16-22] 22 (11/30 0424) BP: (134-156)/(84-100) 150/100 (11/30 0424) SpO2:  [91 %-97 %] 97 % (11/30 0424)  Hemodynamic parameters for last 24 hours:    Intake/Output from previous day: 11/29 0701 - 11/30 0700 In: 360 [P.O.:360] Out: 725 [Urine:725] Intake/Output this shift: No intake/output data recorded.  General appearance: alert, cooperative, and no distress Heart: regular rate and rhythm Lungs: dim in bases Abdomen: benign Extremities: no edema Wound: incis healing well  Lab Results: Recent Labs    11/18/21 0054  WBC 10.3  HGB 7.3*  HCT 23.0*  PLT 211   BMET:  Recent Labs    11/18/21 0054  NA 141  K 3.9  CL 109  CO2 28  GLUCOSE 108*  BUN 14  CREATININE 1.25*  CALCIUM 8.0*    PT/INR: No results for input(s): LABPROT, INR in the last 72 hours. ABG    Component Value Date/Time   PHART 7.389 11/14/2021 1059   HCO3 22.4 11/14/2021 1059   TCO2 24 11/14/2021 1059   ACIDBASEDEF 2.0 11/14/2021 1059   O2SAT 99.0 11/14/2021 1059   CBG (last 3)  No results for input(s): GLUCAP in the last 72 hours.  Meds Scheduled Meds:  aspirin  325 mg Oral Daily   bisacodyl  10 mg Oral Daily   Or   bisacodyl  10 mg Rectal Daily   Chlorhexidine Gluconate Cloth  6 each Topical Daily   docusate sodium  200 mg Oral BID   ferrous fumarate-b12-vitamic C-folic acid  1 capsule Oral BID PC   mouth rinse  15 mL Mouth Rinse  BID   metoprolol tartrate  50 mg Oral BID   pantoprazole  40 mg Oral Daily   polyethylene glycol  17 g Oral Daily   rosuvastatin  40 mg Oral Q2000   Continuous Infusions: PRN Meds:.morphine injection, ondansetron (ZOFRAN) IV, oxyCODONE, traMADol  Xrays No results found.  Assessment/Plan: S/P Procedure(s) (LRB): CORONARY ARTERY BYPASS GRAFTING (CABG) X TWO, USING LEFT INTERNAL MAMMARY ARTERY AND LEFT LEG GREATER SAPHENOUS VEIN - OPEN HARVEST (N/A) TRANSESOPHAGEAL ECHOCARDIOGRAM (TEE) APPLICATION OF CELL SAVER  1 Tmax 100.8 ,sBP 130's-150's- may need additional agent, sinus rhythm 2 sats ok on 3 liters, + DOE 3 weight approx preop, uncertain if UOP is accurate 4 no new labs- repeat in am 5 CXR bilat effus, left>R, some vasc congestion, may need to consider further diuresis- will d/w MD 6 push rehab/pulm toiilet as able   LOS: 7 days    Rowe Clack PA-C Pager [phone] 11/19/2021    Agree with above. Continue pulmonary toilet And dispo planning.  Detria Cummings Keane Scrape

## 2021-11-19 NOTE — Progress Notes (Addendum)
Progress Note  Patient Name: Dean Mendez Date of Encounter: 11/19/2021  [facility] Cardiologist: Carlyle Dolly, MD   Subjective   Sitting in his chair in no distress. He is on O2 still 3L. BP normal to minimal hypertension.   Sinus tachycardia. 3L O2 Low grade temp overnight Labs pending  POD 5 CORONARY ARTERY BYPASS GRAFTING (CABG) X TWO, USING LEFT INTERNAL MAMMARY ARTERY AND LEFT LEG GREATER SAPHENOUS VEIN - OPEN HARVEST (N/A) TRANSESOPHAGEAL ECHOCARDIOGRAM (TEE) APPLICATION OF CELL SAVER  Cxray 11/30 Persitent small BL pleural effusion and atelectasis, no significant changes  Cxray 11/16/2021 Small BL pleural effusions and bibasilar atelectasis  Inpatient Medications    Scheduled Meds:  aspirin  325 mg Oral Daily   bisacodyl  10 mg Oral Daily   Or   bisacodyl  10 mg Rectal Daily   Chlorhexidine Gluconate Cloth  6 each Topical Daily   docusate sodium  200 mg Oral BID   ferrous Q000111Q C-folic acid  1 capsule Oral BID PC   mouth rinse  15 mL Mouth Rinse BID   metoprolol tartrate  50 mg Oral BID   pantoprazole  40 mg Oral Daily   polyethylene glycol  17 g Oral Daily   rosuvastatin  40 mg Oral Q2000   Continuous Infusions:   PRN Meds: acetaminophen, morphine injection, ondansetron (ZOFRAN) IV, oxyCODONE, traMADol   Vital Signs    Vitals:   11/18/21 2358 11/19/21 0424 11/19/21 0827 11/19/21 0833  BP: (!) 145/92 (!) 150/100 (!) 159/91   Pulse: 99 98 (!) 103 (!) 107  Resp: 20 (!) 22 (!) 24 (!) 22  Temp: 99 F (37.2 C) 97.9 F (36.6 C) 98.8 F (37.1 C)   TempSrc: Oral Oral Oral   SpO2: 97% 97% 95% 96%  Weight:      Height:        Intake/Output Summary (Last 24 hours) at 11/19/2021 0924 Last data filed at 11/19/2021 0658 Gross per 24 hour  Intake 360 ml  Output 725 ml  Net -365 ml   Last 3 Weights 11/18/2021 11/17/2021 11/16/2021  Weight (lbs) 197 lb 15.6 oz 195 lb 12.3 oz 197 lb 8.5 oz  Weight (kg) 89.8 kg 88.8 kg  89.6 kg      Telemetry  Sinus tachycardia- Personally Reviewed  ECG    NA-Personally Reviewed  Physical Exam   VS:  BP (!) 159/91 (BP Location: Left Arm)   Pulse (!) 107   Temp 98.8 F (37.1 C) (Oral)   Resp (!) 22   Ht 6' (1.829 m)   Wt 89.8 kg   SpO2 96%   BMI 26.85 kg/m  , BMI Body mass index is 26.85 kg/m.  Physical Exam Gen: thin, elderly gentleman, sitting up in bed, mildly fatigued Neuro: alert and oriented CV: r,r,r no murmurs. No JVD. Bandage over right IJ/carotid, clean and dry Vasc: 2+ radial pulses Pulm: no increased wob, decreased breath sounds at the bases Abd: non distended Ext: No LE edema Skin: warm and well perfused Psych: normal mood    Labs    High Sensitivity Troponin:   Recent Labs  Lab 11/11/21 1308 11/11/21 1541 11/12/21 0149 11/12/21 0602  TROPONINIHS 13 77* 3,676* 4,175*     Chemistry Recent Labs  Lab 11/13/21 0510 11/13/21 0511 11/13/21 1610 11/13/21 1919 11/14/21 0445 11/14/21 0633 11/15/21 0315 11/16/21 0120 11/18/21 0054  NA 139   < > 139   < > 142   < > 138 140 141  K 4.6   < > 4.4   < > 4.1   < > 3.9 4.1 3.9  CL 110  --  110   < > 115*  --  108 108 109  CO2 23  --  22   < > 23  --  23 27 28   GLUCOSE 108*  --  168*   < > 137*  --  102* 94 108*  BUN 11  --  16   < > 16  --  17 19 14   CREATININE 1.20  --  1.52*   < > 1.39*  --  1.40* 1.13 1.25*  CALCIUM 7.5*  --  7.5*   < > 7.9*  --  8.1* 7.9* 8.0*  MG 2.8*  --  3.1*  --  2.6*  --   --   --   --   GFRNONAA >60  --  50*   < > 55*  --  55* >60 >60  ANIONGAP 6  --  7   < > 4*  --  7 5 4*   < > = values in this interval not displayed.    Lipids  Recent Labs  Lab 11/14/21 0445  CHOL 46  TRIG 49  HDL 15*  LDLCALC 21  CHOLHDL 3.1    Hematology Recent Labs  Lab 11/15/21 0315 11/16/21 0120 11/18/21 0054  WBC 15.4* 16.8* 10.3  RBC 2.98* 2.85* 2.55*  HGB 8.5* 8.6* 7.3*  HCT 25.9* 25.5* 23.0*  MCV 86.9 89.5 90.2  MCH 28.5 30.2 28.6  MCHC 32.8 33.7 31.7   RDW 14.6 14.8 14.6  PLT 122* 152 211   Thyroid No results for input(s): TSH, FREET4 in the last 168 hours.  BNPNo results for input(s): BNP, PROBNP in the last 168 hours.  DDimer No results for input(s): DDIMER in the last 168 hours.   Radiology    DG Chest 2 View  Result Date: 11/19/2021 CLINICAL DATA:  Follow-up exam EXAM: CHEST - 2 VIEW COMPARISON:  11/16/2021 FINDINGS: Right IJ central line is no longer present. Persistent small bilateral pleural effusions and bibasilar atelectasis. No pneumothorax. Background COPD. Cardiomegaly. IMPRESSION: Persistent small bilateral pleural effusions and bibasilar atelectasis. Electronically Signed   By: 11/21/2021 M.D.   On: 11/19/2021 08:50    Cardiac Studies   Intraop TEE:  POST-OP IMPRESSIONS  _ Left Ventricle: The left ventricle is unchanged from pre-bypass.  _ Right Ventricle: The right ventricle appears unchanged from pre-bypass.  _ Aorta: The aorta appears unchanged from pre-bypass.  _ Left Atrial Appendage: The left atrial appendage appears unchanged from  pre-bypass.  _ Aortic Valve: The aortic valve appears unchanged from pre-bypass.  _ Mitral Valve: There is mild regurgitation.  _ Tricuspid Valve: The tricuspid valve appears unchanged from pre-bypass.  _ Pulmonic Valve: The pulmonic valve appears unchanged from pre-bypass.  _ Comments: No significant pericardial effusion.   PRE-OP FINDINGS   Left Ventricle: The left ventricle has hyperdynamic systolic function,  with an ejection fraction of >65%. The cavity size was decreased. There is  severe concentric left ventricular hypertrophy. Left ventricular diastolic  function could not be evaluated.   Patient Profile     70 y.o. male with hyperlipidemia admitted with NSTEMI.  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  He had a dissected left main and required urgent bypass with LIMA to LAD and SVG to RPDA.  - continue  crestor 40 mg daily - on metop - on asa  #Syncope: Patient had episodes of syncope prior to admission. No evidence of persistent VT/VF. No bradyarrhythmia.   #Hypertension: - good control, home lisinopril-HCTz held. Can hold until closer to discharge or as an outpatient as long as renal fxn returns to baseline around crt 1 - cont BB  #Tobacco abuse: Tobacco cessation counseling  General cardiology will plan to sign off for now, please let us know when he is closer to discharge if you would like [place] to re-engage.  For questions or updates, please contact [place] Please consult [email or web address] for contact info under        Signed, Janina Mayo, MD  11/19/2021, 9:24 AM

## 2021-11-19 NOTE — Progress Notes (Signed)
CARDIAC REHAB PHASE I   PRE:  Rate/Rhythm: 102 ST  BP:  Sitting: 138/88      SaO2: 97 3L --> 89 RA  MODE:  Ambulation: 450 ft   POST:  Rate/Rhythm: 131 ST  BP:  Sitting: 122/77    SaO2: 90 2L   Pt agreeable to ambulate. Pt ambulated 430ft in hallway standby assist with rollator. Pt took one seated rest break c/o SOB. Pt coached through purse lipped breathing. Pt returned to recliner. Encouraged continued IS use and ambulation. Will continue to follow.  [id number] Reynold Bowen, RN BSN 11/19/2021 11:41 AM

## 2021-11-19 NOTE — Progress Notes (Signed)
Mobility Specialist: Progress Note   11/19/21 1517  Mobility  Activity Ambulated in hall  Level of Assistance Standby assist, set-up cues, supervision of patient - no hands on  Assistive Device Four wheel walker  Distance Ambulated (ft) 430 ft (255'+175')  Mobility Ambulated with assistance in hallway  Mobility Response Tolerated well  Mobility performed by Mobility specialist  Bed Position Chair  $Mobility charge 1 Mobility   Pre-Mobility on 2.5 L/min [place]: 112 HR, 98% SpO2 Post-Mobility on 2.5 L/min [place]: 115 HR  Pt stopped for one seated break d/t fatigue and feeling light headed lasting 1-2 minutes. Pt back to recliner after walk with NT present in the room.   [facility] Danny Zimny Mobility Specialist Mobility Specialist Phone #1: [phone] Mobility Specialist Phone #2: [phone]

## 2021-11-20 LAB — URINALYSIS, COMPLETE (UACMP) WITH MICROSCOPIC
Bilirubin Urine: NEGATIVE
Glucose, UA: NEGATIVE mg/dL
Ketones, ur: NEGATIVE mg/dL
Leukocytes,Ua: NEGATIVE
Nitrite: NEGATIVE
Protein, ur: NEGATIVE mg/dL
Specific Gravity, Urine: 1.02 (ref 1.005–1.030)
pH: 7 (ref 5.0–8.0)

## 2021-11-20 LAB — BASIC METABOLIC PANEL
Anion gap: 7 (ref 5–15)
BUN: 9 mg/dL (ref 8–23)
CO2: 29 mmol/L (ref 22–32)
Calcium: 8.5 mg/dL — ABNORMAL LOW (ref 8.9–10.3)
Chloride: 107 mmol/L (ref 98–111)
Creatinine, Ser: 1.12 mg/dL (ref 0.61–1.24)
GFR, Estimated: >60 mL/min (ref >60)
Glucose, Bld: 113 mg/dL — ABNORMAL HIGH (ref 70–99)
Potassium: 3.5 mmol/L (ref 3.5–5.1)
Sodium: 143 mmol/L (ref 135–145)

## 2021-11-20 LAB — CBC
HCT: 25.1 % — ABNORMAL LOW (ref 39.0–52.0)
Hemoglobin: 8 g/dL — ABNORMAL LOW (ref 13.0–17.0)
MCH: 28.7 pg (ref 26.0–34.0)
MCHC: 31.9 g/dL (ref 30.0–36.0)
MCV: 90 fL (ref 80.0–100.0)
Platelets: 352 10*3/uL (ref 150–400)
RBC: 2.79 MIL/uL — ABNORMAL LOW (ref 4.22–5.81)
RDW: 15.3 % (ref 11.5–15.5)
WBC: 13.2 10*3/uL — ABNORMAL HIGH (ref 4.0–10.5)
nRBC: 1 % — ABNORMAL HIGH (ref 0.0–0.2)

## 2021-11-20 MED ORDER — POTASSIUM CHLORIDE CRYS ER 20 MEQ PO TBCR
40.0000 meq | EXTENDED_RELEASE_TABLET | Freq: Every day | ORAL | Status: DC
Start: 2021-11-20 — End: 2021-11-22
  Administered 2021-11-20 – 2021-11-22 (×3): 40 meq via ORAL
  Filled 2021-11-20 (×3): qty 2

## 2021-11-20 MED ORDER — METOPROLOL TARTRATE 25 MG PO TABS
37.5000 mg | ORAL_TABLET | Freq: Three times a day (TID) | ORAL | Status: DC
  Administered 2021-11-20 – 2021-11-22 (×7): 37.5 mg via ORAL
  Filled 2021-11-20 (×7): qty 1

## 2021-11-20 MED ORDER — FUROSEMIDE 40 MG PO TABS
40.0000 mg | ORAL_TABLET | Freq: Every day | ORAL | Status: DC
  Administered 2021-11-20 – 2021-11-22 (×3): 40 mg via ORAL
  Filled 2021-11-20 (×3): qty 1

## 2021-11-20 MED FILL — Heparin Sodium (Porcine) Inj 1000 Unit/ML: Qty: 1000 | Status: AC

## 2021-11-20 MED FILL — Lidocaine HCl Local Preservative Free (PF) Inj 2%: INTRAMUSCULAR | Qty: 15 | Status: AC

## 2021-11-20 MED FILL — Potassium Chloride Inj 2 mEq/ML: INTRAVENOUS | Qty: 40 | Status: AC

## 2021-11-20 NOTE — Progress Notes (Signed)
SATURATION QUALIFICATIONS: (This note is used to comply with regulatory documentation for home oxygen)  Patient Saturations on Room Air at Rest = 94%  Patient Saturations on Room Air while Ambulating = 86%  Patient Saturations on 2 Liters of oxygen while Ambulating = 92%  Please briefly explain why patient needs home oxygen: Yes pt needs oxygen to maintain sats >90% when ambulating.

## 2021-11-20 NOTE — Progress Notes (Signed)
[address]       [place] [address]             [phone]      8 Days Post-Op Procedure(s) (LRB): CORONARY ARTERY BYPASS GRAFTING (CABG) X TWO, USING LEFT INTERNAL MAMMARY ARTERY AND LEFT LEG GREATER SAPHENOUS VEIN - OPEN HARVEST (N/A) TRANSESOPHAGEAL ECHOCARDIOGRAM (TEE) APPLICATION OF CELL SAVER Subjective: Breathing a little less DOE with ambulation  Objective: Vital signs in last 24 hours: Temp:  [98.1 F (36.7 C)-99.5 F (37.5 C)] 99.1 F (37.3 C) (12/01 0404) Pulse Rate:  [100-110] 100 (12/01 0404) Cardiac Rhythm: Normal sinus rhythm;Sinus tachycardia (11/30 2000) Resp:  [20-24] 20 (12/01 0404) BP: (128-159)/(77-94) 141/94 (12/01 0404) SpO2:  [92 %-99 %] 97 % (12/01 0404) Weight:  [85.4 kg] 85.4 kg (12/01 0404)  Hemodynamic parameters for last 24 hours:    Intake/Output from previous day: 11/30 0701 - 12/01 0700 In: 480 [P.O.:480] Out: 2200 [Urine:2200] Intake/Output this shift: No intake/output data recorded.  General appearance: alert, cooperative, and no distress Heart: regular rate and rhythm Lungs: dim in bases Abdomen: benign Extremities: + LE edema Wound: incis healing well  Lab Results: Recent Labs    11/18/21 0054 11/20/21 0313  WBC 10.3 13.2*  HGB 7.3* 8.0*  HCT 23.0* 25.1*  PLT 211 352   BMET:  Recent Labs    11/18/21 0054 11/20/21 0313  NA 141 143  K 3.9 3.5  CL 109 107  CO2 28 29  GLUCOSE 108* 113*  BUN 14 9  CREATININE 1.25* 1.12  CALCIUM 8.0* 8.5*    PT/INR: No results for input(s): LABPROT, INR in the last 72 hours. ABG    Component Value Date/Time   PHART 7.389 11/14/2021 1059   HCO3 22.4 11/14/2021 1059   TCO2 24 11/14/2021 1059   ACIDBASEDEF 2.0 11/14/2021 1059   O2SAT 99.0 11/14/2021 1059   CBG (last 3)  No results for input(s): GLUCAP in the last 72 hours.  Meds Scheduled Meds:  aspirin  325 mg Oral Daily   bisacodyl  10 mg Oral Daily   Or   bisacodyl  10 mg Rectal Daily    docusate sodium  200 mg Oral BID   ferrous Q000111Q C-folic acid  1 capsule Oral BID PC   mouth rinse  15 mL Mouth Rinse BID   metoprolol tartrate  50 mg Oral BID   pantoprazole  40 mg Oral Daily   polyethylene glycol  17 g Oral Daily   rosuvastatin  40 mg Oral Q2000   Continuous Infusions: PRN Meds:.acetaminophen, morphine injection, ondansetron (ZOFRAN) IV, oxyCODONE, traMADol  Xrays DG Chest 2 View  Result Date: 11/19/2021 CLINICAL DATA:  Follow-up exam EXAM: CHEST - 2 VIEW COMPARISON:  11/16/2021 FINDINGS: Right IJ central line is no longer present. Persistent small bilateral pleural effusions and bibasilar atelectasis. No pneumothorax. Background COPD. Cardiomegaly. IMPRESSION: Persistent small bilateral pleural effusions and bibasilar atelectasis. Electronically Signed   By: Macy Mis M.D.   On: 11/19/2021 08:50    Assessment/Plan: S/P Procedure(s) (LRB): CORONARY ARTERY BYPASS GRAFTING (CABG) X TWO, USING LEFT INTERNAL MAMMARY ARTERY AND LEFT LEG GREATER SAPHENOUS VEIN - OPEN HARVEST (N/A) TRANSESOPHAGEAL ECHOCARDIOGRAM (TEE) APPLICATION OF CELL SAVER    1 Tmax 100, VSS sinus tachy 100-110 , S BP 120's-150's 2 sats good now  on 2.5 liters 3 good response to IV lasix yesterday- creat improved to 1.12- still edematous, small effus- will cont to diurese for now 4  replace K+ for 3.5 5 increase leukocytosis trend, 13.2 6 H/H improved with volume equalibration 7 BS control ok 8 push rehab/pulm hygiene as able 9 ? Plavix candidate 10 says he would like to pursue short term SNF , will ger TOC involved 11 check UA/C+ S    LOS: 8 days    Rowe Clack PA-C Pager [phone]  11/20/2021

## 2021-11-20 NOTE — Progress Notes (Signed)
Physical Therapy Treatment Patient Details Name: Dean Mendez MRN: [id number] DOB: [birth year] Today's Date: 11/20/2021   History of Present Illness Pt is a 70 y.o. male admitted 11/11/21 after sycnopal episode at home. Pt with NSTEMI and left main dissection; IABP insertion 11/23. S/p emergent CABGx2 on 11/24. PMH includes HTN, HLD.   PT Comments    Pt progressing with mobility. Today's session focused on strategies to maintain sternal precautions with mobility, ambulation for improving activity tolerance. Pt motivated to participate, moving well with rollator and supervision for safety. Endorses feeling tired with activity. Will continue to follow acutely to address established goals.  HR up to 141 with ambulation Difficult getting reliable pulse ox read with mobility; upon sitting, SpO2 88% on RA    Recommendations for follow up therapy are one component of a multi-disciplinary discharge planning process, led by the attending physician.  Recommendations may be updated based on patient status, additional functional criteria and insurance authorization.  Follow Up Recommendations  Home health PT     Assistance Recommended at Discharge Intermittent Supervision/Assistance  Equipment Recommendations  Rollator (4 wheels);BSC/3in1    Recommendations for Other Services       Precautions / Restrictions Precautions Precautions: Fall;Sternal;Other (comment) Precaution Comments: Watch SpO2 (difficult getting reliable pulse ox reading)     Mobility  Bed Mobility Overal bed mobility: Needs Assistance Bed Mobility: Rolling;Sidelying to Sit Rolling: Supervision Sidelying to sit: Supervision       General bed mobility comments: Able to perform log roll and sidelying to sit with bed nearly flat, no use of bed rail; cues for sequencing    Transfers Overall transfer level: Needs assistance Equipment used: Rollator (4 wheels) Transfers: Sit to/from Stand Sit to Stand:  Supervision           General transfer comment: Good carryover of sequencing to stand with hands on knees, 1x cues to lock rollator brakes prior to sitting; supervision for safety    Ambulation/Gait Ambulation/Gait assistance: Supervision Gait Distance (Feet): 420 Feet Assistive device: Rollator (4 wheels) Gait Pattern/deviations: Step-through pattern;Decreased stride length Gait velocity: Decreased     General Gait Details: Slow, steady gait with rollator and supervision for safety; noted improvements in DOE 2/4; pt reports brief bout of lightheadedness but declines seated rest break; 4x brief standing rest breaks secondary to fatigue   Stairs             Wheelchair Mobility    Modified Rankin (Stroke Patients Only)       Balance Overall balance assessment: Needs assistance   Sitting balance-Leahy Scale: Good     Standing balance support: No upper extremity supported;During functional activity Standing balance-Leahy Scale: Fair Standing balance comment: able to stand without UE suppor                            Cognition Arousal/Alertness: Awake/alert Behavior During Therapy: WFL for tasks assessed/performed Overall Cognitive Status: Within Functional Limits for tasks assessed                                          Exercises      General Comments        Pertinent Vitals/Pain Pain Assessment: Faces Faces Pain Scale: Hurts a little bit Pain Location: chest Pain Descriptors / Indicators: Sore;Tiring Pain Intervention(s): Monitored during session    Home Living  Prior Function            PT Goals (current goals can now be found in the care plan section) Progress towards PT goals: Progressing toward goals    Frequency    Min 3X/week      PT Plan Current plan remains appropriate    Co-evaluation              AM-PAC PT "6 Clicks" Mobility   Outcome Measure  Help  needed turning from your back to your side while in a flat bed without using bedrails?: A Little Help needed moving from lying on your back to sitting on the side of a flat bed without using bedrails?: A Little Help needed moving to and from a bed to a chair (including a wheelchair)?: A Little Help needed standing up from a chair using your arms (e.g., wheelchair or bedside chair)?: A Little Help needed to walk in hospital room?: A Little Help needed climbing 3-5 steps with a railing? : A Little 6 Click Score: 18    End of Session Equipment Utilized During Treatment: Gait belt Activity Tolerance: Patient tolerated treatment well Patient left: in chair;with call bell/phone within reach;with chair alarm set;with family/visitor present Nurse Communication: Mobility status PT Visit Diagnosis: Other abnormalities of gait and mobility (R26.89);Difficulty in walking, not elsewhere classified (R26.2)     Time: [id number] PT Time Calculation (min) (ACUTE ONLY): 23 min  Charges:  $Therapeutic Exercise: 8-22 mins $Therapeutic Activity: 8-22 mins                     Mabeline Caras, PT, DPT Acute Rehabilitation Services  Pager [phone] Office [phone]  Derry Lory 11/20/2021, 10:50 AM

## 2021-11-20 NOTE — Progress Notes (Signed)
Mobility Specialist: Progress Note   11/20/21 1448  Mobility  Activity Ambulated in hall  Level of Assistance Standby assist, set-up cues, supervision of patient - no hands on  Assistive Device Four wheel walker  Distance Ambulated (ft) 430 ft  Mobility Ambulated with assistance in hallway  Mobility Response Tolerated well  Mobility performed by Mobility specialist  $Mobility charge 1 Mobility   Pre-Mobility: 118 HR, 979% SpO2 During Mobility:       On RA: 105 HR, 85-86% SpO2      On 2 L/min [place]: 90-91%  Post-Mobility on 2 L/min [place]: 99 HR, 120/82 BP, 95% SpO2  Attempted to ambulate pt on RA. Pt sats 85-86% on RA. Increased O2 flow to 2 L/min [place] for pt to maintain >90% SpO2. Pt c/o 6/10 chest pain, otherwise no c/o. Pt back to bed after walk with call bell and phone at his side.   [facility] Anetria Harwick Mobility Specialist Mobility Specialist Phone #1: [phone] Mobility Specialist Phone #2: [phone]

## 2021-11-20 NOTE — TOC Initial Note (Signed)
Transition of Care (TOC) - Initial/Assessment Note  Donn Pierini RN, BSN Transitions of Care Unit 4E- RN Case Manager See Treatment Team for direct phone #    Patient Details  Name: Dean Mendez MRN: [id number] Date of Birth: 05-Sep-1951  Transition of Care [facility]) CM/SW Contact:    Darrold Span, RN Phone Number: 11/20/2021, 3:41 PM  Clinical Narrative:                 Pt from home w/ spouse s/p CABG. Referral received for potential SNF need. Cm spoke with pt at bedside- per pt he lives at home with wife who is 10 years older than him. Per pt she cooks and still drives. Pt reports however that he is worried that she can "care" for him post discharge. Per notes pt is walking 400+ feet min assist and insurance likely will not cover a SNF stay. Discussed this with pt- who voiced understanding. Discussed with pt options of friends/family/neighbors that might can assist him and his wife, as he at this time needs minimal assistance. Also discussed HH with pt for ongoing therapy- pt is agreeable to [facility]- will need order placed for HHPT- list provided for [facility] choice- pt would like to look over list first- CM to return for pt selection.   Orders in for DME- RW and 3n1- per therapy notes pt does better with rollator- will need to change DME order to rollator - pt agrees that rollator and 3n1 would be helpful for home. May also need home 02 as well- await DME order for 02 if needed.  Pt agreeable to using in house provider for DME and home 02 needs.  CM to f/u in am and call for DME as well as HH referral.     Expected Discharge Plan: Home w Home Health Services Barriers to Discharge: Continued Medical Work up   Patient Goals and CMS Choice Patient states their goals for this hospitalization and ongoing recovery are:: return home CMS Medicare.gov Compare Post Acute Care list provided to:: Patient Choice offered to / list presented to : Patient  Expected Discharge Plan and  Services Expected Discharge Plan: Home w Home Health Services   Discharge Planning Services: CM Consult Post Acute Care Choice: Durable Medical Equipment, Home Health Living arrangements for the past 2 months: Single Family Home                 DME Arranged: 3-N-1, Walker rolling with seat DME Agency: [employer] Date DME Agency Contacted: 11/21/21     HH Arranged: PT          Prior Living Arrangements/Services Living arrangements for the past 2 months: Single Family Home Lives with:: Spouse Patient language and need for interpreter reviewed:: Yes Do you feel safe going back to the place where you live?: Yes      Need for Family Participation in Patient Care: Yes (Comment) Care giver support system in place?: Yes (comment)   Criminal Activity/Legal Involvement Pertinent to Current Situation/Hospitalization: No - Comment as needed  Activities of Daily Living Home Assistive Devices/Equipment: None ADL Screening (condition at time of admission) Patient's cognitive ability adequate to safely complete daily activities?: Yes Is the patient deaf or have difficulty hearing?: No Does the patient have difficulty seeing, even when wearing glasses/contacts?: No Does the patient have difficulty concentrating, remembering, or making decisions?: No Patient able to express need for assistance with ADLs?: Yes Does the patient have difficulty dressing or bathing?: No Independently performs ADLs?:  Yes (appropriate for developmental age) Does the patient have difficulty walking or climbing stairs?: No Weakness of Legs: None Weakness of Arms/Hands: None  Permission Sought/Granted Permission sought to share information with : Facility Art therapist granted to share information with : Yes, Verbal Permission Granted     Permission granted to share info w AGENCY: DME/HH        Emotional Assessment Appearance:: Appears stated age Attitude/Demeanor/Rapport:  Engaged Affect (typically observed): Accepting, Appropriate, Pleasant Orientation: : Oriented to Self, Oriented to Place, Oriented to  Time, Oriented to Situation Alcohol / Substance Use: Not Applicable Psych Involvement: No (comment)  Admission diagnosis:  Syncope and collapse [R55] Elevated troponin [R77.8] Chest pain [R07.9] NSTEMI (non-ST elevated myocardial infarction) (HCC) [I21.4] S/P CABG x 2 [Z95.1] Patient Active Problem List   Diagnosis Date Noted   Hx of CABG 11/13/2021   NSTEMI (non-ST elevated myocardial infarction) ([place]) 11/12/2021   Dissection of left main coronary artery 11/12/2021   Chest pain 11/11/2021   Syncope 11/11/2021   Hypokalemia 11/11/2021   Elevated troponin 11/11/2021   Essential hypertension 09/25/2019   CKD (chronic kidney disease) stage 3, GFR 30-59 ml/min (HCC) 09/25/2019   Other abnormal glucose 09/25/2019   Mixed hyperlipidemia 09/25/2019   PCP:  [name], MD Pharmacy:   [place], [place] - [id number] [place] [address] Phone: [phone] Fax: [phone]     Social Determinants of Health (SDOH) Interventions    Readmission Risk Interventions No flowsheet data found.

## 2021-11-20 NOTE — Plan of Care (Signed)
  Problem: Education: Goal: Knowledge of General Education information will improve Description Including pain rating scale, medication(s)/side effects and non-pharmacologic comfort measures Outcome: Progressing   

## 2021-11-20 NOTE — Plan of Care (Signed)
  Problem: Health Behavior/Discharge Planning: Goal: Ability to manage health-related needs will improve Outcome: Progressing   Problem: Clinical Measurements: Goal: Will remain free from infection Outcome: Progressing   

## 2021-11-21 LAB — BASIC METABOLIC PANEL
Anion gap: 4 — ABNORMAL LOW (ref 5–15)
BUN: 10 mg/dL (ref 8–23)
CO2: 28 mmol/L (ref 22–32)
Calcium: 8.5 mg/dL — ABNORMAL LOW (ref 8.9–10.3)
Chloride: 109 mmol/L (ref 98–111)
Creatinine, Ser: 1.17 mg/dL (ref 0.61–1.24)
GFR, Estimated: >60 mL/min (ref >60)
Glucose, Bld: 104 mg/dL — ABNORMAL HIGH (ref 70–99)
Potassium: 3.8 mmol/L (ref 3.5–5.1)
Sodium: 141 mmol/L (ref 135–145)

## 2021-11-21 LAB — URINE CULTURE: Culture: <10000 — AB

## 2021-11-21 MED ORDER — CLOPIDOGREL BISULFATE 75 MG PO TABS
75.0000 mg | ORAL_TABLET | Freq: Every day | ORAL | Status: DC
  Administered 2021-11-21 – 2021-11-22 (×2): 75 mg via ORAL
  Filled 2021-11-21 (×2): qty 1

## 2021-11-21 MED ORDER — ENOXAPARIN SODIUM 40 MG/0.4ML IJ SOSY
40.0000 mg | PREFILLED_SYRINGE | Freq: Every day | INTRAMUSCULAR | Status: DC
  Administered 2021-11-21 – 2021-11-22 (×2): 40 mg via SUBCUTANEOUS
  Filled 2021-11-21 (×2): qty 0.4

## 2021-11-21 MED FILL — Sodium Chloride IV Soln 0.9%: INTRAVENOUS | Qty: 2000 | Status: AC

## 2021-11-21 MED FILL — Heparin Sodium (Porcine) Inj 1000 Unit/ML: INTRAMUSCULAR | Qty: 10 | Status: AC

## 2021-11-21 MED FILL — Sodium Bicarbonate IV Soln 8.4%: INTRAVENOUS | Qty: 50 | Status: AC

## 2021-11-21 MED FILL — Electrolyte-R (PH 7.4) Solution: INTRAVENOUS | Qty: 5000 | Status: AC

## 2021-11-21 MED FILL — Calcium Chloride Inj 10%: INTRAVENOUS | Qty: 10 | Status: AC

## 2021-11-21 MED FILL — Mannitol IV Soln 20%: INTRAVENOUS | Qty: 500 | Status: AC

## 2021-11-21 NOTE — Progress Notes (Signed)
Mobility Specialist: Progress Note   11/21/21 1458  Mobility  Activity Ambulated in hall  Level of Assistance Modified independent, requires aide device or extra time  Assistive Device Four wheel walker  Distance Ambulated (ft) 680 ft  Mobility Ambulated with assistance in hallway  Mobility Response Tolerated well  Mobility performed by Mobility specialist  $Mobility charge 1 Mobility   Pre-Mobility: 121 HR Post-Mobility: 110 HR  Pt ambulated on 2 L/min [place]. Pt had no c/o throughout and did not need any rest breaks this session. Pt back to bed after walk per request with call bell and phone at his side.   [facility] Dean Mendez Mobility Specialist Mobility Specialist Phone #1: [phone] Mobility Specialist Phone #2: [phone]

## 2021-11-21 NOTE — Progress Notes (Signed)
CARDIAC REHAB PHASE I   PRE:  Rate/Rhythm: 106 ST  BP:  Sitting: 134/85      SaO2: 98 2L  MODE:  Ambulation: 430 ft   POST:  Rate/Rhythm: 124 ST  BP:  Sitting: 133/89    SaO2: 91 2L  Pt ambulated 413ft in hallway standby assist with rollator. Pt took one sitting rest break c/o SOB. Pt returned to recliner. Began some d/c education. Pt given in-the-tube sheet along with heart healthy and diabetic diets. Encouraged continued ambulation and IS use. Will refer to [facility] [place]. CM aware of needs for home O2 and rollator. Will continue to follow.  [id number] Reynold Bowen, RN BSN 11/21/2021 10:07 AM

## 2021-11-21 NOTE — TOC Progression Note (Signed)
Transition of Care (TOC) - Progression Note  Donn Pierini RN, BSN Transitions of Care Unit 4E- RN Case Manager See Treatment Team for direct phone #    Patient Details  Name: Dean Mendez MRN: [id number] Date of Birth: May 08, 1951  Transition of Care [facility]) CM/SW Contact  Zenda Alpers Lenn Sink, RN Phone Number: 11/21/2021, 2:12 PM  Clinical Narrative:    Noted pt will need home 02, orders placed. Call made to [employer] for DME needs- home 02, rollator, 3n1- DME to be delivered to room today for a possible weekend discharge.   CM follow up done with pt for [facility] choice, per pt he would like to use [facility] for [facility] needs [facility] as second choice). Address, phone # and PCP all confirmed with pt in epic. Per pt his son will transport him home.   Call made to [facility] with [facility] for HHPT needs- referral has been accepted.    Expected Discharge Plan: Home w Home Health Services Barriers to Discharge: Continued Medical Work up  Expected Discharge Plan and Services Expected Discharge Plan: Home w Home Health Services   Discharge Planning Services: CM Consult Post Acute Care Choice: Durable Medical Equipment, Home Health Living arrangements for the past 2 months: Single Family Home                 DME Arranged: 3-N-1, Oxygen, Walker rolling with seat DME Agency: [employer] Date DME Agency Contacted: 11/21/21 Time DME Agency Contacted: 1130 Representative spoke with at DME Agency: Ian Malkin HH Arranged: PT HH Agency: [facility] Date [facility] Agency Contacted: 11/21/21 Time HH Agency Contacted: 1412 Representative spoke with at [facility] Agency: Kandee Keen   Social Determinants of Health (SDOH) Interventions    Readmission Risk Interventions Readmission Risk Prevention Plan 11/21/2021  Post Dischage Appt Complete  Medication Screening Complete  Transportation Screening Complete  Some recent data might be hidden

## 2021-11-21 NOTE — Progress Notes (Addendum)
[address]       [place] [address]             [phone]  9 Days Post-Op Procedure(s) (LRB): CORONARY ARTERY BYPASS GRAFTING (CABG) X TWO, USING LEFT INTERNAL MAMMARY ARTERY AND LEFT LEG GREATER SAPHENOUS VEIN - OPEN HARVEST (N/A) TRANSESOPHAGEAL ECHOCARDIOGRAM (TEE) APPLICATION OF CELL SAVER Subjective: Feels better but still moderate dyspnea with ambulation- improving with time  Objective: Vital signs in last 24 hours: Temp:  [98.3 F (36.8 C)-99.8 F (37.7 C)] 98.5 F (36.9 C) (12/02 0729) Pulse Rate:  [100-105] 101 (12/02 0729) Cardiac Rhythm: Normal sinus rhythm;Sinus tachycardia (12/01 1925) Resp:  [18-20] 18 (12/02 0729) BP: (113-148)/(75-89) 136/89 (12/02 0729) SpO2:  [93 %-97 %] 93 % (12/02 0729) Weight:  [85.3 kg] 85.3 kg (12/02 0526)  Hemodynamic parameters for last 24 hours:    Intake/Output from previous day: 12/01 0701 - 12/02 0700 In: 480 [P.O.:480] Out: 750 [Urine:750] Intake/Output this shift: Total I/O In: -  Out: 200 [Urine:200]  General appearance: alert, cooperative, and no distress Heart: regular rate and rhythm and low grade tachy Lungs: dim in bases Abdomen: benign Extremities: minimal edema Wound: incis healing well  Lab Results: Recent Labs    11/20/21 0313  WBC 13.2*  HGB 8.0*  HCT 25.1*  PLT 352   BMET:  Recent Labs    11/20/21 0313 11/21/21 0230  NA 143 141  K 3.5 3.8  CL 107 109  CO2 29 28  GLUCOSE 113* 104*  BUN 9 10  CREATININE 1.12 1.17  CALCIUM 8.5* 8.5*    PT/INR: No results for input(s): LABPROT, INR in the last 72 hours. ABG    Component Value Date/Time   PHART 7.389 11/14/2021 1059   HCO3 22.4 11/14/2021 1059   TCO2 24 11/14/2021 1059   ACIDBASEDEF 2.0 11/14/2021 1059   O2SAT 99.0 11/14/2021 1059   CBG (last 3)  No results for input(s): GLUCAP in the last 72 hours.  Meds Scheduled Meds:  aspirin  325 mg Oral Daily   bisacodyl  10 mg Oral Daily   Or   bisacodyl  10 mg  Rectal Daily   docusate sodium  200 mg Oral BID   ferrous [id number] C-folic acid  1 capsule Oral BID PC   furosemide  40 mg Oral Daily   mouth rinse  15 mL Mouth Rinse BID   metoprolol tartrate  37.5 mg Oral TID   pantoprazole  40 mg Oral Daily   polyethylene glycol  17 g Oral Daily   potassium chloride  40 mEq Oral Daily   rosuvastatin  40 mg Oral Q2000   Continuous Infusions: PRN Meds:.acetaminophen, morphine injection, ondansetron (ZOFRAN) IV, oxyCODONE, traMADol  Xrays No results found.  Assessment/Plan: S/P Procedure(s) (LRB): CORONARY ARTERY BYPASS GRAFTING (CABG) X TWO, USING LEFT INTERNAL MAMMARY ARTERY AND LEFT LEG GREATER SAPHENOUS VEIN - OPEN HARVEST (N/A) TRANSESOPHAGEAL ECHOCARDIOGRAM (TEE) APPLICATION OF CELL SAVER  1 Tmax 99.8, VSS , SR/ST- metoprolol 37.5 TID 2 O2 requirements slowly improving , has been good on 1-2 lites [place] 3 good UOP- cont diuresis 4 creat pretty stable at 1.17 5 urine trace HGB, rare bacteria 6 on trinsicon for anemia 7 will add lovenox dor DVT ppx 8 doing too well for SNF and insurance wont cover- will see if he qualifies and arrange Home O2 and poss d/c home in next couple days. Other [place] arranged 9 no obvious evid of infection- cont to monitor  temp curve 10 ? Plavix     LOS: 9 days    Rowe Clack PA-C Pager [phone] 11/21/2021  Agree with above.  Overall doing well.  Dispo planning.  Taccara Bushnell Keane Scrape

## 2021-11-21 NOTE — Care Management Important Message (Signed)
Important Message  Patient Details  Name: Dean Mendez MRN: [id number] Date of Birth: 02-Feb-1951   Medicare Important Message Given:  Yes     Renie Ora 11/21/2021, 9:35 AM

## 2021-11-22 MED ORDER — FE FUMARATE-B12-VIT C-FA-IFC PO CAPS: 1.0000 | ORAL_CAPSULE | Freq: Two times a day (BID) | ORAL | 1 refills | Status: DC

## 2021-11-22 MED ORDER — ROSUVASTATIN CALCIUM 40 MG PO TABS: 40.0000 mg | ORAL_TABLET | Freq: Every day | ORAL | 1 refills | Status: DC

## 2021-11-22 MED ORDER — METOPROLOL TARTRATE 37.5 MG PO TABS: 37.5000 mg | ORAL_TABLET | Freq: Three times a day (TID) | ORAL | 1 refills | Status: DC

## 2021-11-22 MED ORDER — CLOPIDOGREL BISULFATE 75 MG PO TABS
75.0000 mg | ORAL_TABLET | Freq: Every day | ORAL | 1 refills | Status: DC
Start: 2021-11-22 — End: 2022-01-20

## 2021-11-22 MED ORDER — TRAMADOL HCL 50 MG PO TABS: 50.0000 mg | ORAL_TABLET | Freq: Four times a day (QID) | ORAL | 0 refills | Status: AC | PRN

## 2021-11-22 MED ORDER — ACETAMINOPHEN 325 MG PO TABS: 650.0000 mg | ORAL_TABLET | Freq: Four times a day (QID) | ORAL | Status: DC | PRN

## 2021-11-22 NOTE — Progress Notes (Signed)
      [address]       [place] [address]             [phone]      10 Days Post-Op Procedure(s) (LRB): CORONARY ARTERY BYPASS GRAFTING (CABG) X TWO, USING LEFT INTERNAL MAMMARY ARTERY AND LEFT LEG GREATER SAPHENOUS VEIN - OPEN HARVEST (N/A) TRANSESOPHAGEAL ECHOCARDIOGRAM (TEE) APPLICATION OF CELL SAVER Subjective: Feels pretty well, gaining strength  Objective: Vital signs in last 24 hours: Temp:  [98.3 F (36.8 C)-99.9 F (37.7 C)] 98.3 F (36.8 C) (12/03 0400) Pulse Rate:  [93-103] 96 (12/03 0400) Cardiac Rhythm: Normal sinus rhythm;Sinus tachycardia (12/02 1915) Resp:  [19-23] 20 (12/03 0400) BP: (117-137)/(71-87) 130/87 (12/03 0400) SpO2:  [93 %-97 %] 93 % (12/03 0400)  Hemodynamic parameters for last 24 hours:    Intake/Output from previous day: 12/02 0701 - 12/03 0700 In: 480 [P.O.:480] Out: 1050 [Urine:1050] Intake/Output this shift: No intake/output data recorded.  General appearance: alert, cooperative, and no distress Heart: regular rate and rhythm Lungs: mildly dim in bases Abdomen: benign Extremities: no edema Wound: incis healing well  Lab Results: Recent Labs    11/20/21 0313  WBC 13.2*  HGB 8.0*  HCT 25.1*  PLT 352   BMET:  Recent Labs    11/20/21 0313 11/21/21 0230  NA 143 141  K 3.5 3.8  CL 107 109  CO2 29 28  GLUCOSE 113* 104*  BUN 9 10  CREATININE 1.12 1.17  CALCIUM 8.5* 8.5*    PT/INR: No results for input(s): LABPROT, INR in the last 72 hours. ABG    Component Value Date/Time   PHART 7.389 11/14/2021 1059   HCO3 22.4 11/14/2021 1059   TCO2 24 11/14/2021 1059   ACIDBASEDEF 2.0 11/14/2021 1059   O2SAT 99.0 11/14/2021 1059   CBG (last 3)  No results for input(s): GLUCAP in the last 72 hours.  Meds Scheduled Meds:  aspirin  325 mg Oral Daily   bisacodyl  10 mg Oral Daily   Or   bisacodyl  10 mg Rectal Daily   clopidogrel  75 mg Oral Daily   docusate sodium  200 mg Oral BID   enoxaparin  (LOVENOX) injection  40 mg Subcutaneous Daily   ferrous fumarate-b12-vitamic C-folic acid  1 capsule Oral BID PC   furosemide  40 mg Oral Daily   mouth rinse  15 mL Mouth Rinse BID   metoprolol tartrate  37.5 mg Oral TID   pantoprazole  40 mg Oral Daily   polyethylene glycol  17 g Oral Daily   potassium chloride  40 mEq Oral Daily   rosuvastatin  40 mg Oral Q2000   Continuous Infusions: PRN Meds:.acetaminophen, morphine injection, ondansetron (ZOFRAN) IV, oxyCODONE, traMADol  Xrays No results found.  Assessment/Plan: S/P Procedure(s) (LRB): CORONARY ARTERY BYPASS GRAFTING (CABG) X TWO, USING LEFT INTERNAL MAMMARY ARTERY AND LEFT LEG GREATER SAPHENOUS VEIN - OPEN HARVEST (N/A) TRANSESOPHAGEAL ECHOCARDIOGRAM (TEE) APPLICATION OF CELL SAVER  1 Tmax 99.9, VSS, sinus rhythm, rare pvc 2 sats ok on 2 liters- home O2 arranged 3 weight now below preop- edema gone, will d/c lasix 4 no new labs 5 urine cx neg 6 stable for discharge, HHPT arranged     LOS: 10 days    Rowe Clack PA-C pager [phone] 11/22/2021

## 2021-11-22 NOTE — Progress Notes (Signed)
Mobility Specialist: Progress Note   11/22/21 1442  Mobility  Activity Ambulated to bathroom  Level of Assistance Minimal assist, patient does 75% or more  Assistive Device Four wheel walker  Distance Ambulated (ft) 24 ft  Mobility Out of bed for toileting  Mobility Response Tolerated well  Mobility performed by Mobility specialist  Bed Position Chair  $Mobility charge 1 Mobility   Pt assisted to the BR, BM successful. Pt sitting in his rollator and requesting to be assisted getting dressed for discharge, NT notified.  [facility] Sharunda Salmon Mobility Specialist Mobility Specialist Phone #1: [phone] Mobility Specialist Phone #2: [phone]

## 2021-11-22 NOTE — Progress Notes (Signed)
CARDIAC REHAB PHASE I   PRE:  Rate/Rhythm: ST / 110  BP:  Sitting: 147/85      SaO2: 96  MODE:  Ambulation: 5 ft ST/125  POST:  Rate/Rhythm: ST /115       SaO2: 92  Pt received in bed eating breakfast.. Pt agrees to ambulation and education. Pt education S/P CABG, post OHS booklet provided. Reviewed S/S to report to MD, wound care, sternal precautions. Risk factor education provided on HTN/low Na diet, heart healthy diet and medication compliance, Reviewed exercise guidelines with patient, Pt has been referred to the CRP2 program at [facility].  Pt able to stand from beside independently. Attempted to begin walk and HR increased to 125. Ambulation stopped and pt place to chair. Primary RN notified of this. Pt left OOB to chair with his tray table , call bell, suction in reach.    Lorin Picket, MS, ACSM EP-C, [facility] 11/22/2021  8:15 - 9:30

## 2021-11-22 NOTE — Progress Notes (Signed)
Mobility Specialist: Progress Note   11/22/21 1131  Mobility  Activity Ambulated in hall  Level of Assistance Modified independent, requires aide device or extra time  Assistive Device Four wheel walker  Distance Ambulated (ft) 430 ft  Mobility Ambulated with assistance in hallway  Mobility Response Tolerated well  Mobility performed by Mobility specialist  $Mobility charge 1 Mobility   Pre-Mobility: 101 HR, 97% SpO2 During Mobility: 115 HR Post-Mobility: 103 HR  Pt independent to stand from recliner, ambulated on 2 L/min [place]. Pt c/o feeling light headed upon standing but progressed better with time and distance. Pt to BR after walk and then back to bed per request. Pt has call bell and phone at his side.   [facility] [place]acht Mobility Specialist Mobility Specialist Phone #1: [phone] Mobility Specialist Phone #2: [phone]

## 2021-11-22 NOTE — TOC Transition Note (Signed)
Transition of Care [facility]) - CM/SW Discharge Note   Patient Details  Name: Dean Mendez MRN: [id number] Date of Birth: 10/22/1951  Transition of Care [facility] CM/SW Contact:  Leone Haven, RN Phone Number: 11/22/2021, 8:54 AM   Clinical Narrative:    Patient for dc home, he is set up with [facility] for [facility] and [employer] supplied the DME for him to go home with.    Final next level of care: Home w Home Health Services Barriers to Discharge: No Barriers Identified   Patient Goals and CMS Choice Patient states their goals for this hospitalization and ongoing recovery are:: return home CMS Medicare.gov Compare Post Acute Care list provided to:: Patient Choice offered to / list presented to : Patient  Discharge Placement                       Discharge Plan and Services   Discharge Planning Services: CM Consult Post Acute Care Choice: Durable Medical Equipment, Home Health          DME Arranged: 3-N-1, Oxygen, Walker rolling with seat DME Agency: [employer] Date DME Agency Contacted: 11/21/21 Time DME Agency Contacted: 1130 Representative spoke with at DME Agency: Ian Malkin HH Arranged: PT HH Agency: [facility] Date [facility] Agency Contacted: 11/21/21 Time HH Agency Contacted: 1412 Representative spoke with at [facility] Agency: Kandee Keen  Social Determinants of Health (SDOH) Interventions     Readmission Risk Interventions Readmission Risk Prevention Plan 11/21/2021  Post Dischage Appt Complete  Medication Screening Complete  Transportation Screening Complete  Some recent data might be hidden

## 2021-11-22 NOTE — Progress Notes (Signed)
Pt discharging home. AVS reviewed with pt. All questions answered. IV and telemetry box removed. All equipment and oxygen at the bedside. Son on the way to take pt home. Belongings packed.

## 2021-11-27 DIAGNOSIS — Z9981 Dependence on supplemental oxygen: Secondary | ICD-10-CM | POA: Diagnosis not present

## 2021-11-27 DIAGNOSIS — I2510 Atherosclerotic heart disease of native coronary artery without angina pectoris: Secondary | ICD-10-CM | POA: Diagnosis not present

## 2021-11-27 DIAGNOSIS — F17210 Nicotine dependence, cigarettes, uncomplicated: Secondary | ICD-10-CM | POA: Diagnosis not present

## 2021-11-27 DIAGNOSIS — L89322 Pressure ulcer of left buttock, stage 2: Secondary | ICD-10-CM | POA: Diagnosis not present

## 2021-11-27 DIAGNOSIS — I10 Essential (primary) hypertension: Secondary | ICD-10-CM | POA: Diagnosis not present

## 2021-11-27 DIAGNOSIS — Z9181 History of falling: Secondary | ICD-10-CM | POA: Diagnosis not present

## 2021-11-27 DIAGNOSIS — R69 Illness, unspecified: Secondary | ICD-10-CM | POA: Diagnosis not present

## 2021-11-27 DIAGNOSIS — Z48812 Encounter for surgical aftercare following surgery on the circulatory system: Secondary | ICD-10-CM | POA: Diagnosis not present

## 2021-11-27 DIAGNOSIS — L89312 Pressure ulcer of right buttock, stage 2: Secondary | ICD-10-CM | POA: Diagnosis not present

## 2021-11-27 DIAGNOSIS — I214 Non-ST elevation (NSTEMI) myocardial infarction: Secondary | ICD-10-CM | POA: Diagnosis not present

## 2021-11-27 DIAGNOSIS — Z7982 Long term (current) use of aspirin: Secondary | ICD-10-CM | POA: Diagnosis not present

## 2021-11-27 DIAGNOSIS — Z951 Presence of aortocoronary bypass graft: Secondary | ICD-10-CM | POA: Diagnosis not present

## 2021-11-27 DIAGNOSIS — E782 Mixed hyperlipidemia: Secondary | ICD-10-CM | POA: Diagnosis not present

## 2021-11-27 DIAGNOSIS — Z7902 Long term (current) use of antithrombotics/antiplatelets: Secondary | ICD-10-CM | POA: Diagnosis not present

## 2021-11-27 DIAGNOSIS — J432 Centrilobular emphysema: Secondary | ICD-10-CM | POA: Diagnosis not present

## 2021-11-27 DIAGNOSIS — M47814 Spondylosis without myelopathy or radiculopathy, thoracic region: Secondary | ICD-10-CM | POA: Diagnosis not present

## 2021-11-28 ENCOUNTER — Encounter (HOSPITAL_BASED_OUTPATIENT_CLINIC_OR_DEPARTMENT_OTHER): Payer: Self-pay | Admitting: Nurse Practitioner

## 2021-11-28 ENCOUNTER — Ambulatory Visit (HOSPITAL_BASED_OUTPATIENT_CLINIC_OR_DEPARTMENT_OTHER): Payer: Medicare HMO | Admitting: Nurse Practitioner

## 2021-11-28 ENCOUNTER — Other Ambulatory Visit: Payer: Self-pay

## 2021-11-28 ENCOUNTER — Ambulatory Visit (INDEPENDENT_AMBULATORY_CARE_PROVIDER_SITE_OTHER): Payer: Self-pay | Admitting: Thoracic Surgery (Cardiothoracic Vascular Surgery)

## 2021-11-28 VITALS — BP 122/80 | HR 88 | Ht 72.0 in | Wt 193.0 lb

## 2021-11-28 DIAGNOSIS — D649 Anemia, unspecified: Secondary | ICD-10-CM

## 2021-11-28 DIAGNOSIS — E875 Hyperkalemia: Secondary | ICD-10-CM | POA: Diagnosis not present

## 2021-11-28 DIAGNOSIS — E785 Hyperlipidemia, unspecified: Secondary | ICD-10-CM

## 2021-11-28 DIAGNOSIS — R600 Localized edema: Secondary | ICD-10-CM

## 2021-11-28 DIAGNOSIS — I10 Essential (primary) hypertension: Secondary | ICD-10-CM | POA: Diagnosis not present

## 2021-11-28 DIAGNOSIS — I214 Non-ST elevation (NSTEMI) myocardial infarction: Secondary | ICD-10-CM | POA: Diagnosis not present

## 2021-11-28 DIAGNOSIS — Z951 Presence of aortocoronary bypass graft: Secondary | ICD-10-CM

## 2021-11-28 MED ORDER — METOPROLOL TARTRATE 37.5 MG PO TABS: 37.5000 mg | ORAL_TABLET | Freq: Three times a day (TID) | ORAL | 1 refills | Status: DC

## 2021-11-28 MED ORDER — ROSUVASTATIN CALCIUM 40 MG PO TABS: 40.0000 mg | ORAL_TABLET | Freq: Every day | ORAL | 1 refills | Status: DC

## 2021-11-28 NOTE — Progress Notes (Signed)
     [address]       [name] [address]             [phone]       Patient: Home Provider: Office Consent for Telemedicine visit obtained.  Today's visit was completed via a real-time telehealth (see specific modality noted below). The patient/authorized person provided oral consent at the time of the visit to engage in a telemedicine encounter with the present provider at [facility]. The patient/authorized person was informed of the potential benefits, limitations, and risks of telemedicine. The patient/authorized person expressed understanding that the laws that protect confidentiality also apply to telemedicine. The patient/authorized person acknowledged understanding that telemedicine does not provide emergency services and that he or she would need to call 911 or proceed to the nearest hospital for help if such a need arose.   Total time spent in the clinical discussion 10 minutes.  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  Overall doing well.  Pain is minimal.  Ambulating well. Vitals have been good.  He was started on lasix for leg swelling.  Dean Mendez will see [place] back in 1 month with a chest x-ray for cardiac rehab clearance.  Dean Mendez

## 2021-11-28 NOTE — Progress Notes (Signed)
Cardiology Office Note:    Date:  11/28/2021   ID:  Dean Mendez, DOB 10-15-1951, MRN [id number]  PCP:  [name], MD   [place] Prov[facility] Cardiologist:  Carlyle Dolly, MD     Referring MD: [name], MD   Chief Complaint: hospital f/u s/p CABG  History of Present Illness:    Dean Mendez is a 70 y.o. male with a hx of NSTEMI, CAD s/p CABG x 2, HTN, CKD, syncope, hyperlipidemia, tobacco abuse  Presented to [facility] ED on 11/11/21 after syncopal episode at his home. He reports he became lightheaded while standing in his kitchen making breakfast around 10 AM.  He tried to make it to his sofa but fell on the ground, he does not think he was out for very long.  He regained consciousness hearing his wife call his name and he denies loss of bowel or bladder continence. Approximately 30 minutes after recovering from syncope, he developed chest pain and shortness of breath without nausea or diaphoresis.  In the ED, EKG did not show ST elevation or arrhythmia but initial troponin was 13 ? 77 and ED physician was concerned for ACS. Subsequent troponins rose to 3676 ?4175 and subsequent EKG showed lateral TWIs. He was transferred to [facility] for cardiac cath. LHC revealed severe multivessel disease, modest disease in LAD with heavily tortuous and calcified ramus intermediate as well as likely dominant LCx with severe disease. Original plan was to attempt PTCA/PCI of LCx with Aggrastat infusion overnight and planned attempted PCI of the ramus intermedius the following day. Upon attempted PTCA of circumflex, it appeared the vessel was totally occluded rather than subtotal occlusion and procedure was aborted. Continued angiography revealed dye hung up in left main into aortic root and there was concern for possible dissection. He was referred to CT surgery and underwent emergent CABG x 2 on 11/13/21. He required blood transfusion and had chest tube insertion for large right pneumothorax.  He was discharged on 11/22/21.   Today, he is here with his wife. He reports that he is feeling well but continues to be tired. He reports chest tenderness and pain with taking a deep breath, lower extremity edema and fatigue. He denies palpitations, melena, hematuria, hemoptysis, diaphoresis, weakness, presyncope, syncope, orthopnea, and PND. He continues to use home O2 when he is active at home but reports he has decreased the amount of time since hospital discharge. Feels that his breathing is improving but continues to be hesitant to breath deeply. Reports skin breakdown at the top of his buttock. Reports that home health is coming in 3 days to start home care for him.   Past Medical History:  Diagnosis Date   Hypertension     Past Surgical History:  Procedure Laterality Date   CORONARY ARTERY BYPASS GRAFT N/A 11/12/2021   Procedure: CORONARY ARTERY BYPASS GRAFTING (CABG) X TWO, USING LEFT INTERNAL MAMMARY ARTERY AND LEFT LEG GREATER SAPHENOUS VEIN - OPEN HARVEST;  Surgeon: Lajuana Matte, MD;  Location: [place];  Service: Open Heart Surgery;  Laterality: N/A;   CORONARY BALLOON ANGIOPLASTY N/A 11/12/2021   Procedure: CORONARY BALLOON ANGIOPLASTY;  Surgeon: [name], MD;  Location: [place] [facility];  Service: Cardiovascular;  Laterality: N/A;   IABP INSERTION N/A 11/12/2021   Procedure: IABP INSERTION;  Surgeon: [name], MD;  Location: [place] [facility];  Service: Cardiovascular;  Laterality: N/A;   LEFT HEART CATH AND CORONARY ANGIOGRAPHY N/A 11/12/2021   Procedure: LEFT HEART  CATH AND CORONARY ANGIOGRAPHY;  Surgeon: Marykay Lex, MD;  Location: [facility] INVASIVE CV LAB;  Service: Cardiovascular;  Laterality: N/A;   TEE WITHOUT CARDIOVERSION  11/12/2021   Procedure: TRANSESOPHAGEAL ECHOCARDIOGRAM (TEE);  Surgeon: Corliss Skains, MD;  Location: [facility];  Service: Open Heart Surgery;;    Current Medications: Current Meds  Medication Sig   acetaminophen (TYLENOL)  325 MG tablet Take 2 tablets (650 mg total) by mouth every 6 (six) hours as needed for mild pain or fever.   aspirin EC 81 MG tablet Take 81 mg by mouth daily. Swallow whole.   clopidogrel (PLAVIX) 75 MG tablet Take 1 tablet (75 mg total) by mouth daily.   ferrous fumarate-b12-vitamic C-folic acid (TRINSICON / FOLTRIN) capsule Take 1 capsule by mouth 2 (two) times daily after a meal.   Metoprolol Tartrate 37.5 MG TABS Take 37.5 mg by mouth 3 (three) times daily.   rosuvastatin (CRESTOR) 40 MG tablet Take 1 tablet (40 mg total) by mouth daily at 8 pm.   traMADol (ULTRAM) 50 MG tablet Take 1 tablet (50 mg total) by mouth every 6 (six) hours as needed for up to 7 days for moderate pain.     Allergies:   Patient has no known allergies.   Social History   Socioeconomic History   Marital status: Married    Spouse name: Not on file   Number of children: Not on file   Years of education: Not on file   Highest education level: Not on file  Occupational History   Not on file  Tobacco Use   Smoking status: Every Day    Packs/day: 0.25    Types: Cigarettes   Smokeless tobacco: Never  Substance and Sexual Activity   Alcohol use: Not Currently   Drug use: Never   Sexual activity: Not on file  Other Topics Concern   Not on file  Social History Narrative   Not on file   Social Determinants of Health   Financial Resource Strain: Not on file  Food Insecurity: Not on file  Transportation Needs: Not on file  Physical Activity: Not on file  Stress: Not on file  Social Connections: Not on file     Family History: The patient's family history includes Heart attack in his brother.  ROS:   Please see the history of present illness.  ++midsternal chest tenderness ++fatigue All other systems reviewed and are negative.  Labs/Other Studies Reviewed:    The following studies were reviewed today:  LHC 11/12/21  Ost LM to Prox LAD lesion is 30% stenosed with 80% stenosed side branch in  Ost Cx.  Post intervention, there is a 50% residual stenosis with distal LM dissection.  Post attempted intervention, the OM side branch was reduced to 80% residual stenosis.   Ramus-1 lesion is 40% stenosed. - mid 90%, distal mid thrombotic 90%.   Mid Cx to Dist Cx lesion is 95% stenosed. - plan was to attempt PCI   multiple wires were tried - unable to cross lesion   Post intervention, there is a 99% residual stenosis. TIMI 1 flow noted.   Prox RCA to Mid RCA lesion is 75% stenosed. non-dominant vessel   There is mild to moderate left ventricular systolic dysfunction.  The left ventricular ejection fraction is 45-50% by visual estimate.   LV end diastolic pressure is normal.   There is no aortic valve stenosis.   POST-OPERATIVE DIAGNOSIS:  Severe multivessel disease: Modest disease in the LAD, but heavily  tortuous and calcified ramus intermedius as well as likely dominant LCx with severe disease :  Ramus takes a 90 degree turn shortly after that another 90 degree turn and there is a filling defect that appears to be either thrombotic or calcification.  The downstream vessel has pruning with what appears to be possible distal embolization. LCx is an unusual takeoff, there is diffuse moderate 70% disease followed by a hairpin turn where there is apparently an occluded OM 2 branch.  There is a 99% stenosis at this lesion that is heavily calcified. RCA appears to be nondominant heavily diseased small caliber vessel. Original plan was to attempt PTCA/PCI of the LCx with Aggrastat infusion overnight and planned attempted PCI of the ramus intermedius on Friday. Attempted PTCA of the circumflex was performed using [place], Choice PT and whisper wires.  The vessel was very difficult to wire and was not able to reach beyond the lesion.  Imaging after wire removal revealed that the vessel was now no longer subtotally occluded but appeared to have total occlusion with TIMI I flow distally.  At this point  I chose to abort for further procedures.  On continued to of post wiring angiography, there appears to be dye hang up in the left main into the aortic root concerning for possible dissection.    Aggrastat bolus had been given but was discontinued.  CVTS consult called for potential emergent surgery.    EF appears to be 45 to 50%.  There appears to be apical inferior and mid to distal lateral-inferolateral hypokinesis. Normal LVEDP.   PATIENT DISPOSITION:  PACU - hemodynamically stable.            Stat echocardiogram called            CVTS consult call with Dr. Kipp Brood -> likely emergent surgery.  Would like to consider CABG.            Antiplatelet agents have not stopped.     Echo 11/12/21  Left Ventricle: Left ventricular ejection fraction, by estimation, is 55  to 60%. The left ventricle has normal function. The left ventricle  demonstrates regional wall motion abnormalities. The left ventricular  internal cavity size was normal in size.  There is mild left ventricular hypertrophy. Left ventricular diastolic  parameters are indeterminate.  LV Wall Scoring:  The apical septal segment is hypokinetic. The entire anterior wall, entire  lateral wall, anterior septum, entire inferior wall, mid inferoseptal  segment, basal inferoseptal segment, and apex are normal.  Right Ventricle: The right ventricular size is normal. No increase in  right ventricular wall thickness. Right ventricular systolic function is  normal. Tricuspid regurgitation signal is inadequate for assessing PA  pressure.  Left Atrium: Left atrial size was normal in size.  Right Atrium: Right atrial size was normal in size.  Pericardium: Trivial pericardial effusion is present. The pericardial  effusion is posterior to the left ventricle and anterior to the right  ventricle.  Mitral Valve: The mitral valve is grossly normal. Trivial mitral valve  regurgitation.  Tricuspid Valve: The tricuspid valve is grossly  normal. Tricuspid valve  regurgitation is trivial.  Aortic Valve: The aortic valve is tricuspid. There is mild aortic valve  annular calcification. Aortic valve regurgitation is not visualized.  Aortic valve sclerosis is present, with no evidence of aortic valve  stenosis.  Pulmonic Valve: The pulmonic valve was grossly normal. Pulmonic valve  regurgitation is not visualized.  Aorta: The aortic root is normal in size and structure.  Venous: The inferior vena cava is normal in size with greater than 50%  respiratory variability, suggesting right atrial pressure of 3 mmHg.  IAS/Shunts: No atrial level shunt detected by color flow Doppler.    Echo Intraop TEE 11/12/21  POST-OP IMPRESSIONS  _ Left Ventricle: The left ventricle is unchanged from pre-bypass.  _ Right Ventricle: The right ventricle appears unchanged from pre-bypass.  _ Aorta: The aorta appears unchanged from pre-bypass.  _ Left Atrial Appendage: The left atrial appendage appears unchanged from  pre-bypass.  _ Aortic Valve: The aortic valve appears unchanged from pre-bypass.  _ Mitral Valve: There is mild regurgitation.  _ Tricuspid Valve: The tricuspid valve appears unchanged from pre-bypass.  _ Pulmonic Valve: The pulmonic valve appears unchanged from pre-bypass.  _ Comments: No significant pericardial effusion.   PRE-OP FINDINGS   Left Ventricle: The left ventricle has hyperdynamic systolic function,  with an ejection fraction of >65%. The cavity size was decreased. There is severe concentric left ventricular hypertrophy. Left ventricular diastolic  function could not be evaluated.  Right Ventricle: The right ventricle has normal systolic function. The  cavity was normal. There is no increase in right ventricular wall  thickness.  Left Atrium: Left atrial size was normal in size. No left atrial/left  atrial appendage thrombus was detected. The left atrial appendage is well visualized and there is no evidence of  thrombus present.  Right Atrium: Right atrial size was normal in size.  Interatrial Septum: No atrial level shunt detected by color flow Doppler.  There is no evidence of a patent foramen ovale.  Pericardium: A small pericardial effusion is present. The pericardial  effusion is circumferential.  Mitral Valve: The mitral valve is normal in structure. Mitral valve  regurgitation is trivial by color flow Doppler.  Tricuspid Valve: The tricuspid valve was normal in structure. Tricuspid  valve regurgitation is mild by color flow Doppler.  Aortic Valve: The aortic valve is normal in structure. Aortic valve  regurgitation was not visualized by color flow Doppler. There is no  stenosis of the aortic valve. There is no evidence of aortic valve  vegetation.  Pulmonic Valve: The pulmonic valve was normal in structure.  Pulmonic valve regurgitation is trivial by color flow Doppler.  Aorta: The ascending aorta and aortic root are normal in size and  structure. There is evidence of plaque in the descending aorta; Grade III,  measuring 3-56mm in size. Shunts: There is no evidence of an atrial septal defect.    Recent Labs: 11/12/2021: ALT 17 11/14/2021: Magnesium 2.6 11/20/2021: Hemoglobin 8.0; Platelets 352 11/21/2021: BUN 10; Creatinine, Ser 1.17; Potassium 3.8; Sodium 141   Recent Lipid Panel    Component Value Date/Time   CHOL 46 11/14/2021 0445   TRIG 49 11/14/2021 0445   HDL 15 (L) 11/14/2021 0445   CHOLHDL 3.1 11/14/2021 0445   VLDL 10 11/14/2021 0445   LDLCALC 21 11/14/2021 0445     Physical Exam:    VS:  BP 122/80 (BP Location: Left Arm, Patient Position: Sitting, Cuff Size: Normal)   Pulse 88   Ht 6' (1.829 m)   Wt 193 lb (87.5 kg)   SpO2 92%   BMI 26.18 kg/m     Wt Readings from Last 3 Encounters:  11/28/21 193 lb (87.5 kg)  11/21/21 188 lb 0.8 oz (85.3 kg)  10/26/19 195 lb (88.5 kg)     GEN:  Well nourished, well developed in no acute distress HEENT: Normal NECK: No  JVD; No  carotid bruits LYMPHATICS: No lymphadenopathy CARDIAC: RRR, no murmurs, rubs, gallops RESPIRATORY:  Clear to auscultation without rales, wheezing or rhonchi  ABDOMEN: Soft, non-tender, non-distended MUSCULOSKELETAL:  1+ pitting edema bilateral lower extremities. No deformity  SKIN: Warm and dry. Surgical site on LLE has well-approximated edges, no redness or drainage. Sternal incision site has well-approximated edges without redness or drainage.  NEUROLOGIC:  Alert and oriented x 3 PSYCHIATRIC:  Normal affect   EKG:  EKG is ordered today.  The ekg ordered today demonstrates NSR at 88 bpm, TWI V5-V6 stable post CABG  Diagnoses:    1. Anemia, unspecified type   2. Hypertension, unspecified type   3. NSTEMI (non-ST elevated myocardial infarction) ([place])   4. Hyperlipidemia LDL goal <70   5. Essential hypertension   6. Bilateral leg edema    Assessment and Plan:     CAD native s/p CABG x 2: He has some mild chest tenderness in the area of the sternal incision. Incision is healing well with edges well approximated, and no drainage or redness.  He denies shortness of breath or other concerning symptoms for angina. He reports fatigue and his wife states he is sleeping a lot.  In reviewing his medications he reported that he is taking tramadol for pain several times a day. Encouraged him to get Tylenol to take up to 3 times daily as needed for pain and to reserve the tramadol for severe pain.  He reports some skin breakdown in the area of his buttocks since the hospitalization. Admits that he continues to sit a lot.  Encouraged him to increase activity and to allow the area to have some exposure to air. Will defer further treatment to primary care.  Continue aspirin, Plavix, metoprolol, and rosuvastatin. Will defer decision regarding DAPT to CT Surgery.   Bilateral leg edema: LVEF >65% on 11/12/21 by intraoperative TEE. He has 1+ pitting edema bilateral lower extremities.  He reports that  this is uncomfortable, particularly in the left leg. We will have him start furosemide 20 mg daily for 3 days then as needed for continued leg edema. Advised him to contact us if the leg edema persists beyond 7 to 10 days. Will need basic metabolic panel at that point. Will check K+ today and will add potassium chloride if needed while taking furosemide.   Hyperlipidemia LDL goal < 70: LDL 21 on 11/14/21, at goal.  He also has a very low HDL. Encouraged increasing physical activity as tolerated. He is interested in cardiac rehab when he is cleared by CT surgery. Continue Crestor.  Essential hypertension: Blood pressure is stable today. He was hypotensive during hospitalization and anti-hypertensives were stopped. He is not monitoring his blood pressure at home currently. He reports that home health will be coming starting in 3 days. Continue to monitor. Adding furosemide 20 mg daily x 3 days then as needed for leg edema. Continue metoprolol.   Anemia: On Plavix and aspirin s/p CABG. Required blood transfusion during hospital admission. Will recheck CBC today.  He denies reports of bleeding since discharge from hospital. Is taking an iron supplement and is noting dark tarry looking stools. Advised him to continue to monitor stool and to advise [place] if he has any episodes of heavy bleeding. Will defer decision for continuation of DAPT to CT Surgery.   Disposition: 3 mo f/u with Dr. Zandra Abts or [facility]    Cardiac Rehabilitation Eligibility Assessment  The patient is ready to start cardiac rehabilitation pending clearance from the  cardiac surgeon.      Medication Adjustments/Labs and Tests Ordered: Current medicines are reviewed at length with the patient today.  Concerns regarding medicines are outlined above.  Orders Placed This Encounter  Procedures   Basic metabolic panel   CBC   EKG 12-Lead    No orders of the defined types were placed in this encounter.   Patient Instructions  Medication  Instructions:  Your physician has recommended you make the following change in your medication:   Start: Furosemide (lasix) 20mg  tablet 1 time daily for 3 days then switch to as needed for a weight gain of 2 pounds overnight or 5 pounds in a week.   Start: Tylenol 500mg  2 tablets up to 3 times a day for pain   *If you need a refill on your cardiac medications before your next appointment, please call your pharmacy*   Lab Work: Your physician recommends that you return for lab work Today (3rd floor)- CBC, BMET   If you have labs (blood work) drawn today and your tests are completely normal, you will receive your results only by: MyChart Message (if you have MyChart) OR A paper copy in the mail If you have any lab test that is abnormal or we need to change your treatment, we will call you to review the results.   Testing/Procedures: None ordered today    Follow-Up: At [facility], you and your health needs are our priority.  As part of our continuing mission to provide you with exceptional heart care, we have created designated Provider Care Teams.  These Care Teams include your primary Cardiologist (physician) and Advanced Practice Providers (APPs -  Physician Assistants and Nurse Practitioners) who all work together to provide you with the care you need, when you need it.  We recommend signing up for the patient portal called "MyChart".  Sign up information is provided on this After Visit Summary.  MyChart is used to connect with patients for Virtual Visits (Telemedicine).  Patients are able to view lab/test results, encounter notes, upcoming appointments, etc.  Non-urgent messages can be sent to your provider as well.   To learn more about what you can do with MyChart, go to [email or web address].    Your next appointment:   3 month(s)  The format for your next appointment:   In Person  Provider:   Carlyle Dolly, MD     Other Instructions Mediterranean Diet A  Mediterranean diet refers to food and lifestyle choices that are based on the traditions of countries located on the [place]. It focuses on eating more fruits, vegetables, whole grains, beans, nuts, seeds, and heart-healthy fats, and eating less dairy, meat, eggs, and processed foods with added sugar, salt, and fat. This way of eating has been shown to help prevent certain conditions and improve outcomes for people who have chronic diseases, like kidney disease and heart disease. What are tips for following this plan? Reading food labels Check the serving size of packaged foods. For foods such as rice and pasta, the serving size refers to the amount of cooked product, not dry. Check the total fat in packaged foods. Avoid foods that have saturated fat or trans fats. Check the ingredient list for added sugars, such as corn syrup. Shopping  Buy a variety of foods that offer a balanced diet, including: Fresh fruits and vegetables (produce). Grains, beans, nuts, and seeds. Some of these may be available in unpackaged forms or large amounts (in bulk). Fresh seafood. Poultry and eggs.  Low-fat dairy products. Buy whole ingredients instead of prepackaged foods. Buy fresh fruits and vegetables in-season from local farmers markets. Buy plain frozen fruits and vegetables. If you do not have access to quality fresh seafood, buy precooked frozen shrimp or canned fish, such as tuna, salmon, or sardines. Stock your pantry so you always have certain foods on hand, such as olive oil, canned tuna, canned tomatoes, rice, pasta, and beans. Cooking Cook foods with extra-virgin olive oil instead of using butter or other vegetable oils. Have meat as a side dish, and have vegetables or grains as your main dish. This means having meat in small portions or adding small amounts of meat to foods like pasta or stew. Use beans or vegetables instead of meat in common dishes like chili or lasagna. Experiment with  different cooking methods. Try roasting, broiling, steaming, and sauting vegetables. Add frozen vegetables to soups, stews, pasta, or rice. Add nuts or seeds for added healthy fats and plant protein at each meal. You can add these to yogurt, salads, or vegetable dishes. Marinate fish or vegetables using olive oil, lemon juice, garlic, and fresh herbs. Meal planning Plan to eat one vegetarian meal one day each week. Try to work up to two vegetarian meals, if possible. Eat seafood two or more times a week. Have healthy snacks readily available, such as: Vegetable sticks with hummus. Greek yogurt. Fruit and nut trail mix. Eat balanced meals throughout the week. This includes: Fruit: 2-3 servings a day. Vegetables: 4-5 servings a day. Low-fat dairy: 2 servings a day. Fish, poultry, or lean meat: 1 serving a day. Beans and legumes: 2 or more servings a week. Nuts and seeds: 1-2 servings a day. Whole grains: 6-8 servings a day. Extra-virgin olive oil: 3-4 servings a day. Limit red meat and sweets to only a few servings a month. Lifestyle  Cook and eat meals together with your family, when possible. Drink enough fluid to keep your urine pale yellow. Be physically active every day. This includes: Aerobic exercise like running or swimming. Leisure activities like gardening, walking, or housework. Get 7-8 hours of sleep each night. If recommended by your health care provider, drink red wine in moderation. This means 1 glass a day for nonpregnant women and 2 glasses a day for men. A glass of wine equals 5 oz (150 mL). What foods should I eat? Fruits Apples. Apricots. Avocado. Berries. Bananas. Cherries. Dates. Figs. Grapes. Lemons. Melon. Oranges. Peaches. Plums. Pomegranate. Vegetables Artichokes. Beets. Broccoli. Cabbage. Carrots. Eggplant. Green beans. Chard. Kale. Spinach. Onions. Leeks. Peas. Squash. Tomatoes. Peppers. Radishes. Grains Whole-grain pasta. Brown rice. Bulgur wheat.  Polenta. Couscous. Whole-wheat bread. [place]. Meats and other proteins Beans. Almonds. Sunflower seeds. Pine nuts. Peanuts. [place]. Salmon. Scallops. Shrimp. [place]. Tilapia. Clams. Oysters. Eggs. Poultry without skin. Dairy Low-fat milk. Cheese. Greek yogurt. Fats and oils Extra-virgin olive oil. Avocado oil. Grapeseed oil. Beverages Water. Red wine. Herbal tea. Sweets and desserts Greek yogurt with honey. Baked apples. Poached pears. Trail mix. Seasonings and condiments Basil. Cilantro. Coriander. Cumin. Mint. Parsley. Sage. Rosemary. Tarragon. Garlic. Oregano. Thyme. Pepper. Balsamic vinegar. Tahini. Hummus. Tomato sauce. Olives. Mushrooms. The items listed above may not be a complete list of foods and beverages you can eat. Contact a dietitian for more information. What foods should I limit? This is a list of foods that should be eaten rarely or only on special occasions. Fruits Fruit canned in syrup. Vegetables Deep-fried potatoes (french fries). Grains Prepackaged pasta or rice dishes. Prepackaged cereal with  added sugar. Prepackaged snacks with added sugar. Meats and other proteins Beef. Pork. Lamb. Poultry with skin. Hot dogs. Berniece Salines. Dairy Ice cream. Sour cream. Whole milk. Fats and oils Butter. Canola oil. Vegetable oil. Beef fat (tallow). Lard. Beverages Juice. Sugar-sweetened soft drinks. Beer. Liquor and spirits. Sweets and desserts Cookies. Cakes. Pies. Candy. Seasonings and condiments Mayonnaise. Pre-made sauces and marinades. The items listed above may not be a complete list of foods and beverages you should limit. Contact a dietitian for more information. Summary The Mediterranean diet includes both food and lifestyle choices. Eat a variety of fresh fruits and vegetables, beans, nuts, seeds, and whole grains. Limit the amount of red meat and sweets that you eat. If recommended by your health care provider, drink red wine in moderation. This means 1 glass a  day for nonpregnant women and 2 glasses a day for men. A glass of wine equals 5 oz (150 mL). This information is not intended to replace advice given to you by your health care provider. Make sure you discuss any questions you have with your health care provider. Document Revised: 01/12/2020 Document Reviewed: 11/09/2019 Elsevier Patient Education  2022 [place], Emmaline Life, NP  11/28/2021 4:20 PM     [facility]

## 2021-11-28 NOTE — Patient Instructions (Signed)
Medication Instructions:  Your physician has recommended you make the following change in your medication:   Start: Furosemide (lasix) 20mg  tablet 1 time daily for 3 days then switch to as needed for a weight gain of 2 pounds overnight or 5 pounds in a week.   Start: Tylenol 500mg  2 tablets up to 3 times a day for pain   *If you need a refill on your cardiac medications before your next appointment, please call your pharmacy*   Lab Work: Your physician recommends that you return for lab work Today (3rd floor)- CBC, BMET   If you have labs (blood work) drawn today and your tests are completely normal, you will receive your results only by: MyChart Message (if you have MyChart) OR A paper copy in the mail If you have any lab test that is abnormal or we need to change your treatment, we will call you to review the results.   Testing/Procedures: None ordered today    Follow-Up: At [facility], you and your health needs are our priority.  As part of our continuing mission to provide you with exceptional heart care, we have created designated Provider Care Teams.  These Care Teams include your primary Cardiologist (physician) and Advanced Practice Providers (APPs -  Physician Assistants and Nurse Practitioners) who all work together to provide you with the care you need, when you need it.  We recommend signing up for the patient portal called "MyChart".  Sign up information is provided on this After Visit Summary.  MyChart is used to connect with patients for Virtual Visits (Telemedicine).  Patients are able to view lab/test results, encounter notes, upcoming appointments, etc.  Non-urgent messages can be sent to your provider as well.   To learn more about what you can do with MyChart, go to .    Your next appointment:   3 month(s)  The format for your next appointment:   In Person  Provider:   [facility] MD     Other Instructions Mediterranean  Diet A Mediterranean diet refers to food and lifestyle choices that are based on the traditions of countries located on the [place] Sea. It focuses on eating more fruits, vegetables, whole grains, beans, nuts, seeds, and heart-healthy fats, and eating less dairy, meat, eggs, and processed foods with added sugar, salt, and fat. This way of eating has been shown to help prevent certain conditions and improve outcomes for people who have chronic diseases, like kidney disease and heart disease. What are tips for following this plan? Reading food labels Check the serving size of packaged foods. For foods such as rice and pasta, the serving size refers to the amount of cooked product, not dry. Check the total fat in packaged foods. Avoid foods that have saturated fat or trans fats. Check the ingredient list for added sugars, such as corn syrup. Shopping  Buy a variety of foods that offer a balanced diet, including: Fresh fruits and vegetables (produce). Grains, beans, nuts, and seeds. Some of these may be available in unpackaged forms or large amounts (in bulk). Fresh seafood. Poultry and eggs. Low-fat dairy products. Buy whole ingredients instead of prepackaged foods. Buy fresh fruits and vegetables in-season from local farmers markets. Buy plain frozen fruits and vegetables. If you do not have access to quality fresh seafood, buy precooked frozen shrimp or canned fish, such as tuna, salmon, or sardines. Stock your pantry so you always have certain foods on hand, such as olive oil, canned tuna, canned tomatoes,  rice, pasta, and beans. Cooking Cook foods with extra-virgin olive oil instead of using butter or other vegetable oils. Have meat as a side dish, and have vegetables or grains as your main dish. This means having meat in small portions or adding small amounts of meat to foods like pasta or stew. Use beans or vegetables instead of meat in common dishes like chili or lasagna. Experiment  with different cooking methods. Try roasting, broiling, steaming, and sauting vegetables. Add frozen vegetables to soups, stews, pasta, or rice. Add nuts or seeds for added healthy fats and plant protein at each meal. You can add these to yogurt, salads, or vegetable dishes. Marinate fish or vegetables using olive oil, lemon juice, garlic, and fresh herbs. Meal planning Plan to eat one vegetarian meal one day each week. Try to work up to two vegetarian meals, if possible. Eat seafood two or more times a week. Have healthy snacks readily available, such as: Vegetable sticks with hummus. Greek yogurt. Fruit and nut trail mix. Eat balanced meals throughout the week. This includes: Fruit: 2-3 servings a day. Vegetables: 4-5 servings a day. Low-fat dairy: 2 servings a day. Fish, poultry, or lean meat: 1 serving a day. Beans and legumes: 2 or more servings a week. Nuts and seeds: 1-2 servings a day. Whole grains: 6-8 servings a day. Extra-virgin olive oil: 3-4 servings a day. Limit red meat and sweets to only a few servings a month. Lifestyle  Cook and eat meals together with your family, when possible. Drink enough fluid to keep your urine pale yellow. Be physically active every day. This includes: Aerobic exercise like running or swimming. Leisure activities like gardening, walking, or housework. Get 7-8 hours of sleep each night. If recommended by your health care provider, drink red wine in moderation. This means 1 glass a day for nonpregnant women and 2 glasses a day for men. A glass of wine equals 5 oz (150 mL). What foods should I eat? Fruits Apples. Apricots. Avocado. Berries. Bananas. Cherries. Dates. Figs. Grapes. Lemons. Melon. Oranges. Peaches. Plums. Pomegranate. Vegetables Artichokes. Beets. Broccoli. Cabbage. Carrots. Eggplant. Green beans. Chard. Kale. Spinach. Onions. Leeks. Peas. Squash. Tomatoes. Peppers. Radishes. Grains Whole-grain pasta. Brown rice. Bulgur wheat.  Polenta. Couscous. Whole-wheat bread. Orpah Cobb. Meats and other proteins Beans. Almonds. Sunflower seeds. Pine nuts. Peanuts. Cod. Salmon. Scallops. Shrimp. Tuna. Tilapia. Clams. Oysters. Eggs. Poultry without skin. Dairy Low-fat milk. Cheese. Greek yogurt. Fats and oils Extra-virgin olive oil. Avocado oil. Grapeseed oil. Beverages Water. Red wine. Herbal tea. Sweets and desserts Greek yogurt with honey. Baked apples. Poached pears. Trail mix. Seasonings and condiments Basil. Cilantro. Coriander. Cumin. Mint. Parsley. Sage. Rosemary. Tarragon. Garlic. Oregano. Thyme. Pepper. Balsamic vinegar. Tahini. Hummus. Tomato sauce. Olives. Mushrooms. The items listed above may not be a complete list of foods and beverages you can eat. Contact a dietitian for more information. What foods should I limit? This is a list of foods that should be eaten rarely or only on special occasions. Fruits Fruit canned in syrup. Vegetables Deep-fried potatoes (french fries). Grains Prepackaged pasta or rice dishes. Prepackaged cereal with added sugar. Prepackaged snacks with added sugar. Meats and other proteins Beef. Pork. Lamb. Poultry with skin. Hot dogs. Tomasa Blase. Dairy Ice cream. Sour cream. Whole milk. Fats and oils Butter. Canola oil. Vegetable oil. Beef fat (tallow). Lard. Beverages Juice. Sugar-sweetened soft drinks. Beer. Liquor and spirits. Sweets and desserts Cookies. Cakes. Pies. Candy. Seasonings and condiments Mayonnaise. Pre-made sauces and marinades. The items listed above may  not be a complete list of foods and beverages you should limit. Contact a dietitian for more information. Summary The Mediterranean diet includes both food and lifestyle choices. Eat a variety of fresh fruits and vegetables, beans, nuts, seeds, and whole grains. Limit the amount of red meat and sweets that you eat. If recommended by your health care provider, drink red wine in moderation. This means 1 glass a  day for nonpregnant women and 2 glasses a day for men. A glass of wine equals 5 oz (150 mL). This information is not intended to replace advice given to you by your health care provider. Make sure you discuss any questions you have with your health care provider. Document Revised: 01/12/2020 Document Reviewed: 11/09/2019 Elsevier Patient Education  2022 [employer].

## 2021-11-29 ENCOUNTER — Other Ambulatory Visit: Payer: Self-pay | Admitting: Internal Medicine

## 2021-11-29 LAB — CBC
Hematocrit: 31.8 % — ABNORMAL LOW (ref 37.5–51.0)
Hemoglobin: 10 g/dL — ABNORMAL LOW (ref 13.0–17.7)
MCH: 27.9 pg (ref 26.6–33.0)
MCHC: 31.4 g/dL — ABNORMAL LOW (ref 31.5–35.7)
MCV: 89 fL (ref 79–97)
Platelets: 588 10*3/uL — ABNORMAL HIGH (ref 150–450)
RBC: 3.58 x10E6/uL — ABNORMAL LOW (ref 4.14–5.80)
RDW: 14.7 % (ref 11.6–15.4)
WBC: 9.5 10*3/uL (ref 3.4–10.8)

## 2021-11-29 LAB — BASIC METABOLIC PANEL
BUN/Creatinine Ratio: 14 (ref 10–24)
BUN: 17 mg/dL (ref 8–27)
CO2: 15 mmol/L — ABNORMAL LOW (ref 20–29)
Calcium: 9.5 mg/dL (ref 8.6–10.2)
Chloride: 104 mmol/L (ref 96–106)
Creatinine, Ser: 1.2 mg/dL (ref 0.76–1.27)
Glucose: 92 mg/dL (ref 70–99)
Potassium: 5.6 mmol/L — ABNORMAL HIGH (ref 3.5–5.2)
Sodium: 142 mmol/L (ref 134–144)
eGFR: 66 mL/min/{1.73_m2} (ref >59)

## 2021-11-29 MED ORDER — FUROSEMIDE 20 MG PO TABS: ORAL_TABLET | ORAL | 11 refills | Status: DC

## 2021-11-29 NOTE — Progress Notes (Signed)
Prescribed lasix as per plans in notes

## 2021-11-30 ENCOUNTER — Other Ambulatory Visit: Payer: Self-pay | Admitting: Student

## 2021-11-30 MED ORDER — FUROSEMIDE 20 MG PO TABS: ORAL_TABLET | ORAL | 5 refills | Status: DC

## 2021-12-01 DIAGNOSIS — Z9981 Dependence on supplemental oxygen: Secondary | ICD-10-CM | POA: Diagnosis not present

## 2021-12-01 DIAGNOSIS — I2510 Atherosclerotic heart disease of native coronary artery without angina pectoris: Secondary | ICD-10-CM | POA: Diagnosis not present

## 2021-12-01 DIAGNOSIS — E782 Mixed hyperlipidemia: Secondary | ICD-10-CM | POA: Diagnosis not present

## 2021-12-01 DIAGNOSIS — E875 Hyperkalemia: Secondary | ICD-10-CM | POA: Diagnosis not present

## 2021-12-01 DIAGNOSIS — L89322 Pressure ulcer of left buttock, stage 2: Secondary | ICD-10-CM | POA: Diagnosis not present

## 2021-12-01 DIAGNOSIS — R69 Illness, unspecified: Secondary | ICD-10-CM | POA: Diagnosis not present

## 2021-12-01 DIAGNOSIS — Z7902 Long term (current) use of antithrombotics/antiplatelets: Secondary | ICD-10-CM | POA: Diagnosis not present

## 2021-12-01 DIAGNOSIS — J432 Centrilobular emphysema: Secondary | ICD-10-CM | POA: Diagnosis not present

## 2021-12-01 DIAGNOSIS — F17210 Nicotine dependence, cigarettes, uncomplicated: Secondary | ICD-10-CM | POA: Diagnosis not present

## 2021-12-01 DIAGNOSIS — I214 Non-ST elevation (NSTEMI) myocardial infarction: Secondary | ICD-10-CM | POA: Diagnosis not present

## 2021-12-01 DIAGNOSIS — Z48812 Encounter for surgical aftercare following surgery on the circulatory system: Secondary | ICD-10-CM | POA: Diagnosis not present

## 2021-12-01 DIAGNOSIS — Z9181 History of falling: Secondary | ICD-10-CM | POA: Diagnosis not present

## 2021-12-01 DIAGNOSIS — I10 Essential (primary) hypertension: Secondary | ICD-10-CM | POA: Diagnosis not present

## 2021-12-01 DIAGNOSIS — Z7982 Long term (current) use of aspirin: Secondary | ICD-10-CM | POA: Diagnosis not present

## 2021-12-01 DIAGNOSIS — Z951 Presence of aortocoronary bypass graft: Secondary | ICD-10-CM | POA: Diagnosis not present

## 2021-12-01 DIAGNOSIS — L89312 Pressure ulcer of right buttock, stage 2: Secondary | ICD-10-CM | POA: Diagnosis not present

## 2021-12-01 DIAGNOSIS — M47814 Spondylosis without myelopathy or radiculopathy, thoracic region: Secondary | ICD-10-CM | POA: Diagnosis not present

## 2021-12-01 LAB — BASIC METABOLIC PANEL
BUN/Creatinine Ratio: 8 — ABNORMAL LOW (ref 10–24)
BUN: 10 mg/dL (ref 8–27)
CO2: 23 mmol/L (ref 20–29)
Calcium: 9.9 mg/dL (ref 8.6–10.2)
Chloride: 104 mmol/L (ref 96–106)
Creatinine, Ser: 1.28 mg/dL — ABNORMAL HIGH (ref 0.76–1.27)
Glucose: 138 mg/dL — ABNORMAL HIGH (ref 70–99)
Potassium: 4.6 mmol/L (ref 3.5–5.2)
Sodium: 142 mmol/L (ref 134–144)
eGFR: 61 mL/min/{1.73_m2} (ref >59)

## 2021-12-01 NOTE — Addendum Note (Signed)
Addended by: Marlene Lard on: 12/01/2021 08:23 AM   Modules accepted: Orders

## 2021-12-01 NOTE — Progress Notes (Signed)
Called pt. To discuss lab results and need for coming in for labs today. Labs ordered. Pt. Is going to go for labs at [facility] in [place]. Two locations with address and hours provided.     "Potassium is elevated. This may be due to hemolysis but we need to repeat stat today. His hemoglobin is trending up which is encouraging. 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 Would like to repeat this in approximately 1 month."

## 2021-12-02 DIAGNOSIS — Z951 Presence of aortocoronary bypass graft: Secondary | ICD-10-CM | POA: Diagnosis not present

## 2021-12-02 DIAGNOSIS — Z48812 Encounter for surgical aftercare following surgery on the circulatory system: Secondary | ICD-10-CM | POA: Diagnosis not present

## 2021-12-02 DIAGNOSIS — Z7902 Long term (current) use of antithrombotics/antiplatelets: Secondary | ICD-10-CM | POA: Diagnosis not present

## 2021-12-02 DIAGNOSIS — I10 Essential (primary) hypertension: Secondary | ICD-10-CM | POA: Diagnosis not present

## 2021-12-02 DIAGNOSIS — L89312 Pressure ulcer of right buttock, stage 2: Secondary | ICD-10-CM | POA: Diagnosis not present

## 2021-12-02 DIAGNOSIS — I2510 Atherosclerotic heart disease of native coronary artery without angina pectoris: Secondary | ICD-10-CM | POA: Diagnosis not present

## 2021-12-02 DIAGNOSIS — J432 Centrilobular emphysema: Secondary | ICD-10-CM | POA: Diagnosis not present

## 2021-12-02 DIAGNOSIS — R69 Illness, unspecified: Secondary | ICD-10-CM | POA: Diagnosis not present

## 2021-12-02 DIAGNOSIS — E782 Mixed hyperlipidemia: Secondary | ICD-10-CM | POA: Diagnosis not present

## 2021-12-02 DIAGNOSIS — Z7982 Long term (current) use of aspirin: Secondary | ICD-10-CM | POA: Diagnosis not present

## 2021-12-02 DIAGNOSIS — M47814 Spondylosis without myelopathy or radiculopathy, thoracic region: Secondary | ICD-10-CM | POA: Diagnosis not present

## 2021-12-02 DIAGNOSIS — F17210 Nicotine dependence, cigarettes, uncomplicated: Secondary | ICD-10-CM | POA: Diagnosis not present

## 2021-12-02 DIAGNOSIS — Z9181 History of falling: Secondary | ICD-10-CM | POA: Diagnosis not present

## 2021-12-02 DIAGNOSIS — L89322 Pressure ulcer of left buttock, stage 2: Secondary | ICD-10-CM | POA: Diagnosis not present

## 2021-12-02 DIAGNOSIS — I214 Non-ST elevation (NSTEMI) myocardial infarction: Secondary | ICD-10-CM | POA: Diagnosis not present

## 2021-12-02 DIAGNOSIS — Z9981 Dependence on supplemental oxygen: Secondary | ICD-10-CM | POA: Diagnosis not present

## 2021-12-05 DIAGNOSIS — M47814 Spondylosis without myelopathy or radiculopathy, thoracic region: Secondary | ICD-10-CM | POA: Diagnosis not present

## 2021-12-05 DIAGNOSIS — L89322 Pressure ulcer of left buttock, stage 2: Secondary | ICD-10-CM | POA: Diagnosis not present

## 2021-12-05 DIAGNOSIS — F17210 Nicotine dependence, cigarettes, uncomplicated: Secondary | ICD-10-CM | POA: Diagnosis not present

## 2021-12-05 DIAGNOSIS — I10 Essential (primary) hypertension: Secondary | ICD-10-CM | POA: Diagnosis not present

## 2021-12-05 DIAGNOSIS — I2510 Atherosclerotic heart disease of native coronary artery without angina pectoris: Secondary | ICD-10-CM | POA: Diagnosis not present

## 2021-12-05 DIAGNOSIS — Z9981 Dependence on supplemental oxygen: Secondary | ICD-10-CM | POA: Diagnosis not present

## 2021-12-05 DIAGNOSIS — E782 Mixed hyperlipidemia: Secondary | ICD-10-CM | POA: Diagnosis not present

## 2021-12-05 DIAGNOSIS — Z9181 History of falling: Secondary | ICD-10-CM | POA: Diagnosis not present

## 2021-12-05 DIAGNOSIS — Z7902 Long term (current) use of antithrombotics/antiplatelets: Secondary | ICD-10-CM | POA: Diagnosis not present

## 2021-12-05 DIAGNOSIS — Z951 Presence of aortocoronary bypass graft: Secondary | ICD-10-CM | POA: Diagnosis not present

## 2021-12-05 DIAGNOSIS — I214 Non-ST elevation (NSTEMI) myocardial infarction: Secondary | ICD-10-CM | POA: Diagnosis not present

## 2021-12-05 DIAGNOSIS — Z7982 Long term (current) use of aspirin: Secondary | ICD-10-CM | POA: Diagnosis not present

## 2021-12-05 DIAGNOSIS — R69 Illness, unspecified: Secondary | ICD-10-CM | POA: Diagnosis not present

## 2021-12-05 DIAGNOSIS — J432 Centrilobular emphysema: Secondary | ICD-10-CM | POA: Diagnosis not present

## 2021-12-05 DIAGNOSIS — Z48812 Encounter for surgical aftercare following surgery on the circulatory system: Secondary | ICD-10-CM | POA: Diagnosis not present

## 2021-12-05 DIAGNOSIS — L89312 Pressure ulcer of right buttock, stage 2: Secondary | ICD-10-CM | POA: Diagnosis not present

## 2021-12-08 DIAGNOSIS — T8189XA Other complications of procedures, not elsewhere classified, initial encounter: Secondary | ICD-10-CM | POA: Diagnosis not present

## 2021-12-08 DIAGNOSIS — E782 Mixed hyperlipidemia: Secondary | ICD-10-CM | POA: Diagnosis not present

## 2021-12-08 DIAGNOSIS — L89322 Pressure ulcer of left buttock, stage 2: Secondary | ICD-10-CM | POA: Diagnosis not present

## 2021-12-08 DIAGNOSIS — F17210 Nicotine dependence, cigarettes, uncomplicated: Secondary | ICD-10-CM | POA: Diagnosis not present

## 2021-12-08 DIAGNOSIS — Z48812 Encounter for surgical aftercare following surgery on the circulatory system: Secondary | ICD-10-CM | POA: Diagnosis not present

## 2021-12-08 DIAGNOSIS — Z7902 Long term (current) use of antithrombotics/antiplatelets: Secondary | ICD-10-CM | POA: Diagnosis not present

## 2021-12-08 DIAGNOSIS — Z9181 History of falling: Secondary | ICD-10-CM | POA: Diagnosis not present

## 2021-12-08 DIAGNOSIS — Z7982 Long term (current) use of aspirin: Secondary | ICD-10-CM | POA: Diagnosis not present

## 2021-12-08 DIAGNOSIS — I2510 Atherosclerotic heart disease of native coronary artery without angina pectoris: Secondary | ICD-10-CM | POA: Diagnosis not present

## 2021-12-08 DIAGNOSIS — R69 Illness, unspecified: Secondary | ICD-10-CM | POA: Diagnosis not present

## 2021-12-08 DIAGNOSIS — J432 Centrilobular emphysema: Secondary | ICD-10-CM | POA: Diagnosis not present

## 2021-12-08 DIAGNOSIS — M47814 Spondylosis without myelopathy or radiculopathy, thoracic region: Secondary | ICD-10-CM | POA: Diagnosis not present

## 2021-12-08 DIAGNOSIS — Z951 Presence of aortocoronary bypass graft: Secondary | ICD-10-CM | POA: Diagnosis not present

## 2021-12-08 DIAGNOSIS — I214 Non-ST elevation (NSTEMI) myocardial infarction: Secondary | ICD-10-CM | POA: Diagnosis not present

## 2021-12-08 DIAGNOSIS — L89312 Pressure ulcer of right buttock, stage 2: Secondary | ICD-10-CM | POA: Diagnosis not present

## 2021-12-08 DIAGNOSIS — I10 Essential (primary) hypertension: Secondary | ICD-10-CM | POA: Diagnosis not present

## 2021-12-08 DIAGNOSIS — Z9981 Dependence on supplemental oxygen: Secondary | ICD-10-CM | POA: Diagnosis not present

## 2021-12-09 DIAGNOSIS — Z7902 Long term (current) use of antithrombotics/antiplatelets: Secondary | ICD-10-CM | POA: Diagnosis not present

## 2021-12-09 DIAGNOSIS — I2510 Atherosclerotic heart disease of native coronary artery without angina pectoris: Secondary | ICD-10-CM | POA: Diagnosis not present

## 2021-12-09 DIAGNOSIS — L89322 Pressure ulcer of left buttock, stage 2: Secondary | ICD-10-CM | POA: Diagnosis not present

## 2021-12-09 DIAGNOSIS — J432 Centrilobular emphysema: Secondary | ICD-10-CM | POA: Diagnosis not present

## 2021-12-09 DIAGNOSIS — Z9981 Dependence on supplemental oxygen: Secondary | ICD-10-CM | POA: Diagnosis not present

## 2021-12-09 DIAGNOSIS — L89312 Pressure ulcer of right buttock, stage 2: Secondary | ICD-10-CM | POA: Diagnosis not present

## 2021-12-09 DIAGNOSIS — Z9181 History of falling: Secondary | ICD-10-CM | POA: Diagnosis not present

## 2021-12-09 DIAGNOSIS — Z7982 Long term (current) use of aspirin: Secondary | ICD-10-CM | POA: Diagnosis not present

## 2021-12-09 DIAGNOSIS — I214 Non-ST elevation (NSTEMI) myocardial infarction: Secondary | ICD-10-CM | POA: Diagnosis not present

## 2021-12-09 DIAGNOSIS — I10 Essential (primary) hypertension: Secondary | ICD-10-CM | POA: Diagnosis not present

## 2021-12-09 DIAGNOSIS — R69 Illness, unspecified: Secondary | ICD-10-CM | POA: Diagnosis not present

## 2021-12-09 DIAGNOSIS — Z48812 Encounter for surgical aftercare following surgery on the circulatory system: Secondary | ICD-10-CM | POA: Diagnosis not present

## 2021-12-09 DIAGNOSIS — M47814 Spondylosis without myelopathy or radiculopathy, thoracic region: Secondary | ICD-10-CM | POA: Diagnosis not present

## 2021-12-09 DIAGNOSIS — E782 Mixed hyperlipidemia: Secondary | ICD-10-CM | POA: Diagnosis not present

## 2021-12-09 DIAGNOSIS — F17210 Nicotine dependence, cigarettes, uncomplicated: Secondary | ICD-10-CM | POA: Diagnosis not present

## 2021-12-09 DIAGNOSIS — Z951 Presence of aortocoronary bypass graft: Secondary | ICD-10-CM | POA: Diagnosis not present

## 2021-12-11 DIAGNOSIS — I214 Non-ST elevation (NSTEMI) myocardial infarction: Secondary | ICD-10-CM | POA: Diagnosis not present

## 2021-12-11 DIAGNOSIS — Z951 Presence of aortocoronary bypass graft: Secondary | ICD-10-CM | POA: Diagnosis not present

## 2021-12-11 DIAGNOSIS — Z9181 History of falling: Secondary | ICD-10-CM | POA: Diagnosis not present

## 2021-12-11 DIAGNOSIS — E782 Mixed hyperlipidemia: Secondary | ICD-10-CM | POA: Diagnosis not present

## 2021-12-11 DIAGNOSIS — L89322 Pressure ulcer of left buttock, stage 2: Secondary | ICD-10-CM | POA: Diagnosis not present

## 2021-12-11 DIAGNOSIS — I10 Essential (primary) hypertension: Secondary | ICD-10-CM | POA: Diagnosis not present

## 2021-12-11 DIAGNOSIS — J432 Centrilobular emphysema: Secondary | ICD-10-CM | POA: Diagnosis not present

## 2021-12-11 DIAGNOSIS — I2510 Atherosclerotic heart disease of native coronary artery without angina pectoris: Secondary | ICD-10-CM | POA: Diagnosis not present

## 2021-12-11 DIAGNOSIS — Z48812 Encounter for surgical aftercare following surgery on the circulatory system: Secondary | ICD-10-CM | POA: Diagnosis not present

## 2021-12-11 DIAGNOSIS — Z9981 Dependence on supplemental oxygen: Secondary | ICD-10-CM | POA: Diagnosis not present

## 2021-12-11 DIAGNOSIS — L89312 Pressure ulcer of right buttock, stage 2: Secondary | ICD-10-CM | POA: Diagnosis not present

## 2021-12-11 DIAGNOSIS — M47814 Spondylosis without myelopathy or radiculopathy, thoracic region: Secondary | ICD-10-CM | POA: Diagnosis not present

## 2021-12-11 DIAGNOSIS — R69 Illness, unspecified: Secondary | ICD-10-CM | POA: Diagnosis not present

## 2021-12-11 DIAGNOSIS — F17210 Nicotine dependence, cigarettes, uncomplicated: Secondary | ICD-10-CM | POA: Diagnosis not present

## 2021-12-11 DIAGNOSIS — Z7982 Long term (current) use of aspirin: Secondary | ICD-10-CM | POA: Diagnosis not present

## 2021-12-11 DIAGNOSIS — Z7902 Long term (current) use of antithrombotics/antiplatelets: Secondary | ICD-10-CM | POA: Diagnosis not present

## 2021-12-15 DIAGNOSIS — J432 Centrilobular emphysema: Secondary | ICD-10-CM | POA: Diagnosis not present

## 2021-12-15 DIAGNOSIS — R69 Illness, unspecified: Secondary | ICD-10-CM | POA: Diagnosis not present

## 2021-12-15 DIAGNOSIS — Z951 Presence of aortocoronary bypass graft: Secondary | ICD-10-CM | POA: Diagnosis not present

## 2021-12-15 DIAGNOSIS — M47814 Spondylosis without myelopathy or radiculopathy, thoracic region: Secondary | ICD-10-CM | POA: Diagnosis not present

## 2021-12-15 DIAGNOSIS — Z9981 Dependence on supplemental oxygen: Secondary | ICD-10-CM | POA: Diagnosis not present

## 2021-12-15 DIAGNOSIS — I10 Essential (primary) hypertension: Secondary | ICD-10-CM | POA: Diagnosis not present

## 2021-12-15 DIAGNOSIS — Z48812 Encounter for surgical aftercare following surgery on the circulatory system: Secondary | ICD-10-CM | POA: Diagnosis not present

## 2021-12-15 DIAGNOSIS — E782 Mixed hyperlipidemia: Secondary | ICD-10-CM | POA: Diagnosis not present

## 2021-12-15 DIAGNOSIS — I2510 Atherosclerotic heart disease of native coronary artery without angina pectoris: Secondary | ICD-10-CM | POA: Diagnosis not present

## 2021-12-15 DIAGNOSIS — Z7982 Long term (current) use of aspirin: Secondary | ICD-10-CM | POA: Diagnosis not present

## 2021-12-15 DIAGNOSIS — I214 Non-ST elevation (NSTEMI) myocardial infarction: Secondary | ICD-10-CM | POA: Diagnosis not present

## 2021-12-15 DIAGNOSIS — Z7902 Long term (current) use of antithrombotics/antiplatelets: Secondary | ICD-10-CM | POA: Diagnosis not present

## 2021-12-15 DIAGNOSIS — L89312 Pressure ulcer of right buttock, stage 2: Secondary | ICD-10-CM | POA: Diagnosis not present

## 2021-12-15 DIAGNOSIS — F17210 Nicotine dependence, cigarettes, uncomplicated: Secondary | ICD-10-CM | POA: Diagnosis not present

## 2021-12-15 DIAGNOSIS — Z9181 History of falling: Secondary | ICD-10-CM | POA: Diagnosis not present

## 2021-12-15 DIAGNOSIS — L89322 Pressure ulcer of left buttock, stage 2: Secondary | ICD-10-CM | POA: Diagnosis not present

## 2021-12-16 DIAGNOSIS — J432 Centrilobular emphysema: Secondary | ICD-10-CM | POA: Diagnosis not present

## 2021-12-16 DIAGNOSIS — L89322 Pressure ulcer of left buttock, stage 2: Secondary | ICD-10-CM | POA: Diagnosis not present

## 2021-12-16 DIAGNOSIS — Z951 Presence of aortocoronary bypass graft: Secondary | ICD-10-CM | POA: Diagnosis not present

## 2021-12-16 DIAGNOSIS — Z48812 Encounter for surgical aftercare following surgery on the circulatory system: Secondary | ICD-10-CM | POA: Diagnosis not present

## 2021-12-16 DIAGNOSIS — Z9181 History of falling: Secondary | ICD-10-CM | POA: Diagnosis not present

## 2021-12-16 DIAGNOSIS — F17210 Nicotine dependence, cigarettes, uncomplicated: Secondary | ICD-10-CM | POA: Diagnosis not present

## 2021-12-16 DIAGNOSIS — T8189XA Other complications of procedures, not elsewhere classified, initial encounter: Secondary | ICD-10-CM | POA: Diagnosis not present

## 2021-12-16 DIAGNOSIS — Z7902 Long term (current) use of antithrombotics/antiplatelets: Secondary | ICD-10-CM | POA: Diagnosis not present

## 2021-12-16 DIAGNOSIS — M47814 Spondylosis without myelopathy or radiculopathy, thoracic region: Secondary | ICD-10-CM | POA: Diagnosis not present

## 2021-12-16 DIAGNOSIS — I2510 Atherosclerotic heart disease of native coronary artery without angina pectoris: Secondary | ICD-10-CM | POA: Diagnosis not present

## 2021-12-16 DIAGNOSIS — Z9981 Dependence on supplemental oxygen: Secondary | ICD-10-CM | POA: Diagnosis not present

## 2021-12-16 DIAGNOSIS — I214 Non-ST elevation (NSTEMI) myocardial infarction: Secondary | ICD-10-CM | POA: Diagnosis not present

## 2021-12-16 DIAGNOSIS — I10 Essential (primary) hypertension: Secondary | ICD-10-CM | POA: Diagnosis not present

## 2021-12-16 DIAGNOSIS — E782 Mixed hyperlipidemia: Secondary | ICD-10-CM | POA: Diagnosis not present

## 2021-12-16 DIAGNOSIS — L89312 Pressure ulcer of right buttock, stage 2: Secondary | ICD-10-CM | POA: Diagnosis not present

## 2021-12-16 DIAGNOSIS — Z7982 Long term (current) use of aspirin: Secondary | ICD-10-CM | POA: Diagnosis not present

## 2021-12-16 DIAGNOSIS — R69 Illness, unspecified: Secondary | ICD-10-CM | POA: Diagnosis not present

## 2021-12-18 DIAGNOSIS — M47814 Spondylosis without myelopathy or radiculopathy, thoracic region: Secondary | ICD-10-CM | POA: Diagnosis not present

## 2021-12-18 DIAGNOSIS — Z48812 Encounter for surgical aftercare following surgery on the circulatory system: Secondary | ICD-10-CM | POA: Diagnosis not present

## 2021-12-18 DIAGNOSIS — Z951 Presence of aortocoronary bypass graft: Secondary | ICD-10-CM | POA: Diagnosis not present

## 2021-12-18 DIAGNOSIS — L89312 Pressure ulcer of right buttock, stage 2: Secondary | ICD-10-CM | POA: Diagnosis not present

## 2021-12-18 DIAGNOSIS — F17210 Nicotine dependence, cigarettes, uncomplicated: Secondary | ICD-10-CM | POA: Diagnosis not present

## 2021-12-18 DIAGNOSIS — I214 Non-ST elevation (NSTEMI) myocardial infarction: Secondary | ICD-10-CM | POA: Diagnosis not present

## 2021-12-18 DIAGNOSIS — L89322 Pressure ulcer of left buttock, stage 2: Secondary | ICD-10-CM | POA: Diagnosis not present

## 2021-12-18 DIAGNOSIS — Z7982 Long term (current) use of aspirin: Secondary | ICD-10-CM | POA: Diagnosis not present

## 2021-12-18 DIAGNOSIS — Z9981 Dependence on supplemental oxygen: Secondary | ICD-10-CM | POA: Diagnosis not present

## 2021-12-18 DIAGNOSIS — R69 Illness, unspecified: Secondary | ICD-10-CM | POA: Diagnosis not present

## 2021-12-18 DIAGNOSIS — E782 Mixed hyperlipidemia: Secondary | ICD-10-CM | POA: Diagnosis not present

## 2021-12-18 DIAGNOSIS — I10 Essential (primary) hypertension: Secondary | ICD-10-CM | POA: Diagnosis not present

## 2021-12-18 DIAGNOSIS — Z7902 Long term (current) use of antithrombotics/antiplatelets: Secondary | ICD-10-CM | POA: Diagnosis not present

## 2021-12-18 DIAGNOSIS — J432 Centrilobular emphysema: Secondary | ICD-10-CM | POA: Diagnosis not present

## 2021-12-18 DIAGNOSIS — Z9181 History of falling: Secondary | ICD-10-CM | POA: Diagnosis not present

## 2021-12-18 DIAGNOSIS — I2510 Atherosclerotic heart disease of native coronary artery without angina pectoris: Secondary | ICD-10-CM | POA: Diagnosis not present

## 2021-12-19 DIAGNOSIS — Z9981 Dependence on supplemental oxygen: Secondary | ICD-10-CM | POA: Diagnosis not present

## 2021-12-19 DIAGNOSIS — I2510 Atherosclerotic heart disease of native coronary artery without angina pectoris: Secondary | ICD-10-CM | POA: Diagnosis not present

## 2021-12-19 DIAGNOSIS — I214 Non-ST elevation (NSTEMI) myocardial infarction: Secondary | ICD-10-CM | POA: Diagnosis not present

## 2021-12-19 DIAGNOSIS — L89312 Pressure ulcer of right buttock, stage 2: Secondary | ICD-10-CM | POA: Diagnosis not present

## 2021-12-19 DIAGNOSIS — Z7982 Long term (current) use of aspirin: Secondary | ICD-10-CM | POA: Diagnosis not present

## 2021-12-19 DIAGNOSIS — Z951 Presence of aortocoronary bypass graft: Secondary | ICD-10-CM | POA: Diagnosis not present

## 2021-12-19 DIAGNOSIS — J432 Centrilobular emphysema: Secondary | ICD-10-CM | POA: Diagnosis not present

## 2021-12-19 DIAGNOSIS — Z48812 Encounter for surgical aftercare following surgery on the circulatory system: Secondary | ICD-10-CM | POA: Diagnosis not present

## 2021-12-19 DIAGNOSIS — L89322 Pressure ulcer of left buttock, stage 2: Secondary | ICD-10-CM | POA: Diagnosis not present

## 2021-12-19 DIAGNOSIS — F17210 Nicotine dependence, cigarettes, uncomplicated: Secondary | ICD-10-CM | POA: Diagnosis not present

## 2021-12-19 DIAGNOSIS — R69 Illness, unspecified: Secondary | ICD-10-CM | POA: Diagnosis not present

## 2021-12-19 DIAGNOSIS — Z7902 Long term (current) use of antithrombotics/antiplatelets: Secondary | ICD-10-CM | POA: Diagnosis not present

## 2021-12-19 DIAGNOSIS — Z9181 History of falling: Secondary | ICD-10-CM | POA: Diagnosis not present

## 2021-12-19 DIAGNOSIS — I10 Essential (primary) hypertension: Secondary | ICD-10-CM | POA: Diagnosis not present

## 2021-12-19 DIAGNOSIS — M47814 Spondylosis without myelopathy or radiculopathy, thoracic region: Secondary | ICD-10-CM | POA: Diagnosis not present

## 2021-12-19 DIAGNOSIS — E782 Mixed hyperlipidemia: Secondary | ICD-10-CM | POA: Diagnosis not present

## 2021-12-22 DIAGNOSIS — I214 Non-ST elevation (NSTEMI) myocardial infarction: Secondary | ICD-10-CM | POA: Diagnosis not present

## 2021-12-22 DIAGNOSIS — R531 Weakness: Secondary | ICD-10-CM | POA: Diagnosis not present

## 2021-12-22 DIAGNOSIS — I2542 Coronary artery dissection: Secondary | ICD-10-CM | POA: Diagnosis not present

## 2021-12-23 ENCOUNTER — Other Ambulatory Visit: Payer: Self-pay | Admitting: Thoracic Surgery (Cardiothoracic Vascular Surgery)

## 2021-12-23 DIAGNOSIS — Z951 Presence of aortocoronary bypass graft: Secondary | ICD-10-CM

## 2021-12-24 DIAGNOSIS — F17210 Nicotine dependence, cigarettes, uncomplicated: Secondary | ICD-10-CM | POA: Diagnosis not present

## 2021-12-24 DIAGNOSIS — Z48812 Encounter for surgical aftercare following surgery on the circulatory system: Secondary | ICD-10-CM | POA: Diagnosis not present

## 2021-12-24 DIAGNOSIS — M47814 Spondylosis without myelopathy or radiculopathy, thoracic region: Secondary | ICD-10-CM | POA: Diagnosis not present

## 2021-12-24 DIAGNOSIS — Z7982 Long term (current) use of aspirin: Secondary | ICD-10-CM | POA: Diagnosis not present

## 2021-12-24 DIAGNOSIS — Z9181 History of falling: Secondary | ICD-10-CM | POA: Diagnosis not present

## 2021-12-24 DIAGNOSIS — J432 Centrilobular emphysema: Secondary | ICD-10-CM | POA: Diagnosis not present

## 2021-12-24 DIAGNOSIS — E782 Mixed hyperlipidemia: Secondary | ICD-10-CM | POA: Diagnosis not present

## 2021-12-24 DIAGNOSIS — R69 Illness, unspecified: Secondary | ICD-10-CM | POA: Diagnosis not present

## 2021-12-24 DIAGNOSIS — I214 Non-ST elevation (NSTEMI) myocardial infarction: Secondary | ICD-10-CM | POA: Diagnosis not present

## 2021-12-24 DIAGNOSIS — I10 Essential (primary) hypertension: Secondary | ICD-10-CM | POA: Diagnosis not present

## 2021-12-24 DIAGNOSIS — Z7902 Long term (current) use of antithrombotics/antiplatelets: Secondary | ICD-10-CM | POA: Diagnosis not present

## 2021-12-24 DIAGNOSIS — I2510 Atherosclerotic heart disease of native coronary artery without angina pectoris: Secondary | ICD-10-CM | POA: Diagnosis not present

## 2021-12-24 DIAGNOSIS — Z951 Presence of aortocoronary bypass graft: Secondary | ICD-10-CM | POA: Diagnosis not present

## 2021-12-24 DIAGNOSIS — L89322 Pressure ulcer of left buttock, stage 2: Secondary | ICD-10-CM | POA: Diagnosis not present

## 2021-12-24 DIAGNOSIS — Z9981 Dependence on supplemental oxygen: Secondary | ICD-10-CM | POA: Diagnosis not present

## 2021-12-24 DIAGNOSIS — L89312 Pressure ulcer of right buttock, stage 2: Secondary | ICD-10-CM | POA: Diagnosis not present

## 2021-12-30 ENCOUNTER — Other Ambulatory Visit: Payer: Self-pay

## 2021-12-30 ENCOUNTER — Telehealth: Payer: Self-pay

## 2021-12-30 ENCOUNTER — Ambulatory Visit
Admission: RE | Admit: 2021-12-30 | Discharge: 2021-12-30 | Disposition: A | Discharge status: Home / Self Care | Payer: Medicare HMO | Source: Ambulatory Visit | Attending: Thoracic Surgery (Cardiothoracic Vascular Surgery) | Admitting: Thoracic Surgery (Cardiothoracic Vascular Surgery)

## 2021-12-30 ENCOUNTER — Ambulatory Visit (INDEPENDENT_AMBULATORY_CARE_PROVIDER_SITE_OTHER): Payer: Self-pay | Admitting: Surgical

## 2021-12-30 VITALS — BP 173/117 | HR 91 | Resp 20 | Ht 72.0 in | Wt 179.2 lb

## 2021-12-30 DIAGNOSIS — Z951 Presence of aortocoronary bypass graft: Secondary | ICD-10-CM

## 2021-12-30 DIAGNOSIS — J90 Pleural effusion, not elsewhere classified: Secondary | ICD-10-CM | POA: Diagnosis not present

## 2021-12-30 DIAGNOSIS — T8189XA Other complications of procedures, not elsewhere classified, initial encounter: Secondary | ICD-10-CM | POA: Diagnosis not present

## 2021-12-30 DIAGNOSIS — J9811 Atelectasis: Secondary | ICD-10-CM | POA: Diagnosis not present

## 2021-12-30 MED ORDER — FUROSEMIDE 40 MG PO TABS: 40.0000 mg | ORAL_TABLET | Freq: Every day | ORAL | 0 refills | Status: DC

## 2021-12-30 MED ORDER — AMLODIPINE BESYLATE 10 MG PO TABS: 10.0000 mg | ORAL_TABLET | Freq: Every day | ORAL | 2 refills | Status: DC

## 2021-12-30 MED ORDER — POTASSIUM CHLORIDE CRYS ER 20 MEQ PO TBCR: 20.0000 meq | EXTENDED_RELEASE_TABLET | Freq: Every day | ORAL | 0 refills | Status: DC

## 2021-12-30 NOTE — Patient Instructions (Signed)
Discussed activity progression including driving. °

## 2021-12-30 NOTE — Progress Notes (Signed)
[address]       [place] [address]             [phone]      Drayven E [facility]le [facility] Medical Record [id number] Date of Birth: February 24, 1951  Referring: [name], MD Primary Care: [name], MD Primary Cardiologist: Carlyle Dolly, MD   Chief Complaint:   POST OP FOLLOW UP 11/13/2021 Patient:  Dean Mendez Pre-Op Dx: NSTEMI                         Left main dissection                             3V CAD    Post-op Dx:  same Procedure: Emergency CABG X 2.  LIMA LAD, RSVG RI   Open greater saphenous vein harvest on the left     Surgeon and Role:      * Lightfoot, Lucile Crater, MD - Royal, PA-C - assisting An experienced assistant was required given the complexity of this surgery and the standard of surgical care. The assistant was needed for exposure, dissection, suctioning, retraction of delicate tissues and sutures, instrument exchange and for overall help during this procedure   History of Present Illness:    The patient is seen in the office on today's date and routine postsurgical follow-up.  Overall patient feels that he is making fairly steady progress.  He has had no significant difficulties with his incisions.  He does not have any fevers, chills or other significant constitutional symptoms.  He does experience lower extremity edema.  He is not having any palpitations.  He does have some occasional incisional discomfort but is only requiring Tylenol for pain relief.  He has some mild shortness of breath with activity but it is improving with time.      Past Medical History:  Diagnosis Date   Hypertension      Social History   Tobacco Use  Smoking Status Every Day   Packs/day: 0.25   Types: Cigarettes  Smokeless Tobacco Never    Social History   Substance and Sexual Activity  Alcohol Use Not Currently     No Known Allergies  Current Outpatient Medications  Medication Sig  Dispense Refill   acetaminophen (TYLENOL) 325 MG tablet Take 2 tablets (650 mg total) by mouth every 6 (six) hours as needed for mild pain or fever.     amLODipine (NORVASC) 10 MG tablet Take 1 tablet (10 mg total) by mouth daily. 30 tablet 2   aspirin EC 81 MG tablet Take 81 mg by mouth daily. Swallow whole.     clopidogrel (PLAVIX) 75 MG tablet Take 1 tablet (75 mg total) by mouth daily. 30 tablet 1   ferrous Q000111Q C-folic acid (TRINSICON / FOLTRIN) capsule Take 1 capsule by mouth 2 (two) times daily after a meal. 60 capsule 1   furosemide (LASIX) 40 MG tablet Take 1 tablet (40 mg total) by mouth daily. 14 tablet 0   Metoprolol Tartrate 37.5 MG TABS Take 37.5 mg by mouth 3 (three) times daily. 270 tablet 1   potassium chloride SA (KLOR-CON M) 20 MEQ tablet Take 1 tablet (20 mEq total) by mouth daily. 14 tablet 0   rosuvastatin (CRESTOR) 40 MG tablet Take 1 tablet (40 mg total) by mouth daily at 8 pm. 90 tablet  1   No current facility-administered medications for this visit.       Physical Exam: BP (!) 173/117 (BP Location: Right Arm, Patient Position: Sitting, Cuff Size: Normal)    Pulse 91    Resp 20    Ht 6' (1.829 m)    Wt 179 lb 3.2 oz (81.3 kg)    SpO2 94% Comment: RA   BMI 24.30 kg/m   General appearance: alert, cooperative, and no distress Heart: regular rate and rhythm Lungs: Mildly diminished in the left base Abdomen: Benign exam Extremities: Positive lower extremity edema Wound: Incisions well-healed without evidence of infection.   Diagnostic Studies & Laboratory data:     Recent Radiology Findings:   DG Chest 2 View  Result Date: 12/30/2021 CLINICAL DATA:  Post CABG follow-up EXAM: CHEST - 2 VIEW COMPARISON:  11/19/2021 FINDINGS: Persistent pleural effusions, left greater than right. Adjacent atelectasis. No pneumothorax. Stable cardiomediastinal contours. IMPRESSION: Persistent left greater than right pleural effusions with adjacent atelectasis.  Electronically Signed   By: Macy Mis M.D.   On: 12/30/2021 10:26      Recent Lab Findings: Lab Results  Component Value Date   WBC 9.5 11/28/2021   HGB 10.0 (L) 11/28/2021   HCT 31.8 (L) 11/28/2021   PLT 588 (H) 11/28/2021   GLUCOSE 138 (H) 12/01/2021   CHOL 46 11/14/2021   TRIG 49 11/14/2021   HDL 15 (L) 11/14/2021   LDLCALC 21 11/14/2021   ALT 17 11/12/2021   AST 34 11/12/2021   NA 142 12/01/2021   K 4.6 12/01/2021   CL 104 12/01/2021   CREATININE 1.28 (H) 12/01/2021   BUN 10 12/01/2021   CO2 23 12/01/2021   INR 1.6 (H) 11/13/2021   HGBA1C 5.6 11/12/2021      Assessment / Plan: The patient is status post above described procedure.  He is noted to be hypertensive on today's visit so I have started Norvasc 10 mg p.o. daily.  He states when he takes his blood pressure at home it is typically in the 0000000 systolic over [id number] range.  Additionally has lower extremity edema and small left effusion I will start on Lasix and potassium for 2 weeks 40/20.  We will see in the office with a repeat chest x-ray in 2 weeks.  Overall his progress has been pretty steady.      Medication Changes: Meds ordered this encounter  Medications   amLODipine (NORVASC) 10 MG tablet    Sig: Take 1 tablet (10 mg total) by mouth daily.    Dispense:  30 tablet    Refill:  2    Order Specific Question:   Supervising Provider    Answer:   Lajuana Matte [id number]   furosemide (LASIX) 40 MG tablet    Sig: Take 1 tablet (40 mg total) by mouth daily.    Dispense:  14 tablet    Refill:  0    Order Specific Question:   Supervising Provider    Answer:   Lajuana Matte [id number]   potassium chloride SA (KLOR-CON M) 20 MEQ tablet    Sig: Take 1 tablet (20 mEq total) by mouth daily.    Dispense:  14 tablet    Refill:  0    Order Specific Question:   Supervising Provider    Answer:   Lajuana Matte [id number]      John Giovanni, PA-C  12/30/2021 10:50 AM

## 2021-12-30 NOTE — Telephone Encounter (Signed)
Patient contacted the office requesting pick-up and discontinuation of Oxygen and supplies. Patient seen in the office today with minimal difficulties breathing and not wearing oxygen. Order sent to [facility] for O2 discontinuation. Patient acknowledged receipt.

## 2022-01-02 DIAGNOSIS — R69 Illness, unspecified: Secondary | ICD-10-CM | POA: Diagnosis not present

## 2022-01-02 DIAGNOSIS — I2510 Atherosclerotic heart disease of native coronary artery without angina pectoris: Secondary | ICD-10-CM | POA: Diagnosis not present

## 2022-01-02 DIAGNOSIS — Z7902 Long term (current) use of antithrombotics/antiplatelets: Secondary | ICD-10-CM | POA: Diagnosis not present

## 2022-01-02 DIAGNOSIS — M47814 Spondylosis without myelopathy or radiculopathy, thoracic region: Secondary | ICD-10-CM | POA: Diagnosis not present

## 2022-01-02 DIAGNOSIS — J432 Centrilobular emphysema: Secondary | ICD-10-CM | POA: Diagnosis not present

## 2022-01-02 DIAGNOSIS — E782 Mixed hyperlipidemia: Secondary | ICD-10-CM | POA: Diagnosis not present

## 2022-01-02 DIAGNOSIS — Z9181 History of falling: Secondary | ICD-10-CM | POA: Diagnosis not present

## 2022-01-02 DIAGNOSIS — Z7982 Long term (current) use of aspirin: Secondary | ICD-10-CM | POA: Diagnosis not present

## 2022-01-02 DIAGNOSIS — L89312 Pressure ulcer of right buttock, stage 2: Secondary | ICD-10-CM | POA: Diagnosis not present

## 2022-01-02 DIAGNOSIS — L89322 Pressure ulcer of left buttock, stage 2: Secondary | ICD-10-CM | POA: Diagnosis not present

## 2022-01-02 DIAGNOSIS — Z48812 Encounter for surgical aftercare following surgery on the circulatory system: Secondary | ICD-10-CM | POA: Diagnosis not present

## 2022-01-02 DIAGNOSIS — Z951 Presence of aortocoronary bypass graft: Secondary | ICD-10-CM | POA: Diagnosis not present

## 2022-01-02 DIAGNOSIS — Z9981 Dependence on supplemental oxygen: Secondary | ICD-10-CM | POA: Diagnosis not present

## 2022-01-02 DIAGNOSIS — I10 Essential (primary) hypertension: Secondary | ICD-10-CM | POA: Diagnosis not present

## 2022-01-02 DIAGNOSIS — F17210 Nicotine dependence, cigarettes, uncomplicated: Secondary | ICD-10-CM | POA: Diagnosis not present

## 2022-01-02 DIAGNOSIS — I214 Non-ST elevation (NSTEMI) myocardial infarction: Secondary | ICD-10-CM | POA: Diagnosis not present

## 2022-01-16 ENCOUNTER — Ambulatory Visit: Payer: Medicare HMO | Admitting: Thoracic Surgery (Cardiothoracic Vascular Surgery)

## 2022-01-16 ENCOUNTER — Other Ambulatory Visit: Payer: Self-pay | Admitting: Thoracic Surgery (Cardiothoracic Vascular Surgery)

## 2022-01-16 DIAGNOSIS — Z951 Presence of aortocoronary bypass graft: Secondary | ICD-10-CM

## 2022-01-20 ENCOUNTER — Ambulatory Visit (INDEPENDENT_AMBULATORY_CARE_PROVIDER_SITE_OTHER): Payer: Self-pay | Admitting: Physician Assistant

## 2022-01-20 ENCOUNTER — Encounter: Payer: Self-pay | Admitting: Physician Assistant

## 2022-01-20 ENCOUNTER — Ambulatory Visit
Admission: RE | Admit: 2022-01-20 | Discharge: 2022-01-20 | Disposition: A | Discharge status: Home / Self Care | Payer: Medicare HMO | Source: Ambulatory Visit | Attending: Thoracic Surgery (Cardiothoracic Vascular Surgery) | Admitting: Thoracic Surgery (Cardiothoracic Vascular Surgery)

## 2022-01-20 ENCOUNTER — Other Ambulatory Visit: Payer: Self-pay

## 2022-01-20 VITALS — BP 123/81 | HR 85 | Resp 20 | Wt 184.0 lb

## 2022-01-20 DIAGNOSIS — B351 Tinea unguium: Secondary | ICD-10-CM | POA: Diagnosis not present

## 2022-01-20 DIAGNOSIS — M79674 Pain in right toe(s): Secondary | ICD-10-CM | POA: Diagnosis not present

## 2022-01-20 DIAGNOSIS — S42001A Fracture of unspecified part of right clavicle, initial encounter for closed fracture: Secondary | ICD-10-CM | POA: Diagnosis not present

## 2022-01-20 DIAGNOSIS — Z951 Presence of aortocoronary bypass graft: Secondary | ICD-10-CM

## 2022-01-20 DIAGNOSIS — M79675 Pain in left toe(s): Secondary | ICD-10-CM | POA: Diagnosis not present

## 2022-01-20 DIAGNOSIS — J9811 Atelectasis: Secondary | ICD-10-CM | POA: Diagnosis not present

## 2022-01-20 DIAGNOSIS — J90 Pleural effusion, not elsewhere classified: Secondary | ICD-10-CM | POA: Diagnosis not present

## 2022-01-20 MED ORDER — ROSUVASTATIN CALCIUM 40 MG PO TABS: 40.0000 mg | ORAL_TABLET | Freq: Every day | ORAL | 3 refills | Status: DC

## 2022-01-20 MED ORDER — CLOPIDOGREL BISULFATE 75 MG PO TABS: 75.0000 mg | ORAL_TABLET | Freq: Every day | ORAL | 10 refills | Status: DC

## 2022-01-20 MED ORDER — AMLODIPINE BESYLATE 10 MG PO TABS: 10.0000 mg | ORAL_TABLET | Freq: Every day | ORAL | 5 refills | Status: AC

## 2022-01-20 NOTE — Progress Notes (Signed)
[address]       [place] [address]             [phone]       HPI: Dean Mendez is a very pleasant 71 year old gentleman past history of hypertension and stage III chronic kidney disease.  He presented to inpatient hospital back in November 2022 with acute non-ST elevation myocardial infarction discovered after a syncopal episode.  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  Intra-aortic balloon pump was placed in the Cath Lab.  He was taken the operating room emergently and underwent CABG x2 with LIMA to the LAD saphenous vein graft to ramus intermediate coronary artery by Dr. Kipp Brood.  Hiis post op course was unremarkable and he was discharged on home oxygen therapy on 12/4.   He was seen in the office for routine scheduled follow-up 2 weeks ago and was noted to have a left pleural effusion and some peripheral edema.  He has been treated with Lasix and potassium for the past 2 weeks. Patient returns for routine scheduled follow-up.    Overall, Dean Mendez says he has continued to improve.  He is walking for exercise on a regular basis.  He denies any shortness of breath and has not been using any supplemental oxygen for several weeks.  He denies any pain or palpitations.   Current Outpatient Medications  Medication Sig Dispense Refill   acetaminophen (TYLENOL) 325 MG tablet Take 2 tablets (650 mg total) by mouth every 6 (six) hours as needed for mild pain or fever.     amLODipine (NORVASC) 10 MG tablet Take 1 tablet (10 mg total) by mouth daily. 30 tablet 2   aspirin EC 81 MG tablet Take 81 mg by mouth daily. Swallow whole.     clopidogrel (PLAVIX) 75 MG tablet Take 1 tablet (75 mg total) by mouth daily. 30 tablet 1   ferrous Q000111Q C-folic acid (TRINSICON / FOLTRIN) capsule Take 1 capsule by  mouth 2 (two) times daily after a meal. 60 capsule 1   furosemide (LASIX) 40 MG tablet Take 1 tablet (40 mg total) by mouth daily. 14 tablet 0   Metoprolol Tartrate 37.5 MG TABS Take 37.5 mg by mouth 3 (three) times daily. 270 tablet 1   potassium chloride SA (KLOR-CON M) 20 MEQ tablet Take 1 tablet (20 mEq total) by mouth daily. 14 tablet 0   rosuvastatin (CRESTOR) 40 MG tablet Take 1 tablet (40 mg total) by mouth daily at 8 pm. 90 tablet 1   No current facility-administered medications for this visit.    Physical Exam: Vital signs BP 123/81 Pulse 85 Respirations 20 SPO2 95% on room air  General: Dean Mendez presented to the office today with his wife and companion.  He is in no distress.  He is walking with a steady gait.  Heart: Regular rate and rhythm, no murmur Chest: Breath sounds are full and clear to auscultation bilaterally.  Sternotomy incision is well-healed.  Sternum is stable.  Today's follow-up chest x-ray was reviewed and shows decrease in the size of the small left pleural effusion.  Lung fields are otherwise clear. Abdomen: Soft and nontender.  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 Extremities: All well perfused and without edema.  The left lower extremity open saphenous vein harvest incision is intact and  healing with no sign of complication.  Diagnostic Tests:  CLINICAL DATA:  CABG 11/12/2021.   EXAM: CHEST - 2 VIEW   COMPARISON:  12/30/2021   FINDINGS: Postop CABG. Heart size within normal limits. Negative for heart failure   Improving left pleural effusion which is now small. Mild left lower lobe atelectasis also with improvement.   Interval resolution of minimal pleural effusion from the prior study.   Chronic fracture deformity distal right clavicle.   IMPRESSION: Improving bilateral pleural effusions left greater than right. Improvement in left lower lobe atelectasis.     Electronically Signed   By: Franchot Gallo M.D.   On:  01/20/2022 14:50   Impression / Plan:    -Dean Mendez is now about 10 weeks post emergency two-vessel coronary bypass grafting.  He is progressing well.  Medications were reviewed.  I have asked him to continue clopidogrel for for bleeding.  Blood pressure is well controlled with the amlodipine.  Continue with the aspirin, Crestor, and metoprolol.  He does not need to continue with Lasix or potassium supplement.  -Dean Mendez may proceeding with cardiac rehab.  He also asked about timing of repair of his left inguinal hernia.  From cardiac surgery standpoint, he may proceed with hernia surgery at any time.  -Disposition: Referral to cardiac rehab is already been made.  I see no reason for it and further scheduled follow-up in our office but encouraged Dean Mendez and his wife to contact us if we could be of any further assistance in his care.   Dean Odea, PA-C [facility] [phone]

## 2022-01-20 NOTE — Patient Instructions (Signed)
-  Okay to proceed with cardiac rehab  -Okay to make plans for repair of your inguinal hernia at any time.  -May advance activity as tolerated.  No heavy lifting greater than 20 pounds for another 4 weeks.

## 2022-02-03 DIAGNOSIS — E782 Mixed hyperlipidemia: Secondary | ICD-10-CM | POA: Diagnosis not present

## 2022-02-03 DIAGNOSIS — I10 Essential (primary) hypertension: Secondary | ICD-10-CM | POA: Diagnosis not present

## 2022-02-03 DIAGNOSIS — E663 Overweight: Secondary | ICD-10-CM | POA: Diagnosis not present

## 2022-02-03 DIAGNOSIS — Z23 Encounter for immunization: Secondary | ICD-10-CM | POA: Diagnosis not present

## 2022-02-18 DIAGNOSIS — Z7902 Long term (current) use of antithrombotics/antiplatelets: Secondary | ICD-10-CM | POA: Diagnosis not present

## 2022-02-18 DIAGNOSIS — Z8249 Family history of ischemic heart disease and other diseases of the circulatory system: Secondary | ICD-10-CM | POA: Diagnosis not present

## 2022-02-18 DIAGNOSIS — Z87891 Personal history of nicotine dependence: Secondary | ICD-10-CM | POA: Diagnosis not present

## 2022-02-18 DIAGNOSIS — Z833 Family history of diabetes mellitus: Secondary | ICD-10-CM | POA: Diagnosis not present

## 2022-02-18 DIAGNOSIS — Z008 Encounter for other general examination: Secondary | ICD-10-CM | POA: Diagnosis not present

## 2022-02-18 DIAGNOSIS — I252 Old myocardial infarction: Secondary | ICD-10-CM | POA: Diagnosis not present

## 2022-02-18 DIAGNOSIS — M1990 Unspecified osteoarthritis, unspecified site: Secondary | ICD-10-CM | POA: Diagnosis not present

## 2022-02-18 DIAGNOSIS — I2510 Atherosclerotic heart disease of native coronary artery without angina pectoris: Secondary | ICD-10-CM | POA: Diagnosis not present

## 2022-02-18 DIAGNOSIS — R600 Localized edema: Secondary | ICD-10-CM | POA: Diagnosis not present

## 2022-02-18 DIAGNOSIS — I10 Essential (primary) hypertension: Secondary | ICD-10-CM | POA: Diagnosis not present

## 2022-02-18 DIAGNOSIS — E785 Hyperlipidemia, unspecified: Secondary | ICD-10-CM | POA: Diagnosis not present

## 2022-02-18 DIAGNOSIS — G8929 Other chronic pain: Secondary | ICD-10-CM | POA: Diagnosis not present

## 2022-03-13 ENCOUNTER — Encounter: Payer: Self-pay | Admitting: Cardiology

## 2022-03-13 ENCOUNTER — Ambulatory Visit: Payer: Medicare HMO | Admitting: Cardiology

## 2022-03-13 VITALS — BP 118/80 | HR 72 | Ht 72.0 in | Wt 194.2 lb

## 2022-03-13 DIAGNOSIS — I2510 Atherosclerotic heart disease of native coronary artery without angina pectoris: Secondary | ICD-10-CM

## 2022-03-13 DIAGNOSIS — I10 Essential (primary) hypertension: Secondary | ICD-10-CM

## 2022-03-13 NOTE — Patient Instructions (Signed)
Medication Instructions:  ?Stop Potassium.  ?Continue all other medications.    ? ?Labwork: ?none ? ?Testing/Procedures: ?none ? ?Follow-Up: ?6 months  ? ?Any Other Special Instructions Will Be Listed Below (If Applicable). ? ? ?If you need a refill on your cardiac medications before your next appointment, please call your pharmacy. ? ?

## 2022-03-13 NOTE — Progress Notes (Signed)
? ? ? ?Clinical Summary ?Mr. Hays is a 71 y.o.male ? ?1.CAD ?- admitted 10/2021 with NSTEMI ?- 10/2021 echo: LVEF 60-65%, no WMAs, grad I dd ?-10/2021 cath as reported below, PCI attempt complicated by LM dissection, referred for CABG ?- 11/13/2021 LIMA-LAD, RSVG-RI ? ?- occasiional left sided chest tightness, about once every 2 weeks. Often after eating breakfast.  ?-compliant with meds. Low bp's at discharge was not started on ACE/ARB, 11/2021 K was 5.6. Has not been started at this time.  ? ? ?2.HTN ?- compliant with meds ? ? ? ?3. Hyperlipidemia ?10/2021 TC 46 TG 49 HDL 15 LDL 21 ? ? ?4. Preoperative evaluatin ?- considering hernia surgery.  ? ? ?Past Medical History:  ?Diagnosis Date  ? Hypertension   ? ? ? ?No Known Allergies ? ? ?Current Outpatient Medications  ?Medication Sig Dispense Refill  ? acetaminophen (TYLENOL) 325 MG tablet Take 2 tablets (650 mg total) by mouth every 6 (six) hours as needed for mild pain or fever.    ? amLODipine (NORVASC) 10 MG tablet Take 1 tablet (10 mg total) by mouth daily. 30 tablet 5  ? aspirin EC 81 MG tablet Take 81 mg by mouth daily. Swallow whole.    ? clopidogrel (PLAVIX) 75 MG tablet Take 1 tablet (75 mg total) by mouth daily. 30 tablet 10  ? ferrous [id number] C-folic acid (TRINSICON / FOLTRIN) capsule Take 1 capsule by mouth 2 (two) times daily after a meal. 60 capsule 1  ? furosemide (LASIX) 40 MG tablet Take 1 tablet (40 mg total) by mouth daily. 14 tablet 0  ? Metoprolol Tartrate 37.5 MG TABS Take 37.5 mg by mouth 3 (three) times daily. 270 tablet 1  ? potassium chloride SA (KLOR-CON M) 20 MEQ tablet Take 1 tablet (20 mEq total) by mouth daily. 14 tablet 0  ? rosuvastatin (CRESTOR) 40 MG tablet Take 1 tablet (40 mg total) by mouth daily at 8 pm. 90 tablet 3  ? ?No current facility-administered medications for this visit.  ? ? ? ?Past Surgical History:  ?Procedure Laterality Date  ? CORONARY ARTERY BYPASS GRAFT N/A 11/12/2021  ? Procedure: CORONARY  ARTERY BYPASS GRAFTING (CABG) X TWO, USING LEFT INTERNAL MAMMARY ARTERY AND LEFT LEG GREATER SAPHENOUS VEIN - OPEN HARVEST;  Surgeon: Lajuana Matte, MD;  Location: [facility];  Service: Open Heart Surgery;  Laterality: N/A;  ? CORONARY BALLOON ANGIOPLASTY N/A 11/12/2021  ? Procedure: CORONARY BALLOON ANGIOPLASTY;  Surgeon: [name], MD;  Location: [place];  Service: Cardiovascular;  Laterality: N/A;  ? IABP INSERTION N/A 11/12/2021  ? Procedure: IABP INSERTION;  Surgeon: [name], MD;  Location: [place] [facility];  Service: Cardiovascular;  Laterality: N/A;  ? LEFT HEART CATH AND CORONARY ANGIOGRAPHY N/A 11/12/2021  ? Procedure: LEFT HEART CATH AND CORONARY ANGIOGRAPHY;  Surgeon: [name], MD;  Location: [place] [facility];  Service: Cardiovascular;  Laterality: N/A;  ? TEE WITHOUT CARDIOVERSION  11/12/2021  ? Procedure: TRANSESOPHAGEAL ECHOCARDIOGRAM (TEE);  Surgeon: Lajuana Matte, MD;  Location: [facility];  Service: Open Heart Surgery;;  ? ? ? ?No Known Allergies ? ? ? ?Family History  ?Problem Relation Age of Onset  ? Heart attack Brother   ? ? ? ?Social History ?Mr. Quest reports that he has been smoking cigarettes. He has been smoking an average of .25 packs per day. He has never used smokeless tobacco. ?Mr. Marcucci reports that he does not currently use alcohol. ? ? ?  Review of Systems ?CONSTITUTIONAL: No weight loss, fever, chills, weakness or fatigue.  ?HEENT: Eyes: No visual loss, blurred vision, double vision or yellow sclerae.No hearing loss, sneezing, congestion, runny nose or sore throat.  ?SKIN: No rash or itching.  ?CARDIOVASCULAR: per hpi ?RESPIRATORY: No shortness of breath, cough or sputum.  ?GASTROINTESTINAL: No anorexia, nausea, vomiting or diarrhea. No abdominal pain or blood.  ?GENITOURINARY: No burning on urination, no polyuria ?NEUROLOGICAL: No headache, dizziness, syncope, paralysis, ataxia, numbness or tingling in the extremities. No change in  bowel or bladder control.  ?MUSCULOSKELETAL: No muscle, back pain, joint pain or stiffness.  ?LYMPHATICS: No enlarged nodes. No history of splenectomy.  ?PSYCHIATRIC: No history of depression or anxiety.  ?ENDOCRINOLOGIC: No reports of sweating, cold or heat intolerance. No polyuria or polydipsia.  ?. ? ? ?Physical Examination ?Today's Vitals  ? 03/13/22 0949  ?BP: 118/80  ?Pulse: 72  ?SpO2: 96%  ?Weight: 194 lb 3.2 oz (88.1 kg)  ?Height: 6' (1.829 m)  ? ?Body mass index is 26.34 kg/m?. ? ?Gen: resting comfortably, no acute distress ?HEENT: no scleral icterus, pupils equal round and reactive, no palptable cervical adenopathy,  ?CV: RRR, no m/r/g no jvd ?Resp: Clear to auscultation bilaterally ?GI: abdomen is soft, non-tender, non-distended, normal bowel sounds, no hepatosplenomegaly ?MSK: extremities are warm, no edema.  ?Skin: warm, no rash ?Neuro:  no focal deficits ?Psych: appropriate affect ? ? ?Diagnostic Studies ?10/2021 echo ?1. Left ventricular ejection fraction, by estimation, is 60 to 65%. The  ?left ventricle has normal function. The left ventricle has no regional  ?wall motion abnormalities. There is moderate left ventricular hypertrophy.  ?Left ventricular diastolic  ?parameters are consistent with Grade I diastolic dysfunction (impaired  ?relaxation).  ? 2. Right ventricular systolic function is normal. The right ventricular  ?size is normal. There is normal pulmonary artery systolic pressure.  ? 3. The mitral valve is normal in structure. No evidence of mitral valve  ?regurgitation. No evidence of mitral stenosis.  ? 4. The aortic valve is tricuspid. Aortic valve regurgitation is not  ?visualized. No aortic stenosis is present.  ? 5. The inferior vena cava is normal in size with greater than 50%  ?respiratory variability, suggesting right atrial pressure of 3 mmHg.  ? ?10/2021 cath ?Ost LM to Prox LAD lesion is 30% stenosed with 80% stenosed side Gertrude Bucks in Ost Cx.  Post intervention, there is a 50%  residual stenosis with distal LM dissection.  Post attempted intervention, the OM side Makendra Vigeant was reduced to 80% residual stenosis. ?  Ramus-1 lesion is 40% stenosed. - mid 90%, distal mid thrombotic 90%. ?  Mid Cx to Dist Cx lesion is 95% stenosed. - plan was to attempt PCI ?  multiple wires were tried - unable to cross lesion ?  Post intervention, there is a 99% residual stenosis. TIMI 1 flow noted. ?  Prox RCA to Mid RCA lesion is 75% stenosed. non-dominant vessel ?  There is mild to moderate left ventricular systolic dysfunction.  The left ventricular ejection fraction is 45-50% by visual estimate. ?  LV end diastolic pressure is normal. ?  There is no aortic valve stenosis. ?  ?POST-OPERATIVE DIAGNOSIS:  ?Severe multivessel disease: Modest disease in the LAD, but heavily tortuous and calcified ramus intermedius as well as likely dominant LCx with severe disease : ?  ??Ramus takes a 90 degree turn shortly after that another 90 degree turn and there is a filling defect that appears to be either thrombotic or calcification.  The downstream vessel has pruning with what appears to be possible distal embolization. ??LCx is an unusual takeoff, there is diffuse moderate 70% disease followed by a hairpin turn where there is apparently an occluded OM 2 Zaniya Mcaulay.  There is a 99% stenosis at this lesion that is heavily calcified. ??RCA appears to be nondominant heavily diseased small caliber vessel. ??           Original plan was to attempt PTCA/PCI of the LCx with Aggrastat infusion overnight and planned attempted PCI of the ramus intermedius on Friday. ??Attempted PTCA of the circumflex was performed using Prowater, Choice PT and whisper wires.  The vessel was very difficult to wire and was not able to reach beyond the lesion.  Imaging after wire removal revealed that the vessel was now no longer subtotally occluded but appeared to have total occlusion with TIMI I flow distally.  At this point I chose to abort for further  procedures. ??           On continued to of post wiring angiography, there appears to be dye hang up in the left main into the aortic root concerning for possible dissection.   ??           Aggrastat b

## 2022-04-10 DIAGNOSIS — Z6828 Body mass index (BMI) 28.0-28.9, adult: Secondary | ICD-10-CM | POA: Diagnosis not present

## 2022-04-10 DIAGNOSIS — E663 Overweight: Secondary | ICD-10-CM | POA: Diagnosis not present

## 2022-04-10 DIAGNOSIS — R21 Rash and other nonspecific skin eruption: Secondary | ICD-10-CM | POA: Diagnosis not present

## 2022-04-16 DIAGNOSIS — B351 Tinea unguium: Secondary | ICD-10-CM | POA: Diagnosis not present

## 2022-04-16 DIAGNOSIS — M79675 Pain in left toe(s): Secondary | ICD-10-CM | POA: Diagnosis not present

## 2022-04-16 DIAGNOSIS — M79674 Pain in right toe(s): Secondary | ICD-10-CM | POA: Diagnosis not present

## 2022-06-25 DIAGNOSIS — B351 Tinea unguium: Secondary | ICD-10-CM | POA: Diagnosis not present

## 2022-06-25 DIAGNOSIS — M79674 Pain in right toe(s): Secondary | ICD-10-CM | POA: Diagnosis not present

## 2022-06-25 DIAGNOSIS — M79675 Pain in left toe(s): Secondary | ICD-10-CM | POA: Diagnosis not present

## 2022-07-25 ENCOUNTER — Other Ambulatory Visit (HOSPITAL_BASED_OUTPATIENT_CLINIC_OR_DEPARTMENT_OTHER): Payer: Self-pay | Admitting: Nurse Practitioner

## 2022-07-28 ENCOUNTER — Telehealth: Payer: Self-pay

## 2022-07-28 MED ORDER — METOPROLOL TARTRATE 37.5 MG PO TABS: 1.0000 | ORAL_TABLET | Freq: Three times a day (TID) | ORAL | 2 refills | Status: DC

## 2022-07-28 NOTE — Telephone Encounter (Signed)
Medication refill request approved for Metoprolol 37.5 mg tablets and sent to pharmacy.

## 2022-08-11 ENCOUNTER — Telehealth: Payer: Self-pay | Admitting: Cardiology

## 2022-08-11 NOTE — Telephone Encounter (Signed)
Spoke to pt who stated that bp has been running fine- pt had no readings with him as he was not home.   Will request last ov note from [facility]-

## 2022-08-11 NOTE — Telephone Encounter (Signed)
Patient stated that Dr. Phillips Odor had changed his blood pressure. He is now taking 37.5mg  of metoprolol once daily instead of the 3x daily that he was taking. He wanted Dr. Wyline Mood to be aware and, what his thoughts on this was.

## 2022-08-12 MED ORDER — METOPROLOL TARTRATE 37.5 MG PO TABS: 1.0000 | ORAL_TABLET | Freq: Two times a day (BID) | ORAL | 3 refills | Status: DC

## 2022-08-12 NOTE — Telephone Encounter (Signed)
Verified that pt is taking Metoprolol Tartrate 37.5 mg tablets TID via pharmacy.

## 2022-08-12 NOTE — Telephone Encounter (Signed)
Can we clarify with his pharmacy if that is metoprolol tartrate or succinate. Suspect perhaps pcp changed him to once a day succinate.    Dominga Ferry MD

## 2022-08-12 NOTE — Telephone Encounter (Signed)
Received ov note this afternoon- will route to provider.

## 2022-08-12 NOTE — Telephone Encounter (Signed)
Usually metoprolol tartrate is at least twice a day, can we get the clinic note from pcp  Dominga Ferry MD

## 2022-08-12 NOTE — Telephone Encounter (Signed)
Patient notified and verbalized understanding. Patient had no other questions or concerns at time.

## 2022-08-12 NOTE — Telephone Encounter (Signed)
Nothing in note mentions change, I would take metoprolol 37.5mg  bid. It only lasts 8-10 hours and should be taken at least twice a day. Once a day its in his system only half the day  Dominga Ferry MD

## 2022-09-15 ENCOUNTER — Encounter: Payer: Self-pay | Admitting: Cardiology

## 2022-09-15 ENCOUNTER — Ambulatory Visit: Payer: Medicare HMO | Attending: Cardiology | Admitting: Cardiology

## 2022-09-15 VITALS — BP 116/82 | HR 68 | Ht 72.0 in | Wt 211.6 lb

## 2022-09-15 DIAGNOSIS — I10 Essential (primary) hypertension: Secondary | ICD-10-CM | POA: Diagnosis not present

## 2022-09-15 DIAGNOSIS — I2510 Atherosclerotic heart disease of native coronary artery without angina pectoris: Secondary | ICD-10-CM

## 2022-09-15 MED ORDER — METOPROLOL TARTRATE 37.5 MG PO TABS: 1.0000 | ORAL_TABLET | Freq: Two times a day (BID) | ORAL | 1 refills | Status: DC

## 2022-09-15 NOTE — Patient Instructions (Addendum)
Medication Instructions:  Your physician has recommended you make the following change in your medication:  Take lopressor 37.5 mg twice a day Stop taking Plavix on 11/13/2022 Continue all other medications as directed  Labwork: none  Testing/Procedures: none  Follow-Up:  Your physician recommends that you schedule a follow-up appointment in: 6 months  Any Other Special Instructions Will Be Listed Below (If Applicable).  If you need a refill on your cardiac medications before your next appointment, please call your pharmacy.

## 2022-09-15 NOTE — Progress Notes (Signed)
Clinical Summary Mr. Heap is a 71 y.o.male seen today for follow up of the following medical problems.   1.CAD - admitted 10/2021 with NSTEMI - 10/2021 echo: LVEF 60-65%, no WMAs, grad I dd -10/2021 cath as reported below, PCI attempt complicated by LM dissection, referred for CABG - 11/13/2021 LIMA-LAD, RSVG-RI   - occasiional left sided chest tightness, about once every 2 weeks. Often after eating breakfast.  -compliant with meds. Low bp's at discharge was not started on ACE/ARB, 11/2021 K was 5.6. Has not been started at this time.    -no chest pains - some SOB which is chronic. Some LE edema at times.  - he is taking lopressor 37.5mg  tablet tid.      2.HTN - compliant with meds       3. Hyperlipidemia 10/2021 TC 46 TG 49 HDL 15 LDL 21     4. Preoperative evaluatin - considering hernia surgery.  - had been cleared from cardiac standpoint last visit however has not had surgery yet Past Medical History:  Diagnosis Date   Hypertension      No Known Allergies   Current Outpatient Medications  Medication Sig Dispense Refill   acetaminophen (TYLENOL) 325 MG tablet Take 2 tablets (650 mg total) by mouth every 6 (six) hours as needed for mild pain or fever.     amLODipine (NORVASC) 10 MG tablet Take 1 tablet (10 mg total) by mouth daily. 30 tablet 5   aspirin EC 81 MG tablet Take 81 mg by mouth daily. Swallow whole.     B Complex Vitamins (B COMPLEX-B12) TABS Take 1 tablet by mouth daily.     clopidogrel (PLAVIX) 75 MG tablet Take 1 tablet (75 mg total) by mouth daily. 30 tablet 10   ferrous [id number] C-folic acid (TRINSICON / FOLTRIN) capsule Take 1 capsule by mouth 2 (two) times daily after a meal. (Patient not taking: Reported on 03/13/2022) 60 capsule 1   furosemide (LASIX) 20 MG tablet Take 20 mg by mouth daily as needed.     Metoprolol Tartrate 37.5 MG TABS Take 1 tablet by mouth in the morning and at bedtime. 180 tablet 3   rosuvastatin  (CRESTOR) 40 MG tablet Take 1 tablet (40 mg total) by mouth daily at 8 pm. 90 tablet 3   traMADol (ULTRAM) 50 MG tablet Take 50 mg by mouth every 6 (six) hours as needed.     No current facility-administered medications for this visit.     Past Surgical History:  Procedure Laterality Date   CORONARY ARTERY BYPASS GRAFT N/A 11/12/2021   Procedure: CORONARY ARTERY BYPASS GRAFTING (CABG) X TWO, USING LEFT INTERNAL MAMMARY ARTERY AND LEFT LEG GREATER SAPHENOUS VEIN - OPEN HARVEST;  Surgeon: Lajuana Matte, MD;  Location: [facility];  Service: Open Heart Surgery;  Laterality: N/A;   CORONARY BALLOON ANGIOPLASTY N/A 11/12/2021   Procedure: CORONARY BALLOON ANGIOPLASTY;  Surgeon: [name], MD;  Location: [place] [facility];  Service: Cardiovascular;  Laterality: N/A;   IABP INSERTION N/A 11/12/2021   Procedure: IABP INSERTION;  Surgeon: [name], MD;  Location: [place] [facility];  Service: Cardiovascular;  Laterality: N/A;   LEFT HEART CATH AND CORONARY ANGIOGRAPHY N/A 11/12/2021   Procedure: LEFT HEART CATH AND CORONARY ANGIOGRAPHY;  Surgeon: [name], MD;  Location: [place] [facility];  Service: Cardiovascular;  Laterality: N/A;   TEE WITHOUT CARDIOVERSION  11/12/2021   Procedure: TRANSESOPHAGEAL ECHOCARDIOGRAM (TEE);  Surgeon: Kipp Brood,  Lucile Crater, MD;  Location: [facility];  Service: Open Heart Surgery;;     No Known Allergies    Family History  Problem Relation Age of Onset   Heart attack Brother      Social History Mr. Kenion reports that he quit smoking about 10 months ago. His smoking use included cigarettes. He smoked an average of .25 packs per day. He has never used smokeless tobacco. Mr. Hoggard reports that he does not currently use alcohol.   Review of Systems CONSTITUTIONAL: No weight loss, fever, chills, weakness or fatigue.  HEENT: Eyes: No visual loss, blurred vision, double vision or yellow sclerae.No hearing loss, sneezing, congestion,  runny nose or sore throat.  SKIN: No rash or itching.  CARDIOVASCULAR: per hpi RESPIRATORY: No shortness of breath, cough or sputum.  GASTROINTESTINAL: No anorexia, nausea, vomiting or diarrhea. No abdominal pain or blood.  GENITOURINARY: No burning on urination, no polyuria NEUROLOGICAL: No headache, dizziness, syncope, paralysis, ataxia, numbness or tingling in the extremities. No change in bowel or bladder control.  MUSCULOSKELETAL: No muscle, back pain, joint pain or stiffness.  LYMPHATICS: No enlarged nodes. No history of splenectomy.  PSYCHIATRIC: No history of depression or anxiety.  ENDOCRINOLOGIC: No reports of sweating, cold or heat intolerance. No polyuria or polydipsia.  Marland Kitchen   Physical Examination Today's Vitals   09/15/22 0900  BP: 116/82  Pulse: 68  SpO2: 97%  Weight: 211 lb 9.6 oz (96 kg)  Height: 6' (1.829 m)   Body mass index is 28.7 kg/m.  Gen: resting comfortably, no acute distress HEENT: no scleral icterus, pupils equal round and reactive, no palptable cervical adenopathy,  CV: RRR, no mrg, no jvd Resp: Clear to auscultation bilaterally GI: abdomen is soft, non-tender, non-distended, normal bowel sounds, no hepatosplenomegaly MSK: extremities are warm, no edema.  Skin: warm, no rash Neuro:  no focal deficits Psych: appropriate affect   Diagnostic Studies  10/2021 echo 1. Left ventricular ejection fraction, by estimation, is 60 to 65%. The  left ventricle has normal function. The left ventricle has no regional  wall motion abnormalities. There is moderate left ventricular hypertrophy.  Left ventricular diastolic  parameters are consistent with Grade I diastolic dysfunction (impaired  relaxation).   2. Right ventricular systolic function is normal. The right ventricular  size is normal. There is normal pulmonary artery systolic pressure.   3. The mitral valve is normal in structure. No evidence of mitral valve  regurgitation. No evidence of mitral  stenosis.   4. The aortic valve is tricuspid. Aortic valve regurgitation is not  visualized. No aortic stenosis is present.   5. The inferior vena cava is normal in size with greater than 50%  respiratory variability, suggesting right atrial pressure of 3 mmHg.    10/2021 cath Ost LM to Prox LAD lesion is 30% stenosed with 80% stenosed side Shamanda Len in Ost Cx.  Post intervention, there is a 50% residual stenosis with distal LM dissection.  Post attempted intervention, the OM side Ansen Sayegh was reduced to 80% residual stenosis.   Ramus-1 lesion is 40% stenosed. - mid 90%, distal mid thrombotic 90%.   Mid Cx to Dist Cx lesion is 95% stenosed. - plan was to attempt PCI   multiple wires were tried - unable to cross lesion   Post intervention, there is a 99% residual stenosis. TIMI 1 flow noted.   Prox RCA to Mid RCA lesion is 75% stenosed. non-dominant vessel   There is mild to moderate left ventricular systolic  dysfunction.  The left ventricular ejection fraction is 45-50% by visual estimate.   LV end diastolic pressure is normal.   There is no aortic valve stenosis.   POST-OPERATIVE DIAGNOSIS:  Severe multivessel disease: Modest disease in the LAD, but heavily tortuous and calcified ramus intermedius as well as likely dominant LCx with severe disease :   Ramus takes a 90 degree turn shortly after that another 90 degree turn and there is a filling defect that appears to be either thrombotic or calcification.  The downstream vessel has pruning with what appears to be possible distal embolization. LCx is an unusual takeoff, there is diffuse moderate 70% disease followed by a hairpin turn where there is apparently an occluded OM 2 Jaleena Viviani.  There is a 99% stenosis at this lesion that is heavily calcified. RCA appears to be nondominant heavily diseased small caliber vessel.            Original plan was to attempt PTCA/PCI of the LCx with Aggrastat infusion overnight and planned attempted PCI of the  ramus intermedius on Friday. Attempted PTCA of the circumflex was performed using Prowater, Choice PT and whisper wires.  The vessel was very difficult to wire and was not able to reach beyond the lesion.  Imaging after wire removal revealed that the vessel was now no longer subtotally occluded but appeared to have total occlusion with TIMI I flow distally.  At this point I chose to abort for further procedures.            On continued to of post wiring angiography, there appears to be dye hang up in the left main into the aortic root concerning for possible dissection.              Aggrastat bolus had been given but was discontinued.  CVTS consult called for potential emergent surgery.    EF appears to be 45 to 50%.  There appears to be apical inferior and mid to distal lateral-inferolateral hypokinesis. Normal LVEDP.   PATIENT DISPOSITION:  PACU - hemodynamically stable.            Stat echocardiogram called            CVTS consult call with Dr. Kipp Brood -> likely emergent surgery.  Would like to consider CABG.            Antiplatelet agents have not stopped.     PLAN OF CARE:  The patient has been already been admitted to 6 E., however, the plan after discussion with Dr. Kipp Brood is to place IABP & go to OR     Assessment and Plan  1.CAD - doing well s/p CABG - can d/c plavix 11/13/22 - was not started on ACE/ARB at d/c due to low bp's and AKI, more recently had elevated potassium level. Have not started at this time, perhaps at next f/u pending pcp labs. Should be on lopressor 37.5mg  bid, has been taking tid. Will adjust Rx.    2. HTN - at goal, continue current meds   3. Preoperative evaluation - ok to proceed with hernia surgery, would need to hold plavix 5 days prior and resume day after. Would recommend contuining ASA   Request pcp labs     Arnoldo Lenis, M.D.

## 2022-09-17 DIAGNOSIS — M79675 Pain in left toe(s): Secondary | ICD-10-CM | POA: Diagnosis not present

## 2022-09-17 DIAGNOSIS — M79674 Pain in right toe(s): Secondary | ICD-10-CM | POA: Diagnosis not present

## 2022-09-17 DIAGNOSIS — B351 Tinea unguium: Secondary | ICD-10-CM | POA: Diagnosis not present

## 2022-09-22 ENCOUNTER — Other Ambulatory Visit: Payer: Self-pay | Admitting: Physician Assistant

## 2022-09-24 ENCOUNTER — Other Ambulatory Visit: Payer: Self-pay | Admitting: Physician Assistant

## 2022-10-02 DIAGNOSIS — Z125 Encounter for screening for malignant neoplasm of prostate: Secondary | ICD-10-CM | POA: Diagnosis not present

## 2022-10-02 DIAGNOSIS — E7849 Other hyperlipidemia: Secondary | ICD-10-CM | POA: Diagnosis not present

## 2022-10-02 DIAGNOSIS — I2510 Atherosclerotic heart disease of native coronary artery without angina pectoris: Secondary | ICD-10-CM | POA: Diagnosis not present

## 2022-10-02 DIAGNOSIS — N183 Chronic kidney disease, stage 3 unspecified: Secondary | ICD-10-CM | POA: Diagnosis not present

## 2022-10-02 DIAGNOSIS — I10 Essential (primary) hypertension: Secondary | ICD-10-CM | POA: Diagnosis not present

## 2022-10-02 DIAGNOSIS — E782 Mixed hyperlipidemia: Secondary | ICD-10-CM | POA: Diagnosis not present

## 2022-10-02 DIAGNOSIS — E119 Type 2 diabetes mellitus without complications: Secondary | ICD-10-CM | POA: Diagnosis not present

## 2022-10-02 DIAGNOSIS — Z23 Encounter for immunization: Secondary | ICD-10-CM | POA: Diagnosis not present

## 2022-10-02 DIAGNOSIS — E663 Overweight: Secondary | ICD-10-CM | POA: Diagnosis not present

## 2022-10-02 DIAGNOSIS — Z0001 Encounter for general adult medical examination with abnormal findings: Secondary | ICD-10-CM | POA: Diagnosis not present

## 2022-10-02 DIAGNOSIS — Z6829 Body mass index (BMI) 29.0-29.9, adult: Secondary | ICD-10-CM | POA: Diagnosis not present

## 2022-10-02 DIAGNOSIS — Z1331 Encounter for screening for depression: Secondary | ICD-10-CM | POA: Diagnosis not present

## 2022-10-07 DIAGNOSIS — Z1211 Encounter for screening for malignant neoplasm of colon: Secondary | ICD-10-CM | POA: Diagnosis not present

## 2022-11-26 DIAGNOSIS — B351 Tinea unguium: Secondary | ICD-10-CM | POA: Diagnosis not present

## 2022-11-26 DIAGNOSIS — M79674 Pain in right toe(s): Secondary | ICD-10-CM | POA: Diagnosis not present

## 2022-11-26 DIAGNOSIS — M79675 Pain in left toe(s): Secondary | ICD-10-CM | POA: Diagnosis not present

## 2022-12-07 DIAGNOSIS — E6609 Other obesity due to excess calories: Secondary | ICD-10-CM | POA: Diagnosis not present

## 2022-12-07 DIAGNOSIS — L089 Local infection of the skin and subcutaneous tissue, unspecified: Secondary | ICD-10-CM | POA: Diagnosis not present

## 2022-12-07 DIAGNOSIS — S61459A Open bite of unspecified hand, initial encounter: Secondary | ICD-10-CM | POA: Diagnosis not present

## 2022-12-07 DIAGNOSIS — R590 Localized enlarged lymph nodes: Secondary | ICD-10-CM | POA: Diagnosis not present

## 2022-12-07 DIAGNOSIS — Z6830 Body mass index (BMI) 30.0-30.9, adult: Secondary | ICD-10-CM | POA: Diagnosis not present

## 2022-12-08 DIAGNOSIS — S61459A Open bite of unspecified hand, initial encounter: Secondary | ICD-10-CM | POA: Diagnosis not present

## 2023-01-08 ENCOUNTER — Other Ambulatory Visit: Payer: Self-pay

## 2023-01-08 ENCOUNTER — Emergency Department (HOSPITAL_COMMUNITY)
Admission: EM | Admit: 2023-01-08 | Discharge: 2023-01-09 | Disposition: A | Discharge status: Home / Self Care | Payer: Medicare HMO | Attending: Emergency Medicine | Admitting: Emergency Medicine

## 2023-01-08 ENCOUNTER — Encounter (HOSPITAL_COMMUNITY): Payer: Self-pay

## 2023-01-08 DIAGNOSIS — I10 Essential (primary) hypertension: Secondary | ICD-10-CM | POA: Diagnosis not present

## 2023-01-08 DIAGNOSIS — E876 Hypokalemia: Secondary | ICD-10-CM | POA: Diagnosis not present

## 2023-01-08 DIAGNOSIS — Z7902 Long term (current) use of antithrombotics/antiplatelets: Secondary | ICD-10-CM | POA: Diagnosis not present

## 2023-01-08 DIAGNOSIS — R7401 Elevation of levels of liver transaminase levels: Secondary | ICD-10-CM | POA: Insufficient documentation

## 2023-01-08 DIAGNOSIS — Z7982 Long term (current) use of aspirin: Secondary | ICD-10-CM | POA: Diagnosis not present

## 2023-01-08 DIAGNOSIS — R61 Generalized hyperhidrosis: Secondary | ICD-10-CM | POA: Insufficient documentation

## 2023-01-08 DIAGNOSIS — R7309 Other abnormal glucose: Secondary | ICD-10-CM | POA: Diagnosis not present

## 2023-01-08 DIAGNOSIS — R079 Chest pain, unspecified: Secondary | ICD-10-CM

## 2023-01-08 DIAGNOSIS — N289 Disorder of kidney and ureter, unspecified: Secondary | ICD-10-CM | POA: Diagnosis not present

## 2023-01-08 DIAGNOSIS — Z79899 Other long term (current) drug therapy: Secondary | ICD-10-CM | POA: Insufficient documentation

## 2023-01-08 DIAGNOSIS — Z951 Presence of aortocoronary bypass graft: Secondary | ICD-10-CM | POA: Insufficient documentation

## 2023-01-08 DIAGNOSIS — J449 Chronic obstructive pulmonary disease, unspecified: Secondary | ICD-10-CM | POA: Insufficient documentation

## 2023-01-08 DIAGNOSIS — R0789 Other chest pain: Secondary | ICD-10-CM | POA: Insufficient documentation

## 2023-01-08 DIAGNOSIS — M545 Low back pain, unspecified: Secondary | ICD-10-CM | POA: Insufficient documentation

## 2023-01-08 DIAGNOSIS — R1033 Periumbilical pain: Secondary | ICD-10-CM | POA: Diagnosis not present

## 2023-01-08 DIAGNOSIS — I2510 Atherosclerotic heart disease of native coronary artery without angina pectoris: Secondary | ICD-10-CM | POA: Insufficient documentation

## 2023-01-08 HISTORY — DX: Hyperlipidemia, unspecified: E78.5

## 2023-01-08 NOTE — ED Triage Notes (Signed)
Pt arrived from home via POV c/o CP and abdominal painthat began earlier this evening. Pt denies injury but endorses mild nausea. Pt reports cardiac Hx.

## 2023-01-09 ENCOUNTER — Emergency Department (HOSPITAL_COMMUNITY): Payer: Medicare HMO

## 2023-01-09 ENCOUNTER — Other Ambulatory Visit (HOSPITAL_COMMUNITY): Payer: Medicare HMO

## 2023-01-09 DIAGNOSIS — J449 Chronic obstructive pulmonary disease, unspecified: Secondary | ICD-10-CM | POA: Diagnosis not present

## 2023-01-09 DIAGNOSIS — R079 Chest pain, unspecified: Secondary | ICD-10-CM | POA: Diagnosis not present

## 2023-01-09 LAB — HEPATIC FUNCTION PANEL
ALT: 31 U/L (ref 0–44)
AST: 48 U/L — ABNORMAL HIGH (ref 15–41)
Albumin: 4.3 g/dL (ref 3.5–5.0)
Alkaline Phosphatase: 83 U/L (ref 38–126)
Bilirubin, Direct: 0.3 mg/dL — ABNORMAL HIGH (ref 0.0–0.2)
Indirect Bilirubin: 0.7 mg/dL (ref 0.3–0.9)
Total Bilirubin: 1 mg/dL (ref 0.3–1.2)
Total Protein: 7.9 g/dL (ref 6.5–8.1)

## 2023-01-09 LAB — CBC WITH DIFFERENTIAL/PLATELET
Abs Immature Granulocytes: 0.02 10*3/uL (ref 0.00–0.07)
Basophils Absolute: 0.1 10*3/uL (ref 0.0–0.1)
Basophils Relative: 1 %
Eosinophils Absolute: 0.3 10*3/uL (ref 0.0–0.5)
Eosinophils Relative: 4 %
HCT: 48.9 % (ref 39.0–52.0)
Hemoglobin: 15.5 g/dL (ref 13.0–17.0)
Immature Granulocytes: 0 %
Lymphocytes Relative: 23 %
Lymphs Abs: 1.9 10*3/uL (ref 0.7–4.0)
MCH: 26.5 pg (ref 26.0–34.0)
MCHC: 31.7 g/dL (ref 30.0–36.0)
MCV: 83.7 fL (ref 80.0–100.0)
Monocytes Absolute: 0.7 10*3/uL (ref 0.1–1.0)
Monocytes Relative: 9 %
Neutro Abs: 5.3 10*3/uL (ref 1.7–7.7)
Neutrophils Relative %: 63 %
Platelets: 273 10*3/uL (ref 150–400)
RBC: 5.84 MIL/uL — ABNORMAL HIGH (ref 4.22–5.81)
RDW: 16.7 % — ABNORMAL HIGH (ref 11.5–15.5)
WBC: 8.4 10*3/uL (ref 4.0–10.5)
nRBC: 0 % (ref 0.0–0.2)

## 2023-01-09 LAB — TROPONIN I (HIGH SENSITIVITY)
Troponin I (High Sensitivity): 7 ng/L (ref <18)
Troponin I (High Sensitivity): 8 ng/L (ref <18)

## 2023-01-09 LAB — BASIC METABOLIC PANEL
Anion gap: 11 (ref 5–15)
BUN: 24 mg/dL — ABNORMAL HIGH (ref 8–23)
CO2: 26 mmol/L (ref 22–32)
Calcium: 9.2 mg/dL (ref 8.9–10.3)
Chloride: 102 mmol/L (ref 98–111)
Creatinine, Ser: 1.51 mg/dL — ABNORMAL HIGH (ref 0.61–1.24)
GFR, Estimated: 50 mL/min — ABNORMAL LOW (ref >60)
Glucose, Bld: 119 mg/dL — ABNORMAL HIGH (ref 70–99)
Potassium: 3.4 mmol/L — ABNORMAL LOW (ref 3.5–5.1)
Sodium: 139 mmol/L (ref 135–145)

## 2023-01-09 LAB — LIPASE, BLOOD: Lipase: 30 U/L (ref 11–51)

## 2023-01-09 MED ORDER — POTASSIUM CHLORIDE CRYS ER 20 MEQ PO TBCR
40.0000 meq | EXTENDED_RELEASE_TABLET | Freq: Once | ORAL | Status: AC
  Administered 2023-01-09: 40 meq via ORAL
  Filled 2023-01-09: qty 2

## 2023-01-09 MED ORDER — POTASSIUM CHLORIDE CRYS ER 20 MEQ PO TBCR: 20.0000 meq | EXTENDED_RELEASE_TABLET | Freq: Two times a day (BID) | ORAL | 0 refills | Status: DC

## 2023-01-09 NOTE — Discharge Instructions (Addendum)
Return if symptoms are getting worse. °

## 2023-01-09 NOTE — ED Notes (Signed)
X-Ray at bedside.

## 2023-01-09 NOTE — ED Provider Notes (Signed)
[facility] Provider Note   CSN: [id number] Arrival date & time: 01/08/23  2335     History  Chief Complaint  Patient presents with   Chest Pain    Dean Mendez is a 72 y.o. male.  The history is provided by the patient.  Chest Pain He has history of hypertension, hyperlipidemia, coronary artery disease status post coronary artery bypass and comes in complaining of abdominal pain, chest pain, back pain which started about 3 hours ago.  Pain is in the periumbilical area and radiating up to the midsternal area and into the midline of the lower back and up to the interscapular area.  Pain was dull and was associated with some dyspnea and diaphoresis but no nausea.  Since arriving in the emergency department, pain has completely resolved.  Pain is very different from what he had prior to his bypass surgery.  When pain was present, nothing seem to make it better, nothing made it worse.   Home Medications Prior to Admission medications   Medication Sig Start Date End Date Taking? Authorizing Provider  acetaminophen (TYLENOL) 500 MG tablet Take 500 mg by mouth every 6 (six) hours as needed.    [provider]  amLODipine (NORVASC) 10 MG tablet Take 1 tablet (10 mg total) by mouth daily. 01/20/22   Antony Odea, PA-C  aspirin EC 81 MG tablet Take 81 mg by mouth daily. Swallow whole.    [provider]  B Complex Vitamins (B COMPLEX-B12) TABS Take 1 tablet by mouth daily.    [provider]  clopidogrel (PLAVIX) 75 MG tablet Take 1 tablet (75 mg total) by mouth daily. 01/20/22   Antony Odea, PA-C  furosemide (LASIX) 20 MG tablet Take 20 mg by mouth daily as needed.    [provider]  Metoprolol Tartrate 37.5 MG TABS Take 1 tablet by mouth in the morning and at bedtime. 09/15/22   Arnoldo Lenis, MD  Pediatric Multivitamins-Iron (FLINTSTONES COMPLETE) 10 MG CHEW Chew 1 tablet by mouth  daily.    [provider]  rosuvastatin (CRESTOR) 40 MG tablet Take 1 tablet (40 mg total) by mouth daily at 8 pm. 01/20/22   Antony Odea, PA-C      Allergies    Patient has no known allergies.    Review of Systems   Review of Systems  Cardiovascular:  Positive for chest pain.  All other systems reviewed and are negative.   Physical Exam Updated Vital Signs BP 134/81   Pulse 81   Temp 97.6 F (36.4 C) (Oral)   Resp (!) 23   Ht 6' (1.829 m)   Wt 95.3 kg   SpO2 91%   BMI 28.48 kg/m  Physical Exam Vitals and nursing note reviewed.   72 year old male, resting comfortably and in no acute distress. Vital signs are significant for slightly elevated respiratory rate. Oxygen saturation is 91%, which is normal. Head is normocephalic and atraumatic. PERRLA, EOMI. Oropharynx is clear. Neck is nontender and supple without adenopathy or JVD. Back is nontender and there is no CVA tenderness. Lungs are clear without rales, wheezes, or rhonchi. Chest is nontender. Heart has regular rate and rhythm without murmur. Abdomen is soft, flat, nontender. Extremities have no cyanosis or edema, full range of motion is present. Skin is warm and dry without rash. Neurologic: Mental status is normal, cranial nerves are intact, moves all extremities equally.  ED Results / Procedures /  Treatments   Labs (all labs ordered are listed, but only abnormal results are displayed) Labs Reviewed  BASIC METABOLIC PANEL - Abnormal; Notable for the following components:      Result Value   Potassium 3.4 (*)    Glucose, Bld 119 (*)    BUN 24 (*)    Creatinine, Ser 1.51 (*)    GFR, Estimated 50 (*)    All other components within normal limits  CBC WITH DIFFERENTIAL/PLATELET - Abnormal; Notable for the following components:   RBC 5.84 (*)    RDW 16.7 (*)    All other components within normal limits  TROPONIN I (HIGH SENSITIVITY)    EKG EKG Interpretation  Date/Time:  Friday January 08 2023 23:48:18 EST Ventricular Rate:  84 PR Interval:  196 QRS Duration: 102 QT Interval:  382 QTC Calculation: 452 R Axis:   -37 Text Interpretation: Sinus rhythm Probable left atrial enlargement Left axis deviation Abnormal R-wave progression, late transition When compared with ECG of 11/16/2021, No significant change was found Confirmed by Dione Booze ([id number]) on 01/09/2023 12:28:51 AM  Radiology DG Chest Port 1 View  Result Date: 01/09/2023 CLINICAL DATA:  Chest pain EXAM: PORTABLE CHEST 1 VIEW COMPARISON:  01/20/2022 FINDINGS: Hyperinflated lungs suggest COPD. Linear scar or subsegmental atelectasis left base. Cardiac silhouette unremarkable. Aorta is tortuous and calcified. Patient is status post median sternotomy and CABG. No pneumothorax identified. There could be small left-sided pleural effusion. Normal pulmonary vasculature. IMPRESSION: COPD. Left base subsegmental atelectasis or scarring. Possible small left-sided pleural effusion. Electronically Signed   By: Layla Maw M.D.   On: 01/09/2023 00:14    Procedures Procedures  Cardiac monitor shows normal sinus rhythm, per my interpretation.  Medications Ordered in ED Medications - No data to display  ED Course/ Medical Decision Making/ A&P                             Medical Decision Making Amount and/or Complexity of Data Reviewed Labs: ordered. Radiology: ordered.  Risk Prescription drug management.   Chest, abdomen, back pain which has completely resolved.  It seems that the center of pain really was abdomen rather than chest or back.  Differential diagnosis includes, but is not limited to, ACS, diverticulitis, abdominal aortic aneurysm.  I have reviewed and interpreted his electrocardiogram and my interpretation is abnormal R wave progression but no ST or T changes and unchanged from prior ECG.  Chest x-ray shows COPD with atelectasis or scarring of the left base.  I have independently viewed the image, and agree  with the radiologist's interpretation.  I have reviewed and interpreted his laboratory tests, and my interpretation is renal insufficiency slightly worse than on 12/01/2021, mild hypokalemia, mildly elevated random glucose, normal CBC including normal differential, normal initial troponin.  I have ordered additional laboratory testing of hepatic function panel, lipase as well as repeat troponin.  I have reviewed his past records, and I note coronary artery bypass graft on 11/12/2021.  Cardiology office visit on 09/15/2022 noted good progress with no symptomatic coronary artery disease.  CT angiogram of the chest on 11/11/2021 showed no evidence of thoracic aortic aneurysm, CT of abdomen and pelvis on 07/09/2014 showed no evidence of abdominal aortic aneurysm.  With pain completely resolved now, I do not see any indication for advanced imaging.  I have ordered a dose of oral potassium.  I have reviewed the additional laboratory tests and my interpretation is mild  elevation of AST which is not felt to be clinically significant, normal lipase.  Repeat troponin is normal.  No evidence of ACS.  He continues to be pain-free.  I am discharging him with a prescription for oral potassium.  Return precautions discussed.  Final Clinical Impression(s) / ED Diagnoses Final diagnoses:  Nonspecific chest pain  Periumbilical abdominal pain  Hypokalemia  Renal insufficiency  Elevated AST (SGOT)  Elevated random blood glucose level    Rx / DC Orders ED Discharge Orders          Ordered    potassium chloride SA (KLOR-CON M) 20 MEQ tablet  2 times daily        01/09/23 2440              Delora Fuel, MD 10/16/24 [phone]

## 2023-01-30 ENCOUNTER — Other Ambulatory Visit: Payer: Self-pay | Admitting: Physician Assistant

## 2023-02-01 ENCOUNTER — Other Ambulatory Visit: Payer: Self-pay | Admitting: *Deleted

## 2023-02-01 NOTE — Telephone Encounter (Signed)
Recent lab work (01/08/2023) show elevation in lever enzymes Please advise on refill request from crestor 40 mg

## 2023-02-03 MED ORDER — ROSUVASTATIN CALCIUM 40 MG PO TABS: 40.0000 mg | ORAL_TABLET | Freq: Every day | ORAL | 0 refills | Status: DC

## 2023-02-03 NOTE — Telephone Encounter (Signed)
Very slight elevation of liver enzymes, would continue crestor as prescribed.   Zandra Abts MD

## 2023-02-04 DIAGNOSIS — M79675 Pain in left toe(s): Secondary | ICD-10-CM | POA: Diagnosis not present

## 2023-02-04 DIAGNOSIS — M79674 Pain in right toe(s): Secondary | ICD-10-CM | POA: Diagnosis not present

## 2023-02-04 DIAGNOSIS — B351 Tinea unguium: Secondary | ICD-10-CM | POA: Diagnosis not present

## 2023-03-11 ENCOUNTER — Ambulatory Visit: Payer: Medicare HMO | Attending: Nurse Practitioner | Admitting: Nurse Practitioner

## 2023-03-11 ENCOUNTER — Encounter: Payer: Self-pay | Admitting: Nurse Practitioner

## 2023-03-11 VITALS — BP 136/90 | HR 81 | Ht 72.0 in | Wt 217.6 lb

## 2023-03-11 DIAGNOSIS — I2510 Atherosclerotic heart disease of native coronary artery without angina pectoris: Secondary | ICD-10-CM

## 2023-03-11 DIAGNOSIS — R0602 Shortness of breath: Secondary | ICD-10-CM

## 2023-03-11 DIAGNOSIS — Z79899 Other long term (current) drug therapy: Secondary | ICD-10-CM | POA: Diagnosis not present

## 2023-03-11 DIAGNOSIS — E785 Hyperlipidemia, unspecified: Secondary | ICD-10-CM

## 2023-03-11 DIAGNOSIS — R0609 Other forms of dyspnea: Secondary | ICD-10-CM

## 2023-03-11 DIAGNOSIS — I10 Essential (primary) hypertension: Secondary | ICD-10-CM

## 2023-03-11 NOTE — Progress Notes (Addendum)
Office Visit    Patient Name: Dean Mendez Date of Encounter: 03/11/2023  PCP:  [name], MD   [facility]  Cardiologist:  Carlyle Dolly, MD  Advanced Practice Provider:  Finis Bud, NP Electrophysiologist:  None 46}  Chief Complaint    Dean Mendez is a 72 y.o. male with a hx of CAD, s/p NSTEMI and CABG in 2022, hyperlipidemia, hypertension, history of chest pain, who presents today for 88-month follow-up.  Past Medical History    Past Medical History:  Diagnosis Date   CAD (coronary artery disease)    DOE (dyspnea on exertion)    Hyperlipidemia    Hypertension    S/P CABG (coronary artery bypass graft)    2022   Past Surgical History:  Procedure Laterality Date   CORONARY ARTERY BYPASS GRAFT N/A 11/12/2021   Procedure: CORONARY ARTERY BYPASS GRAFTING (CABG) X TWO, USING LEFT INTERNAL MAMMARY ARTERY AND LEFT LEG GREATER SAPHENOUS VEIN - OPEN HARVEST;  Surgeon: Lajuana Matte, MD;  Location: [facility];  Service: Open Heart Surgery;  Laterality: N/A;   CORONARY BALLOON ANGIOPLASTY N/A 11/12/2021   Procedure: CORONARY BALLOON ANGIOPLASTY;  Surgeon: [name], MD;  Location: [facility];  Service: Cardiovascular;  Laterality: N/A;   IABP INSERTION N/A 11/12/2021   Procedure: IABP INSERTION;  Surgeon: [name], MD;  Location: [facility];  Service: Cardiovascular;  Laterality: N/A;   LEFT HEART CATH AND CORONARY ANGIOGRAPHY N/A 11/12/2021   Procedure: LEFT HEART CATH AND CORONARY ANGIOGRAPHY;  Surgeon: [name], MD;  Location: [facility];  Service: Cardiovascular;  Laterality: N/A;   TEE WITHOUT CARDIOVERSION  11/12/2021   Procedure: TRANSESOPHAGEAL ECHOCARDIOGRAM (TEE);  Surgeon: Lajuana Matte, MD;  Location: [facility];  Service: Open Heart Surgery;;    Allergies  No Known Allergies  History of Present Illness    Dean Mendez is a 72 y.o. male with a PMH as  mentioned above.  Previous cardiovascular history includes NSTEMI in 2022, TTE revealed EF 60 to 65%, grade 1 DD, PCI attempt complicated by LM dissection, referred for CABG.  Underwent CABG later on, R SVG-RI, LIMA-LAD.  Last seen by Dr. Carlyle Dolly on September 15, 2022.  Was doing well post CABG.  Planned to D/C Plavix in November 2023.  Was not started on ACE/ARB due to AKI/low BPs and elevated potassium level.  Dr. Harl Bowie recommended to perhaps restart ACE/ARB at next follow-up pending lab work.  Was pending upcoming hernia surgery at the time, instructed to hold Plavix 5 days prior and resume day after.  Evaluated AP 12/2022 for chest pain.  EKG negative for ST/T wave changes.  CXR showed COPD with atelectasis/scarring at left lung base.  Was given oral potassium due to hypokalemia.  Cardiac workup unremarkable, no evidence of ACS.  Today he presents for 48-month follow-up.  He states that he is doing well. Admits to DOE for the past 2 months, seems to be getting better recently. Denies any chest pain, palpitations, syncope, presyncope, dizziness, orthopnea, PND, swelling or significant weight changes, acute bleeding, or claudication.  EKGs/Labs/Other Studies Reviewed:   The following studies were reviewed today:   EKG:  EKG is not ordered today.    Echo 10/2021: 1. Left ventricular ejection fraction, by estimation, is 55 to 60%. The  left ventricle has normal function. The left ventricle demonstrates  regional wall motion abnormalities (see scoring diagram/findings for  description). There  is mild left ventricular   hypertrophy. Left ventricular diastolic parameters are indeterminate.   2. Right ventricular systolic function is normal. The right ventricular  size is normal. Tricuspid regurgitation signal is inadequate for assessing  PA pressure.   3. There is a trivial pericardial effusion posterior to the left  ventricle and anterior to the right ventricle.   4. The mitral valve  is grossly normal. Trivial mitral valve  regurgitation.   5. The aortic valve is tricuspid. Aortic valve regurgitation is not  visualized. Aortic valve sclerosis is present, with no evidence of aortic  valve stenosis.   6. The inferior vena cava is normal in size with greater than 50%  respiratory variability, suggesting right atrial pressure of 3 mmHg.  LHC 10/2021:   Ost LM to Prox LAD lesion is 30% stenosed with 80% stenosed side branch in Ost Cx.  Post intervention, there is a 50% residual stenosis with distal LM dissection.  Post attempted intervention, the OM side branch was reduced to 80% residual stenosis.   Ramus-1 lesion is 40% stenosed. - mid 90%, distal mid thrombotic 90%.   Mid Cx to Dist Cx lesion is 95% stenosed. - plan was to attempt PCI   multiple wires were tried - unable to cross lesion   Post intervention, there is a 99% residual stenosis. TIMI 1 flow noted.   Prox RCA to Mid RCA lesion is 75% stenosed. non-dominant vessel   There is mild to moderate left ventricular systolic dysfunction.  The left ventricular ejection fraction is 45-50% by visual estimate.   LV end diastolic pressure is normal.   There is no aortic valve stenosis.   POST-OPERATIVE DIAGNOSIS:  Severe multivessel disease: Modest disease in the LAD, but heavily tortuous and calcified ramus intermedius as well as likely dominant LCx with severe disease :   Ramus takes a 90 degree turn shortly after that another 90 degree turn and there is a filling defect that appears to be either thrombotic or calcification.  The downstream vessel has pruning with what appears to be possible distal embolization. LCx is an unusual takeoff, there is diffuse moderate 70% disease followed by a hairpin turn where there is apparently an occluded OM 2 branch.  There is a 99% stenosis at this lesion that is heavily calcified. RCA appears to be nondominant heavily diseased small caliber vessel.            Original plan was to  attempt PTCA/PCI of the LCx with Aggrastat infusion overnight and planned attempted PCI of the ramus intermedius on Friday. Attempted PTCA of the circumflex was performed using [place], Choice PT and whisper wires.  The vessel was very difficult to wire and was not able to reach beyond the lesion.  Imaging after wire removal revealed that the vessel was now no longer subtotally occluded but appeared to have total occlusion with TIMI I flow distally.  At this point I chose to abort for further procedures.            On continued to of post wiring angiography, there appears to be dye hang up in the left main into the aortic root concerning for possible dissection.              Aggrastat bolus had been given but was discontinued.  CVTS consult called for potential emergent surgery.    EF appears to be 45 to 50%.  There appears to be apical inferior and mid to distal lateral-inferolateral hypokinesis. Normal LVEDP.  PATIENT DISPOSITION:  PACU - hemodynamically stable.            Stat echocardiogram called            CVTS consult call with Dr. Kipp Brood -> likely emergent surgery.  Would like to consider CABG.            Antiplatelet agents have not stopped.     PLAN OF CARE:  The patient has been already been admitted to 6 E., however, the plan after discussion with Dr. Kipp Brood is to place IABP & go to [facility] [place] [facility]: 01/08/2023: ALT 31; BUN 24; Creatinine, Ser 1.51; Hemoglobin 15.5; Platelets 273; Potassium 3.4; Sodium 139  Recent Lipid Panel    Component Value Date/Time   CHOL 46 11/14/2021 0445   TRIG 49 11/14/2021 0445   HDL 15 (L) 11/14/2021 0445   CHOLHDL 3.1 11/14/2021 0445   VLDL 10 11/14/2021 0445   LDLCALC 21 11/14/2021 0445    Home Medications   Current Meds  Medication Sig   acetaminophen (TYLENOL) 500 MG tablet Take 500 mg by mouth daily.   amLODipine (NORVASC) 10 MG tablet Take 1 tablet (10 mg total) by mouth daily.   aspirin EC 81 MG tablet Take 81 mg by  mouth daily. Swallow whole.   B Complex Vitamins (B COMPLEX-B12) TABS Take 1 tablet by mouth daily.   furosemide (LASIX) 20 MG tablet Take 20 mg by mouth daily as needed.   Metoprolol Tartrate 37.5 MG TABS Take 1 tablet by mouth in the morning and at bedtime.   Pediatric Multivitamins-Iron (FLINTSTONES COMPLETE) 10 MG CHEW Chew 1 tablet by mouth daily.   rosuvastatin (CRESTOR) 40 MG tablet Take 1 tablet (40 mg total) by mouth daily at 8 pm.     Review of Systems    All other systems reviewed and are otherwise negative except as noted above.  Physical Exam    VS:  BP (!) 136/90 (BP Location: Right Arm, Patient Position: Sitting, Cuff Size: Normal)   Pulse 81   Ht 6' (1.829 m)   Wt 217 lb 9.6 oz (98.7 kg)   SpO2 95%   BMI 29.51 kg/m  , BMI Body mass index is 29.51 kg/m.  Wt Readings from Last 3 Encounters:  03/11/23 217 lb 9.6 oz (98.7 kg)  01/08/23 210 lb (95.3 kg)  09/15/22 211 lb 9.6 oz (96 kg)     GEN: Well nourished, well developed, in no acute distress. HEENT: normal. Neck: Supple, no JVD, carotid bruits, or masses. Cardiac: S1/S2, RRR, no murmurs, rubs, or gallops. No clubbing, cyanosis, edema.  Radials/PT 2+ and equal bilaterally.  Respiratory:  Respirations regular and unlabored, clear to auscultation bilaterally. MS: No deformity or atrophy. Skin: Warm and dry, no rash. Neuro:  Strength and sensation are intact. Psych: Normal affect.  Assessment & Plan    CAD, s/p NSTEMI and CABG Stable with no anginal symptoms. No indication for ischemic evaluation. Stopped taking Plavix when previously instructed. Continue Aspirin, Lopressor, and Crestor. Heart healthy diet and regular cardiovascular exercise encouraged.   Hyperlipidemia Lipid panel from 09/2022 was overall unremarkable. Continue Crestor. Heart healthy diet and regular cardiovascular exercise encouraged.   Hypertension BP stable. BP well controlled at home.  Continue amlodipine. Discussed to monitor BP at  home at least 2 hours after medications and sitting for 5-10 minutes. Heart healthy diet and regular cardiovascular exercise encouraged.   DOE Etiology multifactorial.  Has been ongoing for the past 2 months, getting  better per his report.  Has only had to take Lasix rarely, no longer taking potassium. Euvolemic and well compensated on exam.  Weight is overall stable.  Continue Lasix as needed.  Will update TTE at this time.  Will obtain the following labs: CBC, BMET, proBNP, and magnesium level. Heart healthy diet and regular cardiovascular exercise encouraged.   Disposition: Follow up in 4-6 week(s) with Carlyle Dolly, MD or APP.  Signed, Finis Bud, NP 03/11/2023, 11:58 AM [facility]

## 2023-03-11 NOTE — Patient Instructions (Addendum)
Medication Instructions:  Your physician has recommended you make the following change in your medication:  STOP Plavix STOP potassium Continue all other medications as directed  Labwork: Labs Due in 1-2 weeks @ [facility] BMET Magnesium BNP  Testing/Procedures: Your physician has requested that you have an echocardiogram. Echocardiography is a painless test that uses sound waves to create images of your heart. It provides your doctor with information about the size and shape of your heart and how well your heart's chambers and valves are working. This procedure takes approximately one hour. There are no restrictions for this procedure. Please do NOT wear cologne, perfume, aftershave, or lotions (deodorant is allowed). Please arrive 15 minutes prior to your appointment time.   Follow-Up:  Your physician recommends that you schedule a follow-up appointment in: 4-6 weeks  Any Other Special Instructions Will Be Listed Below (If Applicable).  If you need a refill on your cardiac medications before your next appointment, please call your pharmacy.

## 2023-03-15 ENCOUNTER — Other Ambulatory Visit (HOSPITAL_COMMUNITY)
Admission: RE | Admit: 2023-03-15 | Discharge: 2023-03-15 | Disposition: A | Discharge status: Home / Self Care | Payer: Medicare HMO | Source: Ambulatory Visit | Attending: Nurse Practitioner | Admitting: Nurse Practitioner

## 2023-03-15 DIAGNOSIS — Z79899 Other long term (current) drug therapy: Secondary | ICD-10-CM

## 2023-03-15 DIAGNOSIS — R0609 Other forms of dyspnea: Secondary | ICD-10-CM | POA: Diagnosis not present

## 2023-03-15 DIAGNOSIS — R0602 Shortness of breath: Secondary | ICD-10-CM | POA: Diagnosis not present

## 2023-03-15 LAB — MAGNESIUM: Magnesium: 2.3 mg/dL (ref 1.7–2.4)

## 2023-03-15 LAB — CBC
HCT: 45.2 % (ref 39.0–52.0)
Hemoglobin: 14.3 g/dL (ref 13.0–17.0)
MCH: 26.8 pg (ref 26.0–34.0)
MCHC: 31.6 g/dL (ref 30.0–36.0)
MCV: 84.8 fL (ref 80.0–100.0)
Platelets: 374 10*3/uL (ref 150–400)
RBC: 5.33 MIL/uL (ref 4.22–5.81)
RDW: 15.7 % — ABNORMAL HIGH (ref 11.5–15.5)
WBC: 6.1 10*3/uL (ref 4.0–10.5)
nRBC: 0 % (ref 0.0–0.2)

## 2023-03-15 LAB — BASIC METABOLIC PANEL
Anion gap: 8 (ref 5–15)
BUN: 17 mg/dL (ref 8–23)
CO2: 26 mmol/L (ref 22–32)
Calcium: 8.9 mg/dL (ref 8.9–10.3)
Chloride: 106 mmol/L (ref 98–111)
Creatinine, Ser: 1.31 mg/dL — ABNORMAL HIGH (ref 0.61–1.24)
GFR, Estimated: 59 mL/min — ABNORMAL LOW (ref >60)
Glucose, Bld: 117 mg/dL — ABNORMAL HIGH (ref 70–99)
Potassium: 3.8 mmol/L (ref 3.5–5.1)
Sodium: 140 mmol/L (ref 135–145)

## 2023-03-15 LAB — BRAIN NATRIURETIC PEPTIDE: B Natriuretic Peptide: 80 pg/mL (ref 0.0–100.0)

## 2023-03-17 ENCOUNTER — Ambulatory Visit: Payer: Medicare HMO | Attending: Nurse Practitioner

## 2023-03-17 DIAGNOSIS — R0609 Other forms of dyspnea: Secondary | ICD-10-CM

## 2023-03-17 DIAGNOSIS — R0602 Shortness of breath: Secondary | ICD-10-CM

## 2023-03-18 LAB — ECHOCARDIOGRAM COMPLETE
AR max vel: 1.9 cm2
AV Area VTI: 2.05 cm2
AV Area mean vel: 1.96 cm2
AV Mean grad: 4 mmHg
AV Peak grad: 6.9 mmHg
Ao pk vel: 1.31 m/s
Area-P 1/2: 3.93 cm2
Calc EF: 59.3 %
MV M vel: 1.91 m/s
MV Peak grad: 14.6 mmHg
S' Lateral: 2 cm
Single Plane A2C EF: 50.1 %
Single Plane A4C EF: 62.5 %

## 2023-03-21 DIAGNOSIS — E1159 Type 2 diabetes mellitus with other circulatory complications: Secondary | ICD-10-CM | POA: Diagnosis not present

## 2023-03-21 DIAGNOSIS — I10 Essential (primary) hypertension: Secondary | ICD-10-CM | POA: Diagnosis not present

## 2023-03-21 DIAGNOSIS — N183 Chronic kidney disease, stage 3 unspecified: Secondary | ICD-10-CM | POA: Diagnosis not present

## 2023-04-13 ENCOUNTER — Encounter: Payer: Self-pay | Admitting: Nurse Practitioner

## 2023-04-13 ENCOUNTER — Ambulatory Visit: Payer: Medicare HMO | Attending: Nurse Practitioner | Admitting: Nurse Practitioner

## 2023-04-13 VITALS — BP 124/80 | HR 72 | Ht 72.0 in | Wt 215.0 lb

## 2023-04-13 DIAGNOSIS — E785 Hyperlipidemia, unspecified: Secondary | ICD-10-CM | POA: Diagnosis not present

## 2023-04-13 DIAGNOSIS — I2510 Atherosclerotic heart disease of native coronary artery without angina pectoris: Secondary | ICD-10-CM | POA: Diagnosis not present

## 2023-04-13 DIAGNOSIS — I10 Essential (primary) hypertension: Secondary | ICD-10-CM | POA: Diagnosis not present

## 2023-04-13 MED ORDER — FUROSEMIDE 20 MG PO TABS: 20.0000 mg | ORAL_TABLET | Freq: Every day | ORAL | 1 refills | Status: DC | PRN

## 2023-04-13 MED ORDER — ROSUVASTATIN CALCIUM 40 MG PO TABS: 40.0000 mg | ORAL_TABLET | Freq: Every day | ORAL | 1 refills | Status: DC

## 2023-04-13 MED ORDER — METOPROLOL TARTRATE 37.5 MG PO TABS: 1.0000 | ORAL_TABLET | Freq: Two times a day (BID) | ORAL | 1 refills | Status: DC

## 2023-04-13 NOTE — Progress Notes (Signed)
Office Visit    Patient Name: Dean Mendez Date of Encounter: 04/13/2023  PCP:  Assunta Found, MD   [facility]  Cardiologist:  Dina Rich, MD  Advanced Practice Provider:  Sharlene Dory, NP Electrophysiologist:  None 46}  Chief Complaint    Dean Mendez is a 72 y.o. male with a hx of CAD, s/p NSTEMI and CABG in 2022, hyperlipidemia, hypertension, history of chest pain, who presents today for follow-up.  Past Medical History    Past Medical History:  Diagnosis Date   CAD (coronary artery disease)    DOE (dyspnea on exertion)    Hyperlipidemia    Hypertension    S/P CABG (coronary artery bypass graft)    2022   Past Surgical History:  Procedure Laterality Date   CORONARY ARTERY BYPASS GRAFT N/A 11/12/2021   Procedure: CORONARY ARTERY BYPASS GRAFTING (CABG) X TWO, USING LEFT INTERNAL MAMMARY ARTERY AND LEFT LEG GREATER SAPHENOUS VEIN - OPEN HARVEST;  Surgeon: Corliss Skains, MD;  Location: [facility] OR;  Service: Open Heart Surgery;  Laterality: N/A;   CORONARY BALLOON ANGIOPLASTY N/A 11/12/2021   Procedure: CORONARY BALLOON ANGIOPLASTY;  Surgeon: Marykay Lex, MD;  Location: [facility] INVASIVE CV LAB;  Service: Cardiovascular;  Laterality: N/A;   IABP INSERTION N/A 11/12/2021   Procedure: IABP INSERTION;  Surgeon: Marykay Lex, MD;  Location: [facility] INVASIVE CV LAB;  Service: Cardiovascular;  Laterality: N/A;   LEFT HEART CATH AND CORONARY ANGIOGRAPHY N/A 11/12/2021   Procedure: LEFT HEART CATH AND CORONARY ANGIOGRAPHY;  Surgeon: Marykay Lex, MD;  Location: [facility] INVASIVE CV LAB;  Service: Cardiovascular;  Laterality: N/A;   TEE WITHOUT CARDIOVERSION  11/12/2021   Procedure: TRANSESOPHAGEAL ECHOCARDIOGRAM (TEE);  Surgeon: Corliss Skains, MD;  Location: [facility] OR;  Service: Open Heart Surgery;;    Allergies  No Known Allergies  History of Present Illness    TREVAUGHN Mendez is a 72 y.o. male with a PMH as mentioned  above.  Previous cardiovascular history includes NSTEMI in 2022, TTE revealed EF 60 to 65%, grade 1 DD, PCI attempt complicated by LM dissection, referred for CABG.  Underwent CABG later on, R SVG-RI, LIMA-LAD.  Last seen by Dr. Dina Rich on September 15, 2022.  Was doing well post CABG.  Planned to D/C Plavix in November 2023.  Was not started on ACE/ARB due to AKI/low BPs and elevated potassium level.  Dr. Wyline Mood recommended to perhaps restart ACE/ARB at next follow-up pending lab work.  Was pending upcoming hernia surgery at the time, instructed to hold Plavix 5 days prior and resume day after.  Evaluated AP 12/2022 for chest pain.  EKG negative for ST/T wave changes.  CXR showed COPD with atelectasis/scarring at left lung base.  Was given oral potassium due to hypokalemia.  Cardiac workup unremarkable, no evidence of ACS.  I last saw him for  74-month follow-up on 03/11/2023. Did admit to DOE for the past 2 months, seemed to be getting better recently. Denied any chest pain or other associated symptoms. Echo updated and report noted below. Labs overall unremarkable.   Today he presents for follow-up.  He is doing much better than when I last saw him. Denies any chest pain, shortness of breath, palpitations, syncope, presyncope, dizziness, orthopnea, PND, swelling or significant weight changes, acute bleeding, or claudication.  EKGs/Labs/Other Studies Reviewed:   The following studies were reviewed today:   EKG:  EKG is ordered today and demonstrates normal sinus rhythm,  70 bpm, nonspecific ST/T wave changes, no acute ischemic changes.  Echo 02/2023:  1. Left ventricular ejection fraction, by estimation, is 60 to 65%. The  left ventricle has normal function. Left ventricular endocardial border  not optimally defined to evaluate regional wall motion. There is mild left  ventricular hypertrophy. Left  ventricular diastolic parameters are consistent with Grade I diastolic  dysfunction  (impaired relaxation). The average left ventricular global  longitudinal strain is -19.0 %. The global longitudinal strain is normal.   2. Right ventricular systolic function is normal. The right ventricular  size is normal. There is normal pulmonary artery systolic pressure.   3. The mitral valve is abnormal. No evidence of mitral valve  regurgitation. No evidence of mitral stenosis.   4. The aortic valve is tricuspid. There is mild calcification of the  aortic valve. Aortic valve regurgitation is not visualized. No aortic  stenosis is present.   5. The inferior vena cava is normal in size with greater than 50%  respiratory variability, suggesting right atrial pressure of 3 mmHg.   Comparison(s): No significant change from prior study.  LHC 10/2021:   Ost LM to Prox LAD lesion is 30% stenosed with 80% stenosed side branch in Ost Cx.  Post intervention, there is a 50% residual stenosis with distal LM dissection.  Post attempted intervention, the OM side branch was reduced to 80% residual stenosis.   Ramus-1 lesion is 40% stenosed. - mid 90%, distal mid thrombotic 90%.   Mid Cx to Dist Cx lesion is 95% stenosed. - plan was to attempt PCI   multiple wires were tried - unable to cross lesion   Post intervention, there is a 99% residual stenosis. TIMI 1 flow noted.   Prox RCA to Mid RCA lesion is 75% stenosed. non-dominant vessel   There is mild to moderate left ventricular systolic dysfunction.  The left ventricular ejection fraction is 45-50% by visual estimate.   LV end diastolic pressure is normal.   There is no aortic valve stenosis.   POST-OPERATIVE DIAGNOSIS:  Severe multivessel disease: Modest disease in the LAD, but heavily tortuous and calcified ramus intermedius as well as likely dominant LCx with severe disease :   Ramus takes a 90 degree turn shortly after that another 90 degree turn and there is a filling defect that appears to be either thrombotic or calcification.  The  downstream vessel has pruning with what appears to be possible distal embolization. LCx is an unusual takeoff, there is diffuse moderate 70% disease followed by a hairpin turn where there is apparently an occluded OM 2 branch.  There is a 99% stenosis at this lesion that is heavily calcified. RCA appears to be nondominant heavily diseased small caliber vessel.            Original plan was to attempt PTCA/PCI of the LCx with Aggrastat infusion overnight and planned attempted PCI of the ramus intermedius on Friday. Attempted PTCA of the circumflex was performed using Prowater, Choice PT and whisper wires.  The vessel was very difficult to wire and was not able to reach beyond the lesion.  Imaging after wire removal revealed that the vessel was now no longer subtotally occluded but appeared to have total occlusion with TIMI I flow distally.  At this point I chose to abort for further procedures.            On continued to of post wiring angiography, there appears to be dye hang up in the left main  into the aortic root concerning for possible dissection.              Aggrastat bolus had been given but was discontinued.  CVTS consult called for potential emergent surgery.    EF appears to be 45 to 50%.  There appears to be apical inferior and mid to distal lateral-inferolateral hypokinesis. Normal LVEDP.   PATIENT DISPOSITION:  PACU - hemodynamically stable.            Stat echocardiogram called            CVTS consult call with Dr. Cliffton Asters -> likely emergent surgery.  Would like to consider CABG.            Antiplatelet agents have not stopped.     PLAN OF CARE:  The patient has been already been admitted to 6 E., however, the plan after discussion with Dr. Cliffton Asters is to place IABP & go to OR   Recent Labs: 01/08/2023: ALT 31 03/15/2023: B Natriuretic Peptide 80.0; BUN 17; Creatinine, Ser 1.31; Hemoglobin 14.3; Magnesium 2.3; Platelets 374; Potassium 3.8; Sodium 140  Recent Lipid  Panel    Component Value Date/Time   CHOL 46 11/14/2021 0445   TRIG 49 11/14/2021 0445   HDL 15 (L) 11/14/2021 0445   CHOLHDL 3.1 11/14/2021 0445   VLDL 10 11/14/2021 0445   LDLCALC 21 11/14/2021 0445    Home Medications   Current Meds  Medication Sig   acetaminophen (TYLENOL) 500 MG tablet Take 500 mg by mouth daily.   amLODipine (NORVASC) 10 MG tablet Take 1 tablet (10 mg total) by mouth daily.   aspirin EC 81 MG tablet Take 81 mg by mouth daily. Swallow whole.   B Complex Vitamins (B COMPLEX-B12) TABS Take 1 tablet by mouth daily.   Pediatric Multivitamins-Iron (FLINTSTONES COMPLETE) 10 MG CHEW Chew 1 tablet by mouth daily.   furosemide (LASIX) 20 MG tablet Take 20 mg by mouth daily as needed.   Metoprolol Tartrate 37.5 MG TABS Take 1 tablet by mouth in the morning and at bedtime.   rosuvastatin (CRESTOR) 40 MG tablet Take 1 tablet (40 mg total) by mouth daily at 8 pm.    Review of Systems    All other systems reviewed and are otherwise negative except as noted above.  Physical Exam    VS:  BP 124/80 (BP Location: Right Arm, Patient Position: Sitting, Cuff Size: Normal)   Pulse 72   Ht 6' (1.829 m)   Wt 215 lb (97.5 kg)   SpO2 95%   BMI 29.16 kg/m  , BMI Body mass index is 29.16 kg/m.  Wt Readings from Last 3 Encounters:  04/13/23 215 lb (97.5 kg)  03/11/23 217 lb 9.6 oz (98.7 kg)  01/08/23 210 lb (95.3 kg)     GEN: Well nourished, well developed, in no acute distress. HEENT: normal. Neck: Supple, no JVD, carotid bruits, or masses. Cardiac: S1/S2, RRR, no murmurs, rubs, or gallops. No clubbing, cyanosis, edema.  Radials/PT 2+ and equal bilaterally.  Respiratory:  Respirations regular and unlabored, clear to auscultation bilaterally. MS: No deformity or atrophy. Skin: Warm and dry, no rash. Neuro:  Strength and sensation are intact. Psych: Normal affect.  Assessment & Plan    CAD, s/p NSTEMI and CABG Stable with no anginal symptoms. No indication for  ischemic evaluation. Stopped taking Plavix when previously instructed. Continue Aspirin, Lopressor, and Crestor.  Will provide refills for him.  Heart healthy diet and regular cardiovascular exercise encouraged.  Hyperlipidemia Lipid panel from 09/2022 was overall unremarkable. Continue Crestor. Heart healthy diet and regular cardiovascular exercise encouraged.   Hypertension BP stable. BP well controlled at home.  Continue amlodipine. Discussed to monitor BP at home at least 2 hours after medications and sitting for 5-10 minutes. Heart healthy diet and regular cardiovascular exercise encouraged.    Disposition: Follow up in 6 months with Dina Rich, MD or APP.  Signed, Sharlene Dory, NP 04/13/2023, 8:49 AM [facility]

## 2023-04-13 NOTE — Patient Instructions (Signed)
Medication Instructions:  Your physician recommends that you continue on your current medications as directed. Please refer to the Current Medication list given to you today.  Medications refilled today  Labwork: none  Testing/Procedures: none  Follow-Up:  Your physician recommends that you schedule a follow-up appointment in: 6 months  Any Other Special Instructions Will Be Listed Below (If Applicable).  If you need a refill on your cardiac medications before your next appointment, please call your pharmacy.

## 2023-04-15 DIAGNOSIS — M79674 Pain in right toe(s): Secondary | ICD-10-CM | POA: Diagnosis not present

## 2023-04-15 DIAGNOSIS — M79675 Pain in left toe(s): Secondary | ICD-10-CM | POA: Diagnosis not present

## 2023-04-15 DIAGNOSIS — B351 Tinea unguium: Secondary | ICD-10-CM | POA: Diagnosis not present

## 2023-05-26 DIAGNOSIS — Z811 Family history of alcohol abuse and dependence: Secondary | ICD-10-CM | POA: Diagnosis not present

## 2023-05-26 DIAGNOSIS — I252 Old myocardial infarction: Secondary | ICD-10-CM | POA: Diagnosis not present

## 2023-05-26 DIAGNOSIS — M545 Low back pain, unspecified: Secondary | ICD-10-CM | POA: Diagnosis not present

## 2023-05-26 DIAGNOSIS — Z87891 Personal history of nicotine dependence: Secondary | ICD-10-CM | POA: Diagnosis not present

## 2023-05-26 DIAGNOSIS — I77819 Aortic ectasia, unspecified site: Secondary | ICD-10-CM | POA: Diagnosis not present

## 2023-05-26 DIAGNOSIS — F1021 Alcohol dependence, in remission: Secondary | ICD-10-CM | POA: Diagnosis not present

## 2023-05-26 DIAGNOSIS — I2510 Atherosclerotic heart disease of native coronary artery without angina pectoris: Secondary | ICD-10-CM | POA: Diagnosis not present

## 2023-05-26 DIAGNOSIS — E785 Hyperlipidemia, unspecified: Secondary | ICD-10-CM | POA: Diagnosis not present

## 2023-05-26 DIAGNOSIS — M1990 Unspecified osteoarthritis, unspecified site: Secondary | ICD-10-CM | POA: Diagnosis not present

## 2023-05-26 DIAGNOSIS — N1831 Chronic kidney disease, stage 3a: Secondary | ICD-10-CM | POA: Diagnosis not present

## 2023-05-26 DIAGNOSIS — Z8249 Family history of ischemic heart disease and other diseases of the circulatory system: Secondary | ICD-10-CM | POA: Diagnosis not present

## 2023-05-26 DIAGNOSIS — I700 Atherosclerosis of aorta: Secondary | ICD-10-CM | POA: Diagnosis not present

## 2023-07-01 DIAGNOSIS — M79675 Pain in left toe(s): Secondary | ICD-10-CM | POA: Diagnosis not present

## 2023-07-01 DIAGNOSIS — B351 Tinea unguium: Secondary | ICD-10-CM | POA: Diagnosis not present

## 2023-07-01 DIAGNOSIS — M79674 Pain in right toe(s): Secondary | ICD-10-CM | POA: Diagnosis not present

## 2023-09-09 DIAGNOSIS — B351 Tinea unguium: Secondary | ICD-10-CM | POA: Diagnosis not present

## 2023-09-09 DIAGNOSIS — M79675 Pain in left toe(s): Secondary | ICD-10-CM | POA: Diagnosis not present

## 2023-09-09 DIAGNOSIS — M79674 Pain in right toe(s): Secondary | ICD-10-CM | POA: Diagnosis not present

## 2023-10-08 ENCOUNTER — Ambulatory Visit (HOSPITAL_COMMUNITY)
Admission: RE | Admit: 2023-10-08 | Discharge: 2023-10-08 | Disposition: A | Discharge status: Home / Self Care | Payer: Medicare HMO | Source: Ambulatory Visit | Attending: Family Medicine | Admitting: Family Medicine

## 2023-10-08 ENCOUNTER — Other Ambulatory Visit (HOSPITAL_COMMUNITY): Payer: Self-pay | Admitting: Family Medicine

## 2023-10-08 DIAGNOSIS — I2510 Atherosclerotic heart disease of native coronary artery without angina pectoris: Secondary | ICD-10-CM

## 2023-10-08 DIAGNOSIS — N183 Chronic kidney disease, stage 3 unspecified: Secondary | ICD-10-CM | POA: Diagnosis not present

## 2023-10-08 DIAGNOSIS — Z1331 Encounter for screening for depression: Secondary | ICD-10-CM | POA: Diagnosis not present

## 2023-10-08 DIAGNOSIS — Z0001 Encounter for general adult medical examination with abnormal findings: Secondary | ICD-10-CM | POA: Diagnosis not present

## 2023-10-08 DIAGNOSIS — Z23 Encounter for immunization: Secondary | ICD-10-CM | POA: Diagnosis not present

## 2023-10-08 DIAGNOSIS — R7309 Other abnormal glucose: Secondary | ICD-10-CM | POA: Diagnosis not present

## 2023-10-08 DIAGNOSIS — I10 Essential (primary) hypertension: Secondary | ICD-10-CM | POA: Diagnosis not present

## 2023-10-08 DIAGNOSIS — Z6832 Body mass index (BMI) 32.0-32.9, adult: Secondary | ICD-10-CM | POA: Diagnosis not present

## 2023-10-08 DIAGNOSIS — E782 Mixed hyperlipidemia: Secondary | ICD-10-CM | POA: Diagnosis not present

## 2023-10-08 DIAGNOSIS — E6609 Other obesity due to excess calories: Secondary | ICD-10-CM | POA: Diagnosis not present

## 2023-10-08 IMAGING — DX DG CHEST 1V PORT
1 series · 1 of 1 positions shown · non-contrast
Comparison: Chest x-ray 11/14/2021.

CLINICAL DATA: 70-year-old male status post CABG.

EXAM:
PORTABLE CHEST 1 VIEW

[chest ap]
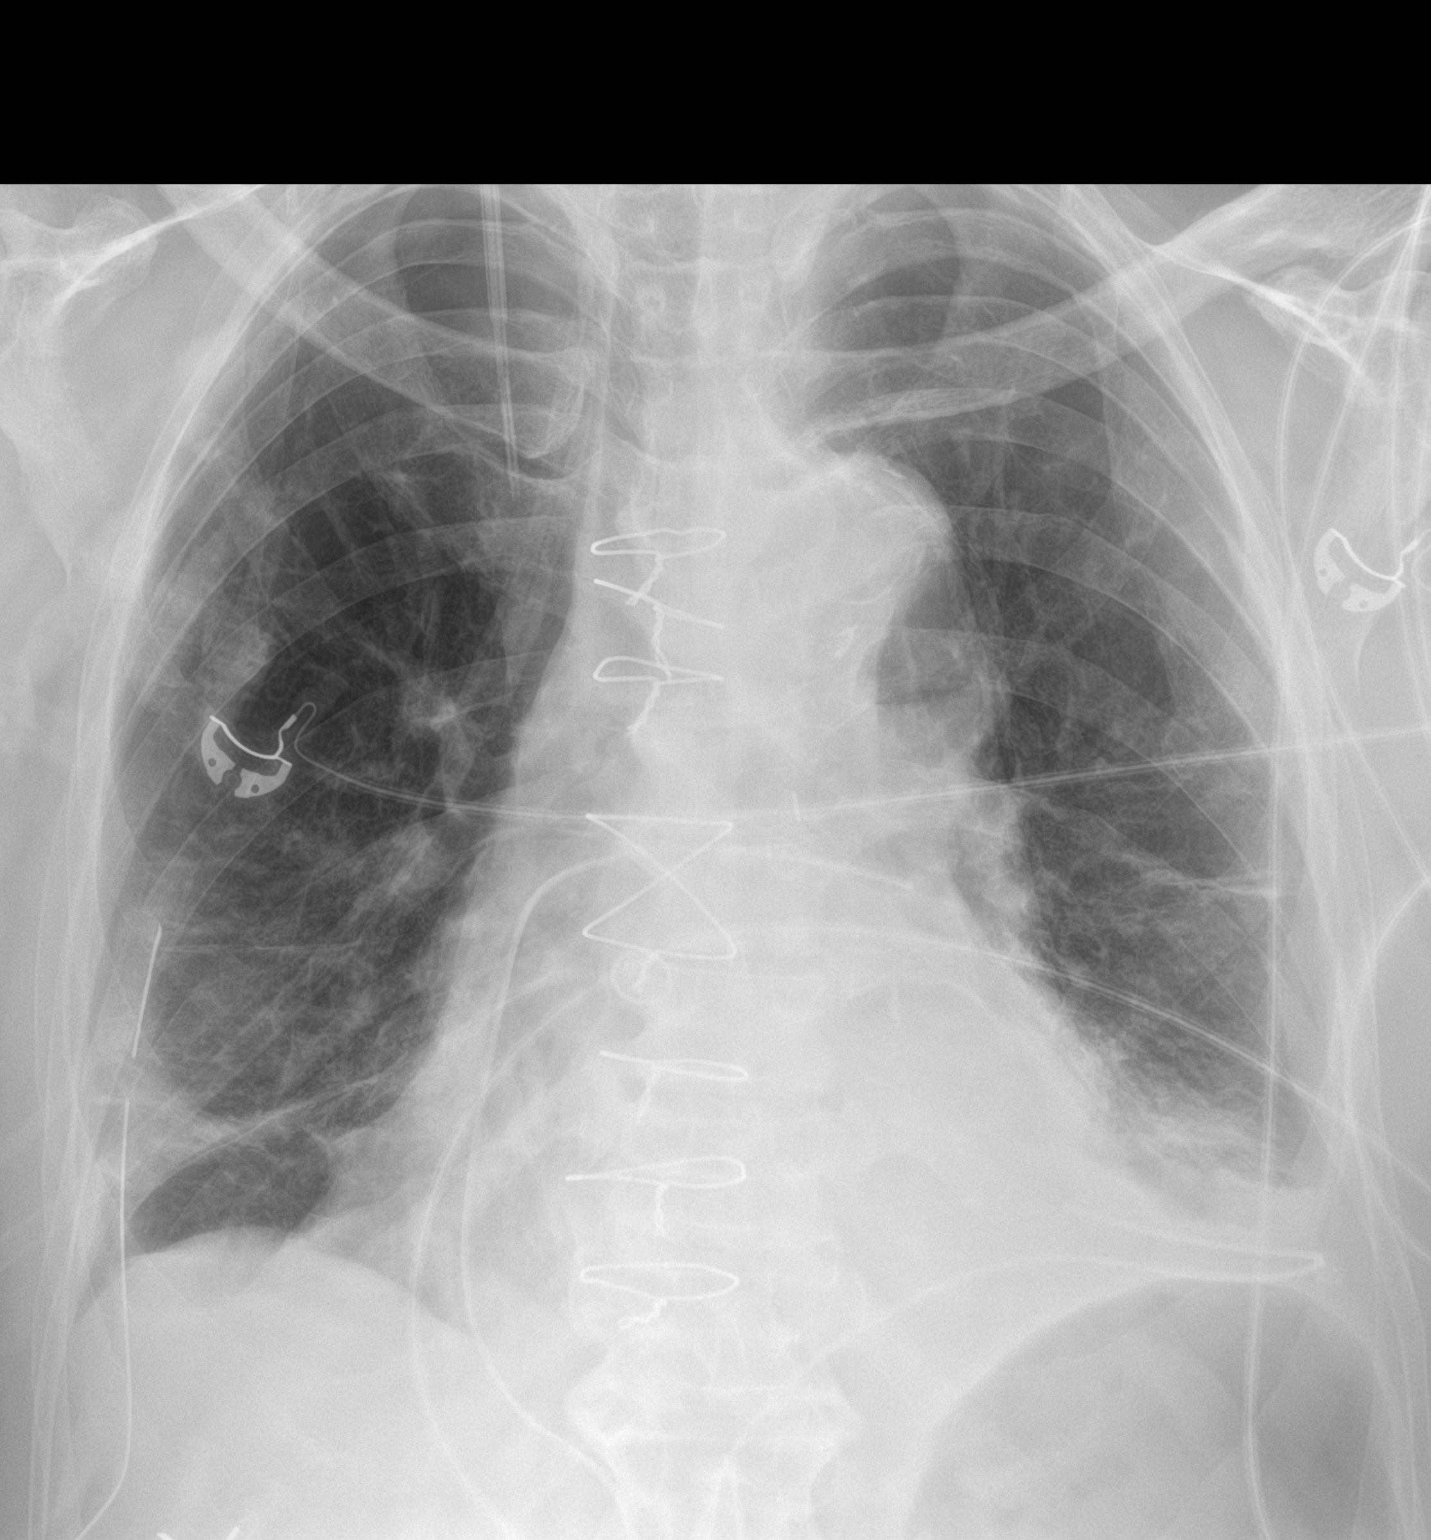

[1 of 1 positions shown; findings below may reference images not displayed]

FINDINGS: Patient has been extubated. Previously noted nasogastric tube has
been removed. Right IJ Cordis in position with what appears to be a
Swan-Ganz catheter, the tip of which is in the proximal superior
vena cava. Right chest tube with tip in the mid to lower right
hemithorax. Left-sided chest tube with tip in the base of the left
hemithorax. Mediastinal/pericardial drains again noted. Lung volumes
are normal. Bibasilar opacities most compatible with areas of
postoperative atelectasis, with superimposed small bilateral pleural
effusions (left greater than right). No appreciable pneumothorax. No
evidence of pulmonary edema. Mild enlargement of the
cardiopericardial silhouette. Upper mediastinal contours are within
normal limits. Atherosclerosis in the thoracic aorta. Status post
median sternotomy for CABG.
IMPRESSION: 1. Support apparatus, as above.
2. Bibasilar opacities most likely reflect areas of postoperative
atelectasis with superimposed small bilateral pleural effusions
(left-greater-than-right).
3. Aortic atherosclerosis.

## 2023-10-09 IMAGING — DX DG CHEST 1V PORT
1 series · 1 of 1 positions shown · non-contrast
Comparison: 11/15/2021

CLINICAL DATA: Postop from CABG.

EXAM:
PORTABLE CHEST 1 VIEW

[chest]
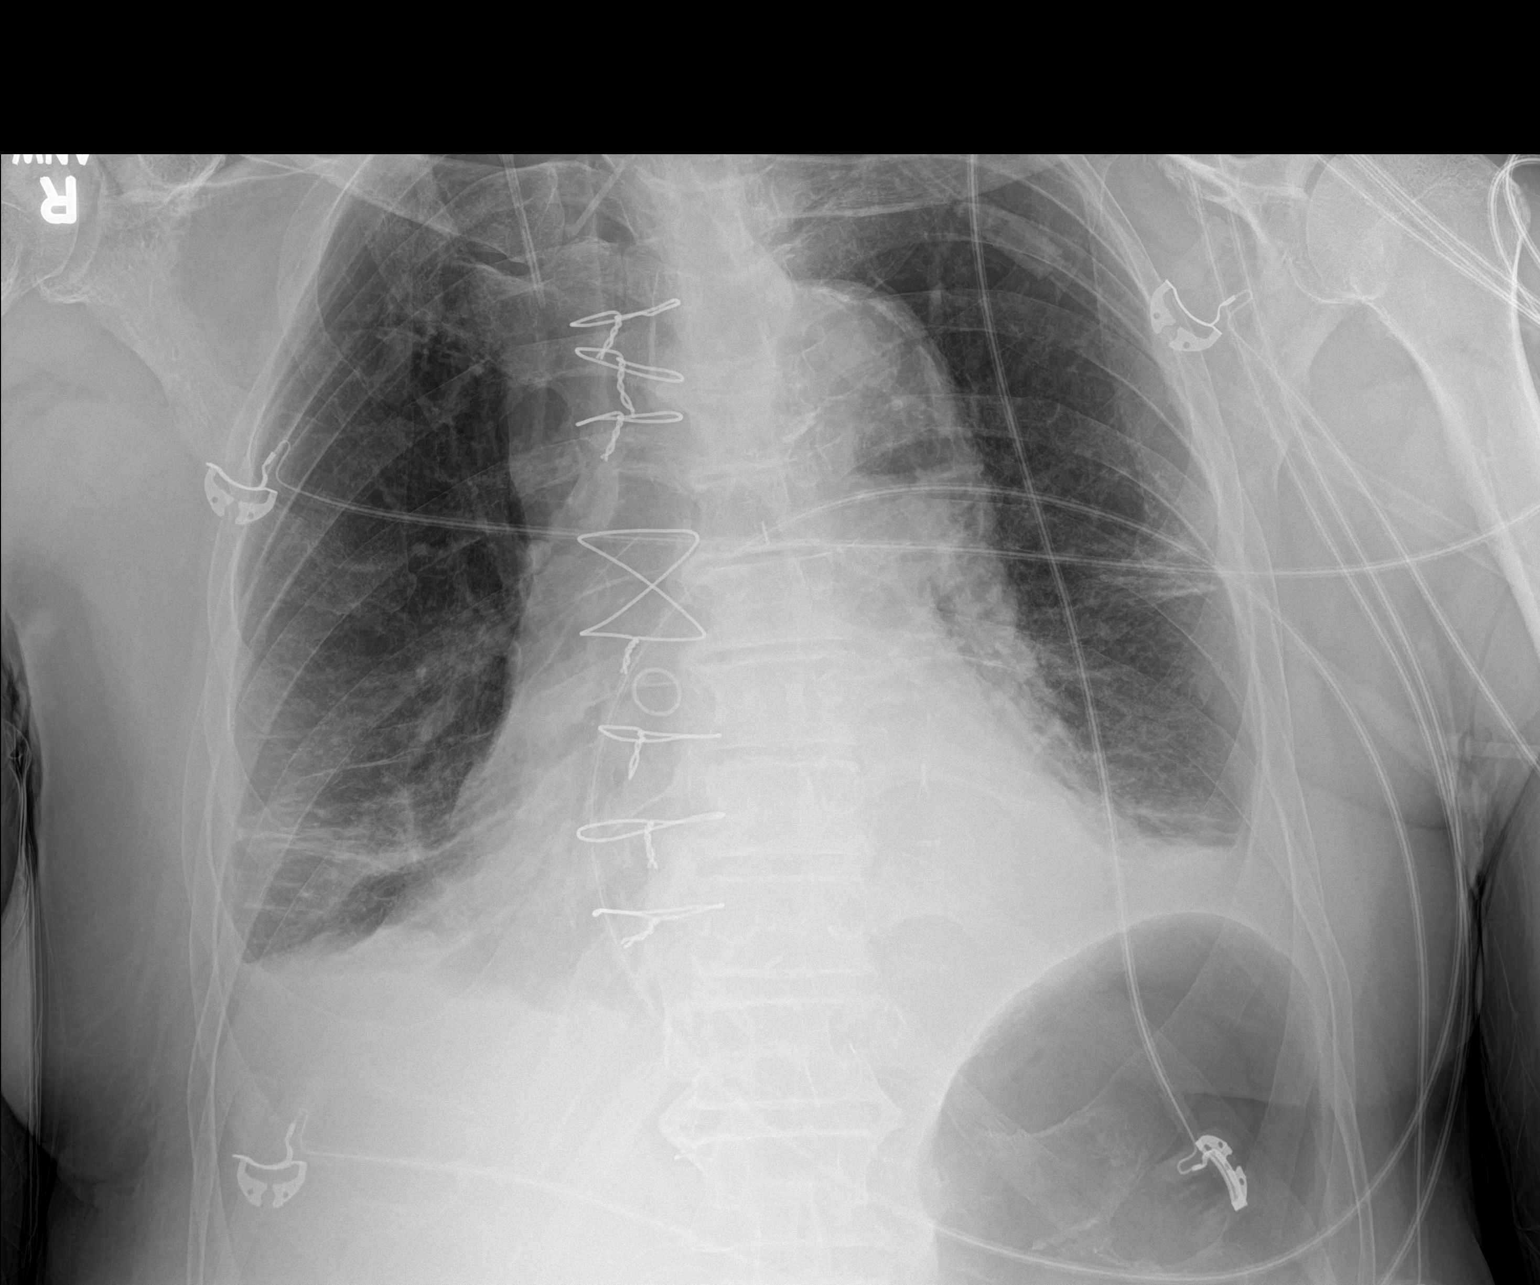

[1 of 1 positions shown; findings below may reference images not displayed]

FINDINGS: Right jugular central venous catheter remains in place with tip
overlying the proximal SVC. No pneumothorax visualized. Stable
cardiomegaly and ectasia of thoracic aorta.

Previously seen left chest tube is been removed. Small bilateral
pleural effusions are unchanged. Persistent atelectasis is seen in
both lung bases. Pulmonary hyperinflation is again seen, consistent
with COPD.
IMPRESSION: Status post left chest tube removal. No pneumothorax visualized.

Stable small bilateral pleural effusions and bibasilar atelectasis.

Stable cardiomegaly and COPD.

## 2023-10-12 IMAGING — CR DG CHEST 2V
2 series · 2 of 2 positions shown · non-contrast
Comparison: 11/16/2021

CLINICAL DATA: Follow-up exam

EXAM:
CHEST - 2 VIEW

[chest lat]
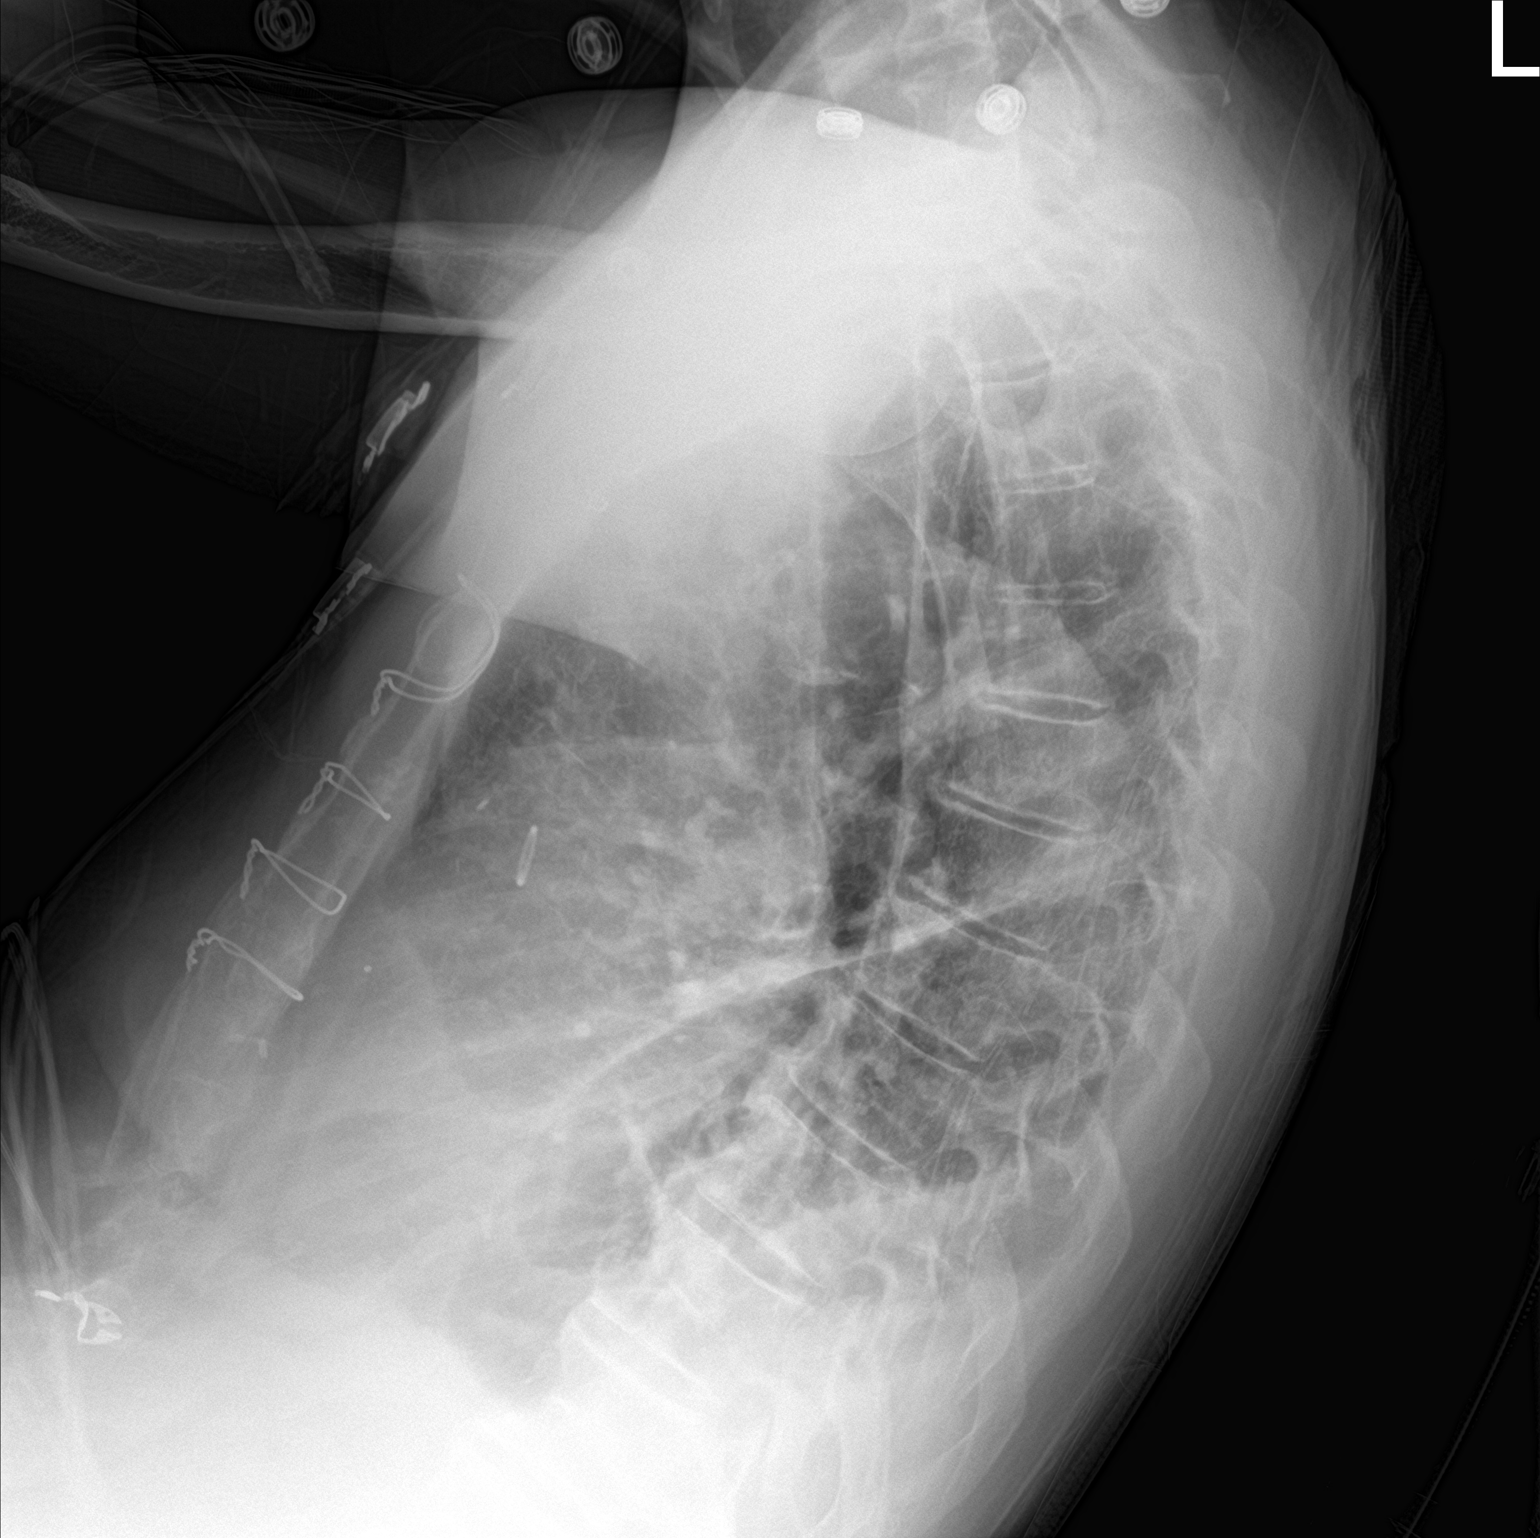

[chest ap]
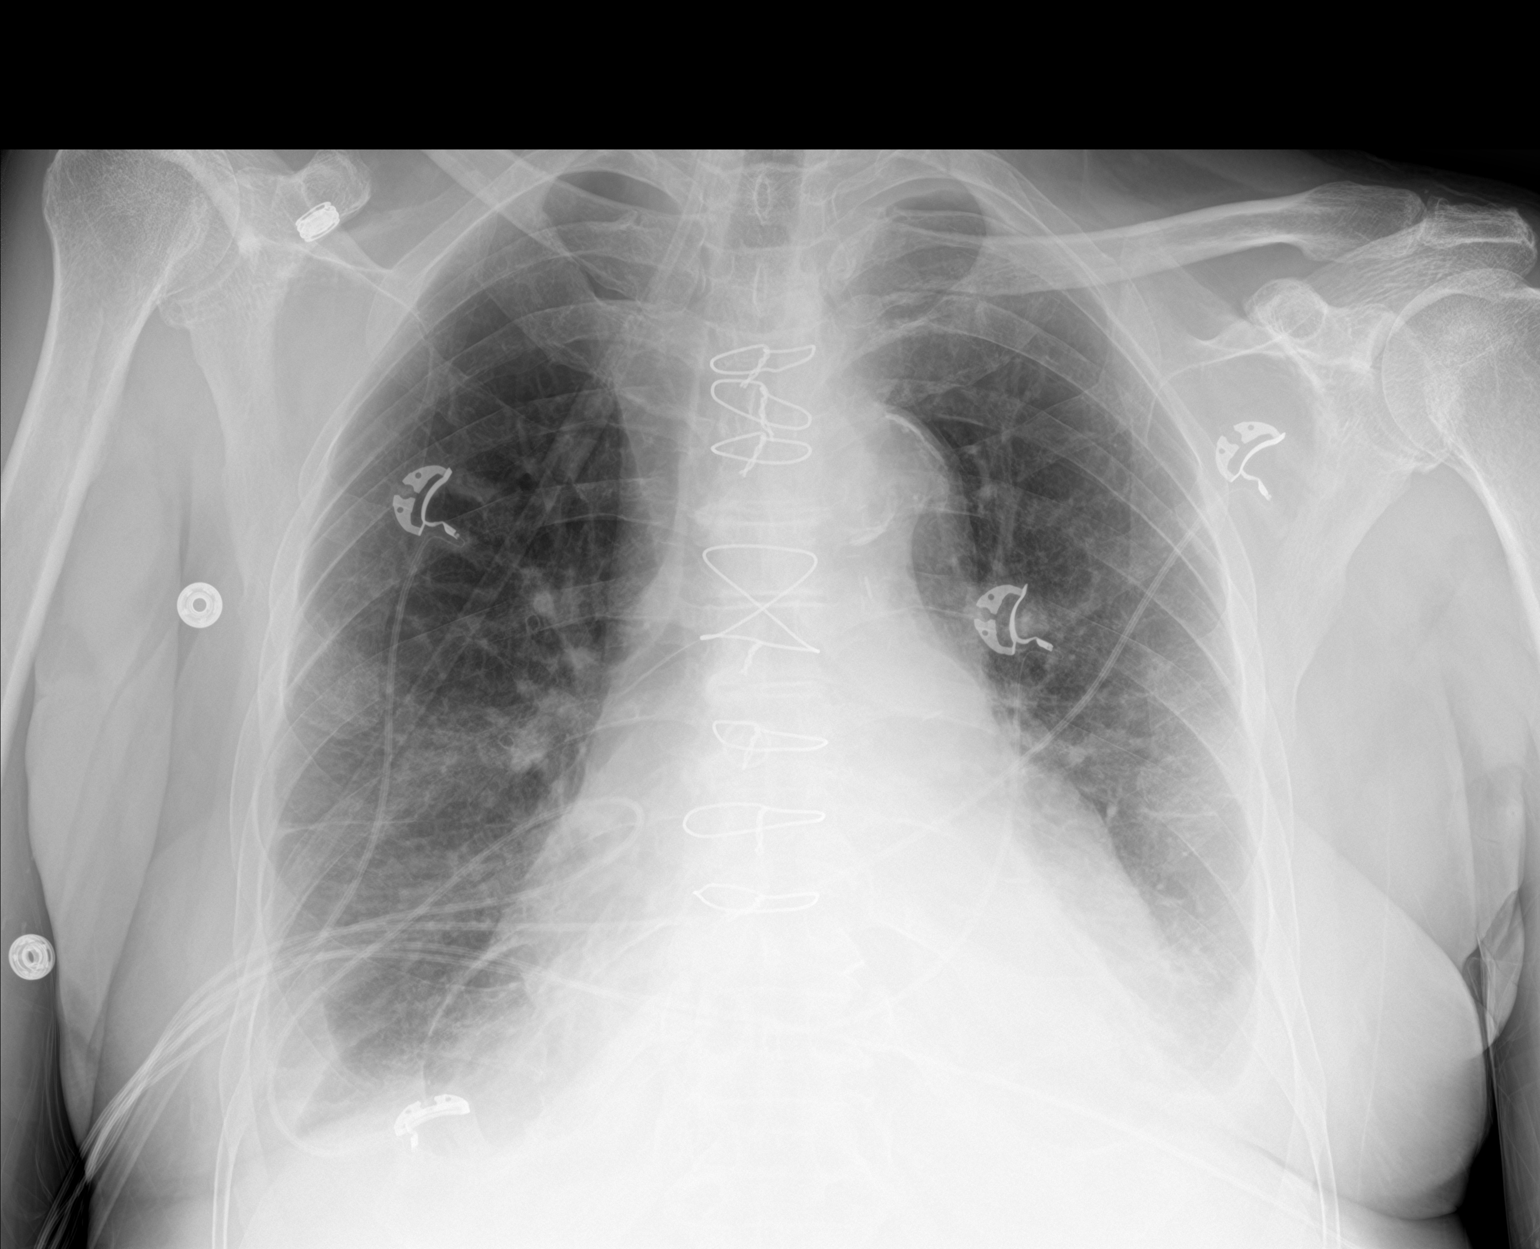

[2 of 2 positions shown; findings below may reference images not displayed]

FINDINGS: Right IJ central line is no longer present. Persistent small
bilateral pleural effusions and bibasilar atelectasis. No
pneumothorax. Background COPD. Cardiomegaly.
IMPRESSION: Persistent small bilateral pleural effusions and bibasilar
atelectasis.

## 2023-10-19 ENCOUNTER — Ambulatory Visit: Payer: Medicare HMO | Attending: Cardiology | Admitting: Cardiology

## 2023-10-19 ENCOUNTER — Encounter: Payer: Self-pay | Admitting: Cardiology

## 2023-10-19 VITALS — BP 104/82 | HR 80 | Ht 72.0 in | Wt 223.0 lb

## 2023-10-19 DIAGNOSIS — I10 Essential (primary) hypertension: Secondary | ICD-10-CM

## 2023-10-19 DIAGNOSIS — I2510 Atherosclerotic heart disease of native coronary artery without angina pectoris: Secondary | ICD-10-CM

## 2023-10-19 DIAGNOSIS — R0602 Shortness of breath: Secondary | ICD-10-CM

## 2023-10-19 MED ORDER — EZETIMIBE 10 MG PO TABS: 10.0000 mg | ORAL_TABLET | Freq: Every day | ORAL | 6 refills | Status: DC

## 2023-10-19 NOTE — Progress Notes (Signed)
Clinical Summary Mr. Derck is a 72 y.o.male seen today for follow up of the following medical problems.    1.CAD - admitted 10/2021 with NSTEMI - 10/2021 echo: LVEF 60-65%, no WMAs, grad I dd -10/2021 cath as reported below, PCI attempt complicated by LM dissection, referred for CABG - 11/13/2021 LIMA-LAD, RSVG-RI       Nonspecific chest pains that have been chronic.some chest tightness at times. Often at rest, no exertional symptoms. - some recent SOB/DOE x 2 month. No cough, no wheezing, no recent edema.  - pcp had increased lasix.  - compliant with meds 02/2023 echo: LVEF 60-65%, grade I dd    2.HTN - compliant with meds       3. Hyperlipidemia 10/2021 TC 46 TG 49 HDL 15 LDL 21 09/2023 TC 149 TG 78 HDL 43 LDL 91   4.SOB  02/2023 BNP 80 02/2023 echo: LVEF 60-65%, grade I dd,  - taking lasix 20mg  daily.  -sedentary, up 12 lbs since last year.     4. Preoperative evaluation - considering hernia surgery.   - recent SOB/DOE, nonspecific chest pains.      Past Medical History:  Diagnosis Date   CAD (coronary artery disease)    DOE (dyspnea on exertion)    Hyperlipidemia    Hypertension    S/P CABG (coronary artery bypass graft)    2022     No Known Allergies   Current Outpatient Medications  Medication Sig Dispense Refill   acetaminophen (TYLENOL) 500 MG tablet Take 500 mg by mouth daily.     amLODipine (NORVASC) 10 MG tablet Take 1 tablet (10 mg total) by mouth daily. 30 tablet 5   aspirin EC 81 MG tablet Take 81 mg by mouth daily. Swallow whole.     B Complex Vitamins (B COMPLEX-B12) TABS Take 1 tablet by mouth daily.     furosemide (LASIX) 20 MG tablet Take 1 tablet (20 mg total) by mouth daily as needed. 90 tablet 1   Metoprolol Tartrate 37.5 MG TABS Take 1 tablet (37.5 mg total) by mouth in the morning and at bedtime. 180 tablet 1   Pediatric Multivitamins-Iron (FLINTSTONES COMPLETE) 10 MG CHEW Chew 1 tablet by mouth daily.      rosuvastatin (CRESTOR) 40 MG tablet Take 1 tablet (40 mg total) by mouth daily at 8 pm. 90 tablet 1   No current facility-administered medications for this visit.     Past Surgical History:  Procedure Laterality Date   CORONARY ARTERY BYPASS GRAFT N/A 11/12/2021   Procedure: CORONARY ARTERY BYPASS GRAFTING (CABG) X TWO, USING LEFT INTERNAL MAMMARY ARTERY AND LEFT LEG GREATER SAPHENOUS VEIN - OPEN HARVEST;  Surgeon: Corliss Skains, MD;  Location: [facility] OR;  Service: Open Heart Surgery;  Laterality: N/A;   CORONARY BALLOON ANGIOPLASTY N/A 11/12/2021   Procedure: CORONARY BALLOON ANGIOPLASTY;  Surgeon: Marykay Lex, MD;  Location: [facility] INVASIVE CV LAB;  Service: Cardiovascular;  Laterality: N/A;   IABP INSERTION N/A 11/12/2021   Procedure: IABP INSERTION;  Surgeon: Marykay Lex, MD;  Location: [facility] INVASIVE CV LAB;  Service: Cardiovascular;  Laterality: N/A;   LEFT HEART CATH AND CORONARY ANGIOGRAPHY N/A 11/12/2021   Procedure: LEFT HEART CATH AND CORONARY ANGIOGRAPHY;  Surgeon: Marykay Lex, MD;  Location: [facility] INVASIVE CV LAB;  Service: Cardiovascular;  Laterality: N/A;   TEE WITHOUT CARDIOVERSION  11/12/2021   Procedure: TRANSESOPHAGEAL ECHOCARDIOGRAM (TEE);  Surgeon: Corliss Skains, MD;  Location: [facility] OR;  Service: Open Heart Surgery;;     No Known Allergies    Family History  Problem Relation Age of Onset   Heart attack Brother      Social History Mr. Yeates reports that he quit smoking about 23 months ago. His smoking use included cigarettes. He has never been exposed to tobacco smoke. He has never used smokeless tobacco. Mr. Ravens reports that he does not currently use alcohol.   Review of Systems CONSTITUTIONAL: No weight loss, fever, chills, weakness or fatigue.  HEENT: Eyes: No visual loss, blurred vision, double vision or yellow sclerae.No hearing loss, sneezing, congestion, runny nose or sore throat.  SKIN: No rash or itching.  CARDIOVASCULAR:  per hpi RESPIRATORY: per hpi GASTROINTESTINAL: No anorexia, nausea, vomiting or diarrhea. No abdominal pain or blood.  GENITOURINARY: No burning on urination, no polyuria NEUROLOGICAL: No headache, dizziness, syncope, paralysis, ataxia, numbness or tingling in the extremities. No change in bowel or bladder control.  MUSCULOSKELETAL: No muscle, back pain, joint pain or stiffness.  LYMPHATICS: No enlarged nodes. No history of splenectomy.  PSYCHIATRIC: No history of depression or anxiety.  ENDOCRINOLOGIC: No reports of sweating, cold or heat intolerance. No polyuria or polydipsia.  Marland Kitchen   Physical Examination Today's Vitals   10/19/23 0816  BP: 104/82  Pulse: 80  SpO2: 97%  Weight: 223 lb (101.2 kg)  Height: 6' (1.829 m)   Body mass index is 30.24 kg/m.  Gen: resting comfortably, no acute distress HEENT: no scleral icterus, pupils equal round and reactive, no palptable cervical adenopathy,  CV: RRR, no m/r,g no jvd Resp:faint crackles bilaterally GI: abdomen is soft, non-tender, non-distended, normal bowel sounds, no hepatosplenomegaly MSK: trace bilateral edema Skin: warm, no rash Neuro:  no focal deficits Psych: appropriate affect   Diagnostic Studies  10/2021 echo 1. Left ventricular ejection fraction, by estimation, is 60 to 65%. The  left ventricle has normal function. The left ventricle has no regional  wall motion abnormalities. There is moderate left ventricular hypertrophy.  Left ventricular diastolic  parameters are consistent with Grade I diastolic dysfunction (impaired  relaxation).   2. Right ventricular systolic function is normal. The right ventricular  size is normal. There is normal pulmonary artery systolic pressure.   3. The mitral valve is normal in structure. No evidence of mitral valve  regurgitation. No evidence of mitral stenosis.   4. The aortic valve is tricuspid. Aortic valve regurgitation is not  visualized. No aortic stenosis is present.   5.  The inferior vena cava is normal in size with greater than 50%  respiratory variability, suggesting right atrial pressure of 3 mmHg.    10/2021 cath Ost LM to Prox LAD lesion is 30% stenosed with 80% stenosed side Nessa Ramaker in Ost Cx.  Post intervention, there is a 50% residual stenosis with distal LM dissection.  Post attempted intervention, the OM side Havoc Sanluis was reduced to 80% residual stenosis.   Ramus-1 lesion is 40% stenosed. - mid 90%, distal mid thrombotic 90%.   Mid Cx to Dist Cx lesion is 95% stenosed. - plan was to attempt PCI   multiple wires were tried - unable to cross lesion   Post intervention, there is a 99% residual stenosis. TIMI 1 flow noted.   Prox RCA to Mid RCA lesion is 75% stenosed. non-dominant vessel   There is mild to moderate left ventricular systolic dysfunction.  The left ventricular ejection fraction is 45-50% by visual estimate.   LV end diastolic pressure is normal.  There is no aortic valve stenosis.   POST-OPERATIVE DIAGNOSIS:  Severe multivessel disease: Modest disease in the LAD, but heavily tortuous and calcified ramus intermedius as well as likely dominant LCx with severe disease :   Ramus takes a 90 degree turn shortly after that another 90 degree turn and there is a filling defect that appears to be either thrombotic or calcification.  The downstream vessel has pruning with what appears to be possible distal embolization. LCx is an unusual takeoff, there is diffuse moderate 70% disease followed by a hairpin turn where there is apparently an occluded OM 2 Llewellyn Choplin.  There is a 99% stenosis at this lesion that is heavily calcified. RCA appears to be nondominant heavily diseased small caliber vessel.            Original plan was to attempt PTCA/PCI of the LCx with Aggrastat infusion overnight and planned attempted PCI of the ramus intermedius on Friday. Attempted PTCA of the circumflex was performed using Prowater, Choice PT and whisper wires.  The vessel  was very difficult to wire and was not able to reach beyond the lesion.  Imaging after wire removal revealed that the vessel was now no longer subtotally occluded but appeared to have total occlusion with TIMI I flow distally.  At this point I chose to abort for further procedures.            On continued to of post wiring angiography, there appears to be dye hang up in the left main into the aortic root concerning for possible dissection.              Aggrastat bolus had been given but was discontinued.  CVTS consult called for potential emergent surgery.    EF appears to be 45 to 50%.  There appears to be apical inferior and mid to distal lateral-inferolateral hypokinesis. Normal LVEDP.   PATIENT DISPOSITION:  PACU - hemodynamically stable.            Stat echocardiogram called            CVTS consult call with Dr. Cliffton Asters -> likely emergent surgery.  Would like to consider CABG.            Antiplatelet agents have not stopped.     Assessment and Plan   1.CAD - was not started on ACE/ARB at d/c due to low bp's and AKI, more recently had elevated potassium level. Have not started at this time - chronic nonspecific chest paisn since CABG, monitor at this time. At this time don't see strong indication to repeat ischemic testing - continue current meds   2. HTN - he is at goal, continue current meds   3.SOB/DOE - recent echo normal EF, grade I dd. Normal BNP at the time - mild edema today, faint crackles. Will take his lasix bid - long smoking history, order PFTs - up 12 lbs, sedentary. Encouraged increased exercise - if progressive symptoms may consider repeat ischemic testing - f/u pcp CXR   4. Preoperative evaluation - considering possible hernia surgery. Would require stress test prior if he commits due to DOE with <4 METs     Antoine Poche, M.D.

## 2023-10-19 NOTE — Patient Instructions (Addendum)
Medication Instructions:   Begin Zetia 10mg  daily  Continue all other medications.     Labwork:  none  Testing/Procedures:  Your physician has recommended that you have a pulmonary function test. Pulmonary Function Tests are a group of tests that measure how well air moves in and out of your lungs.  Office will contact with results via phone, letter or mychart.     Follow-Up:  3 months   Any Other Special Instructions Will Be Listed Below (If Applicable).   If you need a refill on your cardiac medications before your next appointment, please call your pharmacy.

## 2023-11-25 DIAGNOSIS — M79675 Pain in left toe(s): Secondary | ICD-10-CM | POA: Diagnosis not present

## 2023-11-25 DIAGNOSIS — M79674 Pain in right toe(s): Secondary | ICD-10-CM | POA: Diagnosis not present

## 2023-11-25 DIAGNOSIS — B351 Tinea unguium: Secondary | ICD-10-CM | POA: Diagnosis not present

## 2023-11-30 ENCOUNTER — Ambulatory Visit (HOSPITAL_COMMUNITY)
Admission: RE | Admit: 2023-11-30 | Discharge: 2023-11-30 | Disposition: A | Discharge status: Home / Self Care | Payer: Medicare HMO | Source: Ambulatory Visit | Attending: Cardiology | Admitting: Cardiology

## 2023-11-30 DIAGNOSIS — R0602 Shortness of breath: Secondary | ICD-10-CM | POA: Insufficient documentation

## 2023-11-30 LAB — PULMONARY FUNCTION TEST
DL/VA % pred: 63 %
DL/VA: 2.51 ml/min/mmHg/L
DLCO unc % pred: 35 %
DLCO unc: 9.67 ml/min/mmHg
FEF 25-75 Post: 0.82 L/s
FEF 25-75 Pre: 0.63 L/s
FEF2575-%Change-Post: 29 %
FEF2575-%Pred-Post: 30 %
FEF2575-%Pred-Pre: 23 %
FEV1-%Change-Post: 9 %
FEV1-%Pred-Post: 48 %
FEV1-%Pred-Pre: 43 %
FEV1-Post: 1.69 L
FEV1-Pre: 1.55 L
FEV1FVC-%Change-Post: 4 %
FEV1FVC-%Pred-Pre: 76 %
FEV6-%Change-Post: 7 %
FEV6-%Pred-Post: 61 %
FEV6-%Pred-Pre: 57 %
FEV6-Post: 2.77 L
FEV6-Pre: 2.58 L
FEV6FVC-%Change-Post: 1 %
FEV6FVC-%Pred-Post: 102 %
FEV6FVC-%Pred-Pre: 100 %
FVC-%Change-Post: 4 %
FVC-%Pred-Post: 59 %
FVC-%Pred-Pre: 57 %
FVC-Post: 2.86 L
FVC-Pre: 2.73 L
Post FEV1/FVC ratio: 59 %
Post FEV6/FVC ratio: 97 %
Pre FEV1/FVC ratio: 57 %
Pre FEV6/FVC Ratio: 96 %
RV % pred: 92 %
RV: 2.38 L
TLC % pred: 68 %
TLC: 5.09 L

## 2023-11-30 MED ORDER — ALBUTEROL SULFATE (2.5 MG/3ML) 0.083% IN NEBU
2.5000 mg | INHALATION_SOLUTION | Freq: Once | RESPIRATORY_TRACT | Status: AC
  Administered 2023-11-30: 2.5 mg via RESPIRATORY_TRACT

## 2023-12-13 IMAGING — CR DG CHEST 2V
2 series · 2 of 2 positions shown · non-contrast
Comparison: 12/30/2021

CLINICAL DATA: CABG 11/12/2021.

EXAM:
CHEST - 2 VIEW

[w chest pa]
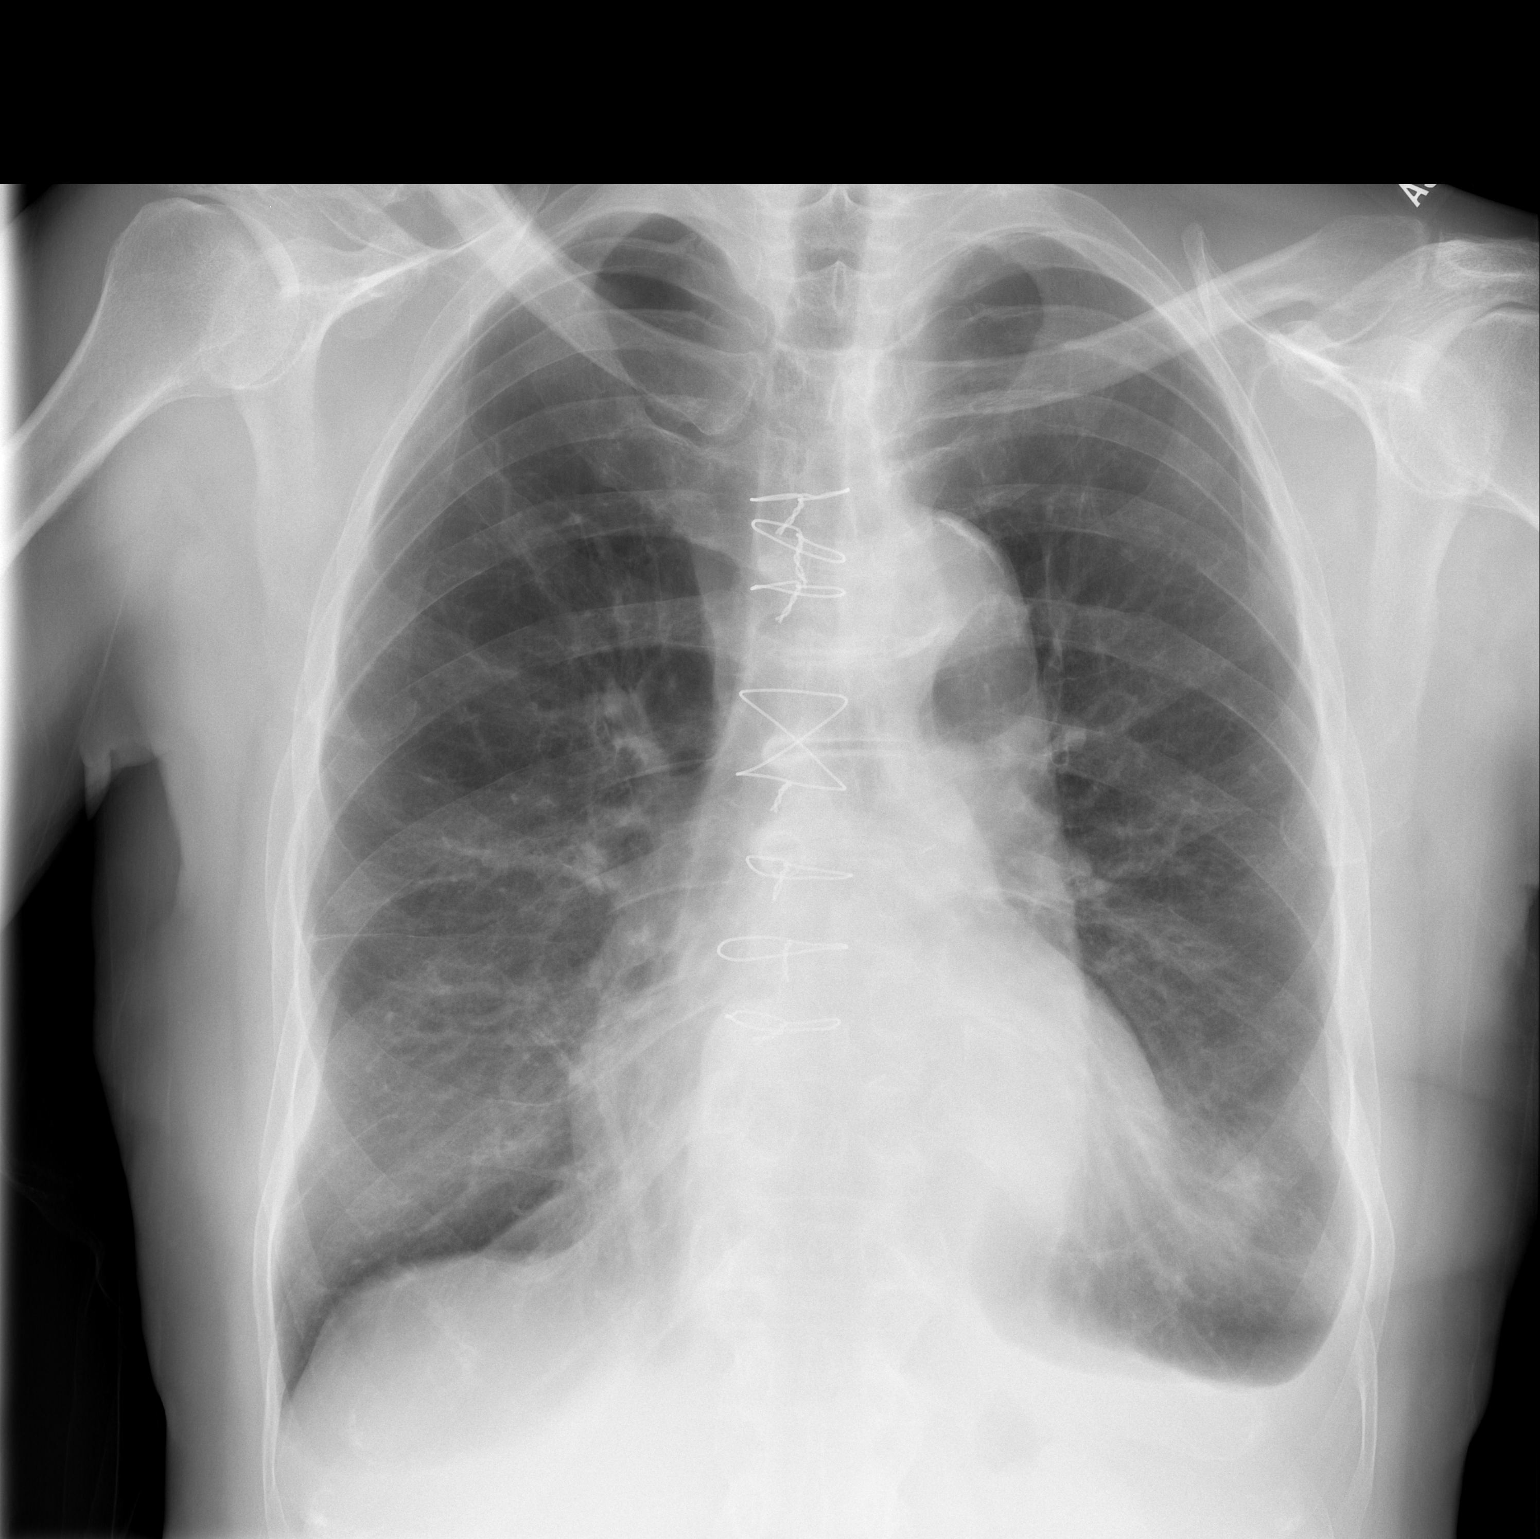

[w chest lat]
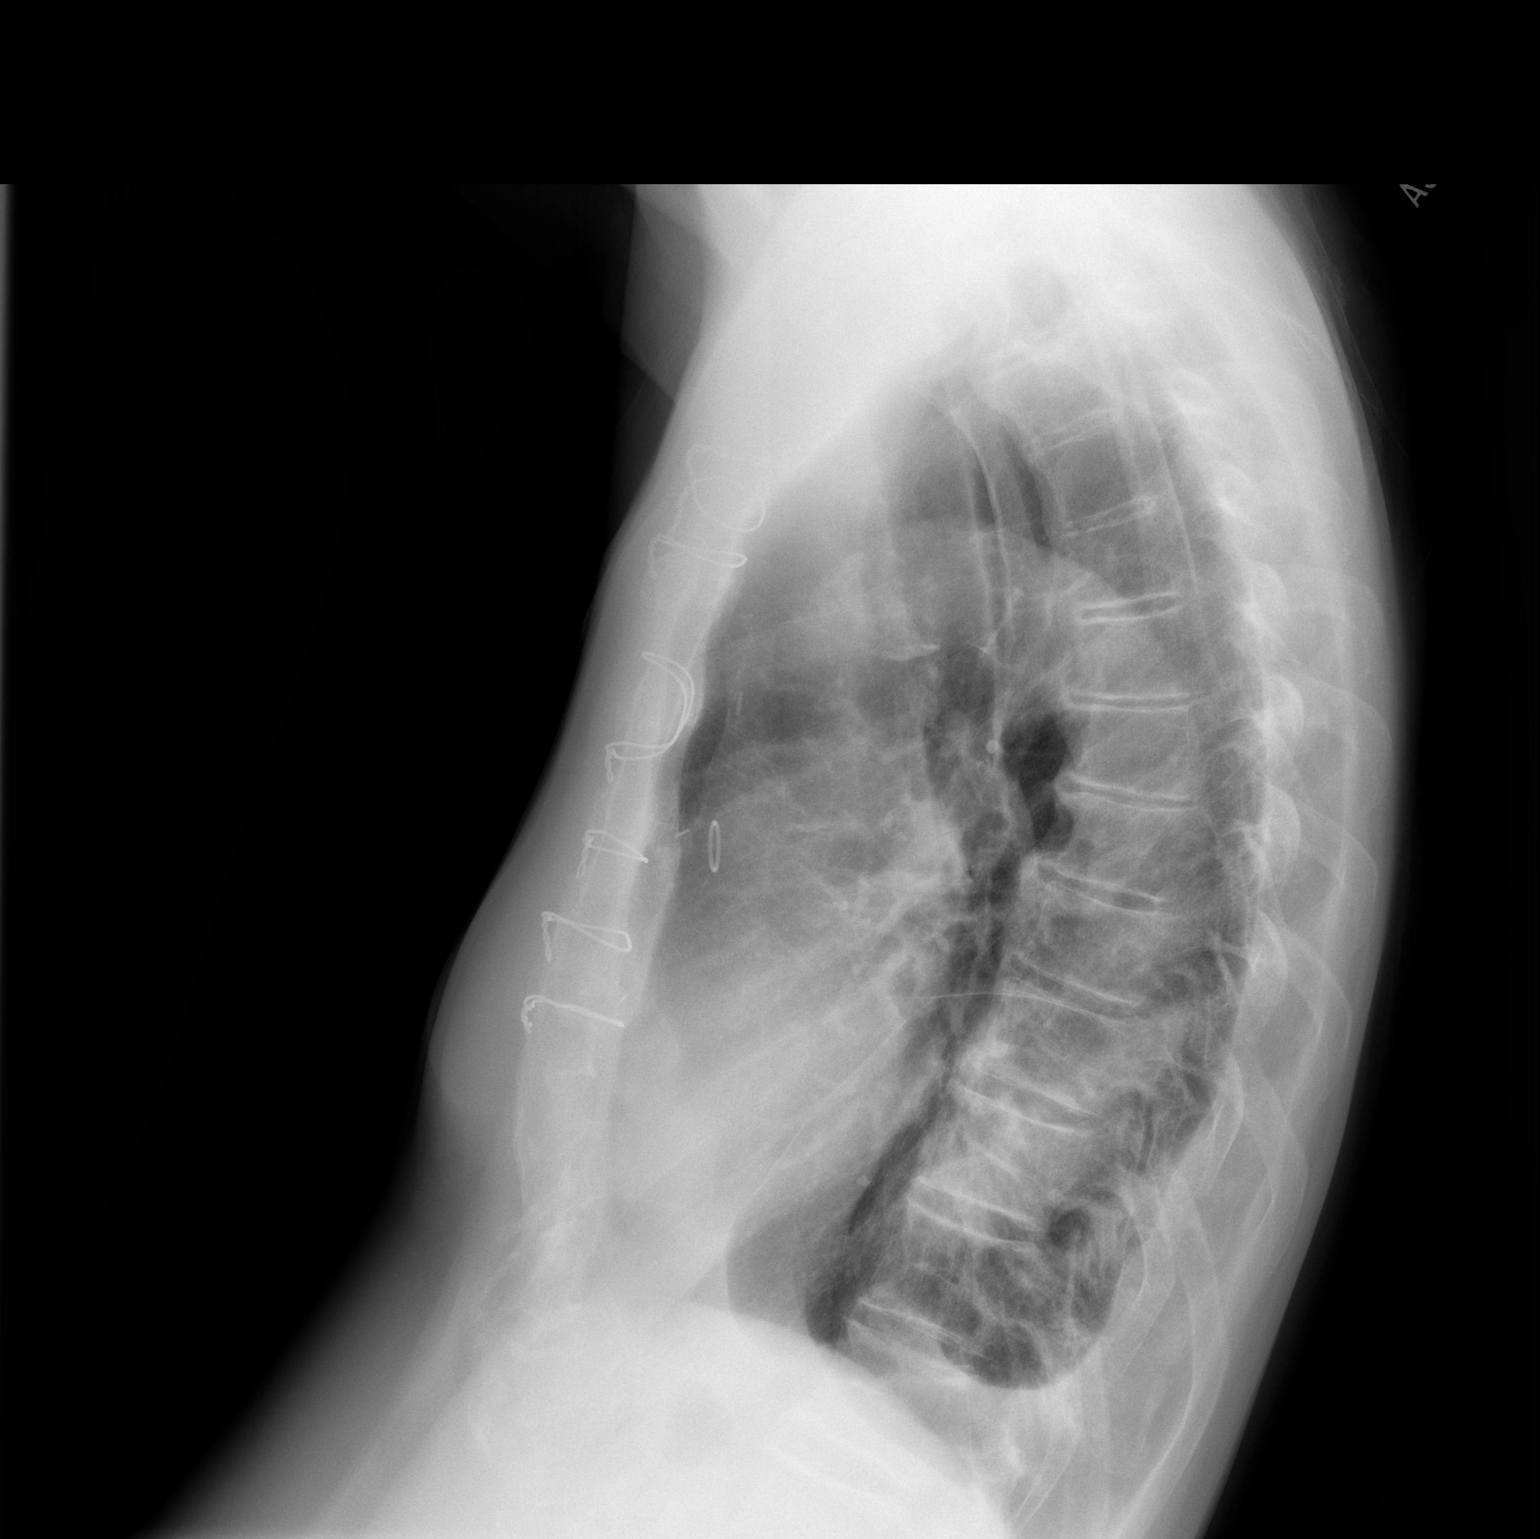

[2 of 2 positions shown; findings below may reference images not displayed]

FINDINGS: Postop CABG. Heart size within normal limits. Negative for heart
failure

Improving left pleural effusion which is now small. Mild left lower
lobe atelectasis also with improvement.

Interval resolution of minimal pleural effusion from the prior
study.

Chronic fracture deformity distal right clavicle.
IMPRESSION: Improving bilateral pleural effusions left greater than right.
Improvement in left lower lobe atelectasis.

## 2023-12-21 ENCOUNTER — Other Ambulatory Visit: Payer: Self-pay | Admitting: Cardiology

## 2023-12-21 DIAGNOSIS — J449 Chronic obstructive pulmonary disease, unspecified: Secondary | ICD-10-CM

## 2023-12-21 DIAGNOSIS — R0602 Shortness of breath: Secondary | ICD-10-CM

## 2024-01-20 ENCOUNTER — Encounter: Payer: Self-pay | Admitting: Nurse Practitioner

## 2024-01-20 ENCOUNTER — Ambulatory Visit: Payer: Medicare HMO | Attending: Nurse Practitioner | Admitting: Nurse Practitioner

## 2024-01-20 VITALS — BP 124/80 | HR 78 | Ht 72.0 in | Wt 229.0 lb

## 2024-01-20 DIAGNOSIS — E669 Obesity, unspecified: Secondary | ICD-10-CM | POA: Diagnosis not present

## 2024-01-20 DIAGNOSIS — I2510 Atherosclerotic heart disease of native coronary artery without angina pectoris: Secondary | ICD-10-CM | POA: Diagnosis not present

## 2024-01-20 DIAGNOSIS — E785 Hyperlipidemia, unspecified: Secondary | ICD-10-CM

## 2024-01-20 DIAGNOSIS — J449 Chronic obstructive pulmonary disease, unspecified: Secondary | ICD-10-CM

## 2024-01-20 DIAGNOSIS — I10 Essential (primary) hypertension: Secondary | ICD-10-CM | POA: Diagnosis not present

## 2024-01-20 DIAGNOSIS — R0609 Other forms of dyspnea: Secondary | ICD-10-CM | POA: Diagnosis not present

## 2024-01-20 MED ORDER — METOPROLOL TARTRATE 37.5 MG PO TABS: 1.0000 | ORAL_TABLET | Freq: Two times a day (BID) | ORAL | 1 refills | Status: AC

## 2024-01-20 NOTE — Progress Notes (Addendum)
Office Visit    Patient Name: Dean Mendez Date of Encounter: 01/20/2024 PCP:  Assunta Found, MD [facility]  Cardiologist:  Dina Rich, MD  Advanced Practice Provider:  Sharlene Dory, NP Electrophysiologist:  None 46}  Chief Complaint and HPI    Dean Mendez is a 73 y.o. male with a hx of CAD, s/p NSTEMI and CABG in 2022, hyperlipidemia, hypertension, history of chest pain, COPD, and former smoker, who presents today for follow-up.  Previous cardiovascular history includes NSTEMI in 2022, TTE revealed EF 60 to 65%, grade 1 DD, PCI attempt complicated by LM dissection, referred for CABG.  Underwent CABG later on, R SVG-RI, LIMA-LAD.  Last seen by Dr. Dina Rich on October 19, 2023.  He noted chronic nonspecific chest pain since his CABG.  Dr. Wyline Mood recommended monitoring at the time as there was not a strong indication to repeat ischemic testing.  He noted shortness of breath/DOE.  Recent echo at the time revealed normal EF, grade 1 DD with normal BNP.  On exam, he had mild edema with faint crackles noted.  Weight was up 12 pounds.  Was instructed to take Lasix twice daily, PFTs were arranged.  At the time, was considering possible hernia surgery Dr. Wyline Mood recommended requiring stress test prior if he committed to this due to his dyspnea on exertion and less than 4 METS of activity.  His PFTs suggested severe COPD, was referred to pulmonary for evaluation. Chest X-ray was WNL.  Today he presents for follow-up.  States his shortness of breath is stable.  Tells me he is scheduled to see pulmonology soon for office evaluation. Denies any chest pain, palpitations, syncope, presyncope, dizziness, orthopnea, PND, swelling or significant weight changes, acute bleeding, or claudication.  He stated he will let us know in the future if he needs undergo hernia procedure.  Agree with Dr. Wyline Mood to consider arranging stress test prior if he commits to  this surgery due to his dyspnea on exertion and limited activity.  ROS: Negative.  See HPI. SH: Has been married to his wife for around 24 years. EKGs/Labs/Other Studies Reviewed:   The following studies were reviewed today:   EKG:  EKG is not ordered today.  Echo 02/2023:  1. Left ventricular ejection fraction, by estimation, is 60 to 65%. The  left ventricle has normal function. Left ventricular endocardial border  not optimally defined to evaluate regional wall motion. There is mild left  ventricular hypertrophy. Left  ventricular diastolic parameters are consistent with Grade I diastolic  dysfunction (impaired relaxation). The average left ventricular global  longitudinal strain is -19.0 %. The global longitudinal strain is normal.   2. Right ventricular systolic function is normal. The right ventricular  size is normal. There is normal pulmonary artery systolic pressure.   3. The mitral valve is abnormal. No evidence of mitral valve  regurgitation. No evidence of mitral stenosis.   4. The aortic valve is tricuspid. There is mild calcification of the  aortic valve. Aortic valve regurgitation is not visualized. No aortic  stenosis is present.   5. The inferior vena cava is normal in size with greater than 50%  respiratory variability, suggesting right atrial pressure of 3 mmHg.   Comparison(s): No significant change from prior study.  LHC 10/2021:   Ost LM to Prox LAD lesion is 30% stenosed with 80% stenosed side branch in Ost Cx.  Post intervention, there is a 50% residual stenosis with distal LM dissection.  Post attempted intervention, the OM side branch was reduced to 80% residual stenosis.   Ramus-1 lesion is 40% stenosed. - mid 90%, distal mid thrombotic 90%.   Mid Cx to Dist Cx lesion is 95% stenosed. - plan was to attempt PCI   multiple wires were tried - unable to cross lesion   Post intervention, there is a 99% residual stenosis. TIMI 1 flow noted.   Prox RCA to Mid  RCA lesion is 75% stenosed. non-dominant vessel   There is mild to moderate left ventricular systolic dysfunction.  The left ventricular ejection fraction is 45-50% by visual estimate.   LV end diastolic pressure is normal.   There is no aortic valve stenosis.   POST-OPERATIVE DIAGNOSIS:  Severe multivessel disease: Modest disease in the LAD, but heavily tortuous and calcified ramus intermedius as well as likely dominant LCx with severe disease :   Ramus takes a 90 degree turn shortly after that another 90 degree turn and there is a filling defect that appears to be either thrombotic or calcification.  The downstream vessel has pruning with what appears to be possible distal embolization. LCx is an unusual takeoff, there is diffuse moderate 70% disease followed by a hairpin turn where there is apparently an occluded OM 2 branch.  There is a 99% stenosis at this lesion that is heavily calcified. RCA appears to be nondominant heavily diseased small caliber vessel.            Original plan was to attempt PTCA/PCI of the LCx with Aggrastat infusion overnight and planned attempted PCI of the ramus intermedius on Friday. Attempted PTCA of the circumflex was performed using [place], Choice PT and whisper wires.  The vessel was very difficult to wire and was not able to reach beyond the lesion.  Imaging after wire removal revealed that the vessel was now no longer subtotally occluded but appeared to have total occlusion with TIMI I flow distally.  At this point I chose to abort for further procedures.            On continued to of post wiring angiography, there appears to be dye hang up in the left main into the aortic root concerning for possible dissection.              Aggrastat bolus had been given but was discontinued.  CVTS consult called for potential emergent surgery.    EF appears to be 45 to 50%.  There appears to be apical inferior and mid to distal lateral-inferolateral  hypokinesis. Normal LVEDP.   PATIENT DISPOSITION:  PACU - hemodynamically stable.            Stat echocardiogram called            CVTS consult call with Dr. Cliffton Asters -> likely emergent surgery.  Would like to consider CABG.            Antiplatelet agents have not stopped.     PLAN OF CARE:  The patient has been already been admitted to 6 E., however, the plan after discussion with Dr. Cliffton Asters is to place IABP & go to OR   Review of Systems    All other systems reviewed and are otherwise negative except as noted above.  Physical Exam    VS:  BP 124/80   Pulse 78   Ht 6' (1.829 m)   Wt 229 lb (103.9 kg)   SpO2 95%   BMI 31.06 kg/m  , BMI Body mass index is 31.06  kg/m.  Wt Readings from Last 3 Encounters:  01/20/24 229 lb (103.9 kg)  10/19/23 223 lb (101.2 kg)  04/13/23 215 lb (97.5 kg)     GEN: Well nourished, well developed, in no acute distress. HEENT: normal. Neck: Supple, no JVD, carotid bruits, or masses. Cardiac: S1/S2, RRR, no murmurs, rubs, or gallops. No clubbing, cyanosis, edema.  Radials/PT 2+ and equal bilaterally.  Respiratory:  Respirations regular and unlabored, clear to auscultation bilaterally. MS: No deformity or atrophy. Skin: Warm and dry, no rash. Neuro:  Strength and sensation are intact. Psych: Normal affect.  Assessment & Plan    CAD, s/p NSTEMI and CABG Stable with no anginal symptoms. No indication for ischemic evaluation. Continue Aspirin, Zetia, Lopressor, and Crestor.  Heart healthy diet and regular cardiovascular exercise encouraged.   Hyperlipidemia Lipid panel from 09/2022 was overall unremarkable. Continue Crestor. Heart healthy diet and regular cardiovascular exercise encouraged.  Will request most recent labs from Dr. Lamar Blinks office.  Hypertension BP stable. BP well controlled at home.  Continue amlodipine. Discussed to monitor BP at home at least 2 hours after medications and sitting for 5-10 minutes. Heart healthy diet  and regular cardiovascular exercise encouraged.   4.  COPD, DOE Past pulmonary function test ordered by Dr. Wyline Mood revealed severe COPD.  Denies any chest pain most likely etiology of his dyspnea on exertion is related to COPD.  Appears euvolemic and well compensated on exam, does not appear volume overloaded.  He is a former smoker.  Scheduled to see Dr. Sherene Sires with pulmonology soon.  No medication changes at this time.  Follow-up with pulmonology.  Care and ED precautions discussed.  5. Obesity Weight loss via diet and exercise encouraged. Discussed the impact being overweight would have on cardiovascular risk.  Disposition: Will refill Lopressor per his request. Follow up in 6 months with Dina Rich, MD or APP.  Signed, Sharlene Dory, NP 01/20/2024, 9:45 AM [facility]

## 2024-01-20 NOTE — Patient Instructions (Addendum)

## 2024-01-25 ENCOUNTER — Encounter: Payer: Self-pay | Admitting: Family Medicine

## 2024-01-26 NOTE — Progress Notes (Signed)
 Dean Mendez, male    DOB: 1951-08-25    MRN: [id number]   Brief patient profile:  72  yobm  quit smoking 10/2021 with cabg @ 193 lb referred to pulmonary clinic in [place]  01/27/2024 by Dr Alvan  for doe      History of Present Illness  01/27/2024  Pulmonary/ 1st office eval/ Dean Mendez / Dean Mendez Office @ 229  Chief Complaint  Patient presents with   Consult  Dyspnea:  MMRC2 = can't walk a nl pace on a flat grade s sob but does fine slow and flat eg shopping  Cough: none  Sleep: recliner since cabg 45 degrees  SABA use: none  02: none     No obvious day to day or daytime pattern/variability or assoc excess/ purulent sputum or mucus plugs or hemoptysis or cp or chest tightness, subjective wheeze or overt sinus or hb symptoms.    Also denies any obvious fluctuation of symptoms with weather or environmental changes or other aggravating or alleviating factors except as outlined above   No unusual exposure hx or h/o childhood pna/ asthma or knowledge of premature birth.  Current Allergies, Complete Past Medical History, Past Surgical History, Family History, and Social History were reviewed in [employer] record.  ROS  The following are not active complaints unless bolded Hoarseness, sore throat, dysphagia, dental problems, itching, sneezing,  nasal congestion or discharge of excess mucus or purulent secretions, ear ache,   fever, chills, sweats, unintended wt loss or wt gain, classically pleuritic or exertional cp,  orthopnea pnd or arm/hand swelling  or leg swelling, presyncope, palpitations, abdominal pain, anorexia, nausea, vomiting, diarrhea  or change in bowel habits or change in bladder habits, change in stools or change in urine, dysuria, hematuria,  rash, arthralgias, visual complaints, headache, numbness, weakness or ataxia or problems with walking or coordination,  change in mood or  memory.            Outpatient Medications Prior to Visit   Medication Sig Dispense Refill   acetaminophen  (TYLENOL ) 500 MG tablet Take 500 mg by mouth daily.     amLODipine  (NORVASC ) 10 MG tablet Take 1 tablet (10 mg total) by mouth daily. 30 tablet 5   aspirin  EC 81 MG tablet Take 81 mg by mouth daily. Swallow whole.     B Complex Vitamins (B COMPLEX-B12) TABS Take 1 tablet by mouth daily.     ezetimibe  (ZETIA ) 10 MG tablet Take 1 tablet (10 mg total) by mouth daily. 30 tablet 6   furosemide  (LASIX ) 20 MG tablet Take 1 tablet (20 mg total) by mouth daily as needed. 90 tablet 1   Metoprolol  Tartrate 37.5 MG TABS Take 1 tablet (37.5 mg total) by mouth in the morning and at bedtime. 180 tablet 1   Pediatric Multivitamins-Iron (FLINTSTONES COMPLETE) 10 MG CHEW Chew 1 tablet by mouth daily.     rosuvastatin  (CRESTOR ) 40 MG tablet Take 1 tablet (40 mg total) by mouth daily at 8 pm. 90 tablet 1   No facility-administered medications prior to visit.    Past Medical History:  Diagnosis Date   CAD (coronary artery disease)    DOE (dyspnea on exertion)    Hyperlipidemia    Hypertension    S/P CABG (coronary artery bypass graft)    2022      Objective:     BP 120/77   Pulse 82   Ht 6' (1.829 m)   Wt 229 lb (103.9 kg)  SpO2 90% Comment: room air  BMI 31.06 kg/m   SpO2: 90 % (room air)  Pleasant amb bm nad   HEENT : Oropharynx  clear / edentulous   Nasal turbinates nl    NECK :  without  apparent JVD/ palpable Nodes/TM    LUNGS: no acc muscle use,  Mild barrel  contour chest wall with bilateral  Distant bs s audible wheeze and  without cough on insp or exp maneuvers  and mild  Hyperresonant  to  percussion bilaterally     CV:  RRR  no s3 or murmur or increase in P2, and trace pitting both LEs  ABD:  soft and nontender with pos end  insp Hoover's  in the supine position.  No bruits or organomegaly appreciated - large L IH present   MS:  Nl gait/ ext warm without deformities Or obvious joint restrictions  calf tenderness, cyanosis -   Mild  clubbing     SKIN: warm and dry without lesions    NEURO:  alert, approp, nl sensorium with  no motor or cerebellar deficits apparent.        Assessment   COPD  gold 3/ Group B/ emphysema Quit smoking  10/2021  - PFT's  11/30/23   FEV1 1/69 (48 % ) ratio 0.59  p 12 % improvement from saba p 02 prior to study with DLCO  9.67 (35%)   and FV curve classically concave   - 01/27/2024   Walked on RA  x  3  lap(s) =  approx 450  ft  @ mod pace, stopped due to end of study  with lowest 02 sats 89%    Pt is Group B in terms of symptom/risk and laba/lama therefore appropriate rx at this point >>>  trelegy 100 sample and then start anoro 1 click each am   01/27/2024  After extensive coaching inhaler device,  effectiveness =    90% with dpi   Each maintenance medication was reviewed in detail including emphasizing most importantly the difference between maintenance and prns and under what circumstances the prns are to be triggered using an action plan format where appropriate.  Total time for H and P, chart review, counseling, reviewing dpi device(s) , directly observing portions of ambulatory 02 saturation study/ and generating customized AVS unique to this office visit / same day charting = 45 min new pt eval                      Dean America, MD 01/27/2024

## 2024-01-27 ENCOUNTER — Encounter: Payer: Self-pay | Admitting: Internal Medicine

## 2024-01-27 ENCOUNTER — Ambulatory Visit: Payer: Medicare HMO | Admitting: Internal Medicine

## 2024-01-27 VITALS — BP 120/77 | HR 82 | Ht 72.0 in | Wt 229.0 lb

## 2024-01-27 DIAGNOSIS — J449 Chronic obstructive pulmonary disease, unspecified: Secondary | ICD-10-CM | POA: Diagnosis not present

## 2024-01-27 MED ORDER — TRELEGY ELLIPTA 100-62.5-25 MCG/ACT IN AEPB: 1.0000 | INHALATION_SPRAY | Freq: Once | RESPIRATORY_TRACT | Status: AC

## 2024-01-27 MED ORDER — ANORO ELLIPTA 62.5-25 MCG/ACT IN AEPB: 1.0000 | INHALATION_SPRAY | Freq: Every day | RESPIRATORY_TRACT | 11 refills | Status: AC

## 2024-01-27 NOTE — Assessment & Plan Note (Addendum)
 Quit smoking  10/2021  - PFT's  11/30/23   FEV1 1/69 (48 % ) ratio 0.59  p 12 % improvement from saba p 02 prior to study with DLCO  9.67 (35%)   and FV curve classically concave   - 01/27/2024   Walked on RA  x  3  lap(s) =  approx 450  ft  @ mod pace, stopped due to end of study  with lowest 02 sats 89%    Pt is Group B in terms of symptom/risk and laba/lama therefore appropriate rx at this point >>>  trelegy 100 sample and then start anoro 1 click each am   01/27/2024  After extensive coaching inhaler device,  effectiveness =    90% with dpi   Each maintenance medication was reviewed in detail including emphasizing most importantly the difference between maintenance and prns and under what circumstances the prns are to be triggered using an action plan format where appropriate.  Total time for H and P, chart review, counseling, reviewing dpi device(s) , directly observing portions of ambulatory 02 saturation study/ and generating customized AVS unique to this office visit / same day charting = 45 min new pt eval

## 2024-01-27 NOTE — Patient Instructions (Addendum)
 Plan A = Automatic = Always=    Trelegy(ANORO = [place])  100 one click each am   Work on inhaler technique:  relax and gently blow all the way out then take a nice smooth full deep breath back in.   Hold breath in for at least  5 seconds if you can.  Rinse and gargle with water when done.  If mouth or throat bother you at all,  try brushing teeth/gums/tongue with arm and hammer toothpaste/ make a slurry and gargle and spit out.      Plan B = Backup (to supplement plan A, not to replace it) Only use your albuterol  inhaler as a rescue medication to be used if you can't catch your breath by resting or doing a relaxed purse lip breathing pattern.  - The less you use it, the better it will work when you need it. - Ok to use the inhaler up to 2 puffs  every 4 hours if you must but call for appointment if use goes up over your usual need - Don't leave home without it !!  (think of it like the spare tire for your car)     Please schedule a follow up visit in 3 months but call sooner if needed

## 2024-01-28 ENCOUNTER — Encounter: Payer: Self-pay | Admitting: Internal Medicine

## 2024-02-03 DIAGNOSIS — M79675 Pain in left toe(s): Secondary | ICD-10-CM | POA: Diagnosis not present

## 2024-02-03 DIAGNOSIS — B351 Tinea unguium: Secondary | ICD-10-CM | POA: Diagnosis not present

## 2024-02-03 DIAGNOSIS — M79674 Pain in right toe(s): Secondary | ICD-10-CM | POA: Diagnosis not present

## 2024-04-13 DIAGNOSIS — B351 Tinea unguium: Secondary | ICD-10-CM | POA: Diagnosis not present

## 2024-04-13 DIAGNOSIS — M79674 Pain in right toe(s): Secondary | ICD-10-CM | POA: Diagnosis not present

## 2024-04-13 DIAGNOSIS — M79675 Pain in left toe(s): Secondary | ICD-10-CM | POA: Diagnosis not present

## 2024-04-24 NOTE — Progress Notes (Unsigned)
 Dean Mendez, male    DOB: [birth year]    MRN: [id number]   Brief patient profile:  38  yobm  quit smoking 10/2021 with cabg @ 193 lb referred to pulmonary clinic in [place]  01/27/2024 by Dr Amanda Jungling  for doe   - PFT's  11/30/23   FEV1 1.69 (48 % ) ratio 0.59  p 12 % improvement from saba p 02 prior to study with DLCO  9.67 (35%)   and FV curve classically concave     History of Present Illness  01/27/2024  Pulmonary/ 1st office eval/ [place] Hailey / [place] [facility] @ 229  Chief Complaint  Patient presents with   Consult  Dyspnea:  MMRC2 = can't walk a nl pace on a flat grade s sob but does fine slow and flat eg shopping  Cough: none  Sleep: recliner since cabg 45 degrees  SABA use: none  02: none  Rec Plan A = Automatic = Always=    Trelegy(ANORO = [place])  100 one click each am  Work on inhaler technique:     Plan B = Backup (to supplement plan A, not to replace it) Only use your albuterol  inhaler as a rescue medication   04/25/2024  f/u ov/[place] office/Tenise Stetler re: GOLD 3 copd/goup B  no better from Trelegy  Chief Complaint  Patient presents with   COPD  Dyspnea:  better including hills / pushing mower x 15 min  Cough: none/ slt raspy though on ACEi  Sleeping: still in recliner x 2 years x 45 degrees due to easier to get up and down      SABA use: none  02: none  Lung cancer screening: rec   No obvious day to day or daytime variability or assoc excess/ purulent sputum or mucus plugs or hemoptysis or cp or chest tightness, subjective wheeze or overt sinus or hb symptoms.    Also denies any obvious fluctuation of symptoms with weather or environmental changes or other aggravating or alleviating factors except as outlined above   No unusual exposure hx or h/o childhood pna/ asthma or knowledge of premature birth.  Current Allergies, Complete Past Medical History, Past Surgical History, Family History, and Social History were reviewed in [employer] record.  ROS  The following are not active complaints unless bolded Hoarseness, sore throat, dysphagia, dental problems, itching, sneezing,  nasal congestion or discharge of excess mucus or purulent secretions, ear ache,   fever, chills, sweats, unintended wt loss or wt gain, classically pleuritic or exertional cp,  orthopnea pnd or arm/hand swelling  or leg swelling, presyncope, palpitations, abdominal pain, anorexia, nausea, vomiting, diarrhea  or change in bowel habits or change in bladder habits, change in stools or change in urine, dysuria, hematuria,  rash, arthralgias, visual complaints, headache, numbness, weakness or ataxia or problems with walking or coordination,  change in mood or  memory.        Current Meds  Medication Sig   acetaminophen  (TYLENOL ) 500 MG tablet Take 500 mg by mouth daily.   amLODipine  (NORVASC ) 10 MG tablet Take 1 tablet (10 mg total) by mouth daily.   aspirin  EC 81 MG tablet Take 81 mg by mouth daily. Swallow whole.   B Complex Vitamins (B COMPLEX-B12) TABS Take 1 tablet by mouth daily.   ezetimibe  (ZETIA ) 10 MG tablet Take 1 tablet (10 mg total) by mouth daily.   furosemide  (LASIX ) 20 MG tablet Take 1 tablet (20 mg total) by mouth daily as  needed.   lisinopril -hydrochlorothiazide  (ZESTORETIC ) 20-12.5 MG tablet    Metoprolol  Tartrate 37.5 MG TABS Take 1 tablet (37.5 mg total) by mouth in the morning and at bedtime.   Pediatric Multivitamins-Iron (FLINTSTONES COMPLETE) 10 MG CHEW Chew 1 tablet by mouth daily.   rosuvastatin  (CRESTOR ) 40 MG tablet Take 1 tablet (40 mg total) by mouth daily at 8 pm.   umeclidinium-vilanterol (ANORO ELLIPTA ) 62.5-25 MCG/ACT AEPB Inhale 1 puff into the lungs daily.            Past Medical History:  Diagnosis Date   CAD (coronary artery disease)    DOE (dyspnea on exertion)    Hyperlipidemia    Hypertension    S/P CABG (coronary artery bypass graft)    2022      Objective:    Wts   04/25/2024         224    01/27/24 229 lb (103.9 kg)  01/20/24 229 lb (103.9 kg)  10/19/23 223 lb (101.2 kg)    Vital signs reviewed  04/25/2024  - Note at rest 02 sats  92% on RA   General appearance:    amb bm nad/ slt raspy voice/ occ throat clearing   HEENT : Oropharynx  clear/ edentulous     NECK :  without  apparent JVD/ palpable Nodes/TM    LUNGS: no acc muscle use,  Min barrel  contour chest wall with bilateral  slightly decreased bs s audible wheeze and  without cough on insp or exp maneuvers and min  Hyperresonant  to  percussion bilaterally    CV:  RRR  no s3 or murmur or increase in P2, and no edema   ABD:  soft and nontender- large L IH no change from prior exams.  MS:  Nl gait/ ext warm without deformities Or obvious joint restrictions  calf tenderness, cyanosis - very mild clubbing     SKIN: warm and dry without lesions    NEURO:  alert, approp, nl sensorium with  no motor or cerebellar deficits apparent.              Assessment

## 2024-04-25 ENCOUNTER — Ambulatory Visit: Payer: Medicare HMO | Admitting: Internal Medicine

## 2024-04-25 ENCOUNTER — Encounter: Payer: Self-pay | Admitting: Internal Medicine

## 2024-04-25 VITALS — BP 116/75 | HR 71 | Ht 72.0 in | Wt 224.6 lb

## 2024-04-25 DIAGNOSIS — J449 Chronic obstructive pulmonary disease, unspecified: Secondary | ICD-10-CM | POA: Diagnosis not present

## 2024-04-25 DIAGNOSIS — Z87891 Personal history of nicotine dependence: Secondary | ICD-10-CM | POA: Diagnosis not present

## 2024-04-25 NOTE — Assessment & Plan Note (Signed)
 Quit smoking  10/2021  - PFT's  11/30/23   FEV1 1.69 (48 % ) ratio 0.59  p 12 % improvement from saba p 02 prior to study with DLCO  9.67 (35%)   and FV curve classically concave   - 01/27/2024   Walked on RA  x  3  lap(s) =  approx 450  ft  @ mod pace, stopped due to end of study  with lowest 02 sats 89%    Pt is Group B in terms of symptom/risk and laba/lama therefore appropriate rx at this point >>>  anoro or bevesopi or stiolto  but presently pt says he's not really limited by doe or having any tendency to aecopd so not interested in maint rx and ok with me  to f/u with PCP  and here prn

## 2024-04-25 NOTE — Assessment & Plan Note (Signed)
 Quit smoking in 2022  -  eligible for LDSCT until age 73 > deferred to PCP as won't plan to follow in Pulmonary clinic.  Discussed in detail all the  indications, usual  risks and alternatives  relative to the benefits with patient who agrees to proceed with w/u as outlined.         Each maintenance medication was reviewed in detail including emphasizing most importantly the difference between maintenance and prns and under what circumstances the prns are to be triggered using an action plan format where appropriate.  Total time for H and P, chart review, counseling, reviewing dpi device(s) and generating customized AVS unique to this office visit / same day charting = 32 min summary final f/u pulmonary ov

## 2024-04-25 NOTE — Patient Instructions (Addendum)
 No need for inhalers -  beware that lisinopril  may make your cough and hoarseness worse but it's so mild right now there is no reason to change it out to Valsartan 160-12.5   I recommend a CT yearly until age 73 as part of your annual physical per PCP  Follow up here can be as needed

## 2024-06-09 ENCOUNTER — Other Ambulatory Visit: Payer: Self-pay | Admitting: Nurse Practitioner

## 2024-06-20 DIAGNOSIS — M79674 Pain in right toe(s): Secondary | ICD-10-CM | POA: Diagnosis not present

## 2024-06-20 DIAGNOSIS — B351 Tinea unguium: Secondary | ICD-10-CM | POA: Diagnosis not present

## 2024-06-21 NOTE — Telephone Encounter (Signed)
   1. Which medications need to be refilled? (please list name of each medication and dose if known) furosemide  (LASIX ) 20 MG tablet    2. Would you like to learn more about the convenience, safety, & potential cost savings by using the [facility]? No       3. Are you open to using the [facility] (Type [facility]. No   4. Which pharmacy/location (including street and city if local pharmacy) is medication to be sent to?[facility] [place]    5. Do they need a 30 day or 90 day supply? 90

## 2024-06-22 MED ORDER — FUROSEMIDE 20 MG PO TABS: 20.0000 mg | ORAL_TABLET | Freq: Every day | ORAL | 1 refills | Status: AC | PRN

## 2024-06-22 NOTE — Telephone Encounter (Signed)
 Pt's medications were sent to pt's pharmacy as requested. Confirmation received.

## 2024-06-29 DIAGNOSIS — Z008 Encounter for other general examination: Secondary | ICD-10-CM | POA: Diagnosis not present

## 2024-07-05 DIAGNOSIS — Z6831 Body mass index (BMI) 31.0-31.9, adult: Secondary | ICD-10-CM | POA: Diagnosis not present

## 2024-07-05 DIAGNOSIS — I10 Essential (primary) hypertension: Secondary | ICD-10-CM | POA: Diagnosis not present

## 2024-07-05 DIAGNOSIS — N183 Chronic kidney disease, stage 3 unspecified: Secondary | ICD-10-CM | POA: Diagnosis not present

## 2024-07-05 DIAGNOSIS — Z0001 Encounter for general adult medical examination with abnormal findings: Secondary | ICD-10-CM | POA: Diagnosis not present

## 2024-07-05 DIAGNOSIS — I2510 Atherosclerotic heart disease of native coronary artery without angina pectoris: Secondary | ICD-10-CM | POA: Diagnosis not present

## 2024-07-05 DIAGNOSIS — E782 Mixed hyperlipidemia: Secondary | ICD-10-CM | POA: Diagnosis not present

## 2024-07-05 DIAGNOSIS — E7849 Other hyperlipidemia: Secondary | ICD-10-CM | POA: Diagnosis not present

## 2024-07-05 DIAGNOSIS — R7309 Other abnormal glucose: Secondary | ICD-10-CM | POA: Diagnosis not present

## 2024-07-05 DIAGNOSIS — E6609 Other obesity due to excess calories: Secondary | ICD-10-CM | POA: Diagnosis not present

## 2024-08-31 DIAGNOSIS — B351 Tinea unguium: Secondary | ICD-10-CM | POA: Diagnosis not present

## 2024-08-31 DIAGNOSIS — M79674 Pain in right toe(s): Secondary | ICD-10-CM | POA: Diagnosis not present

## 2024-09-01 ENCOUNTER — Ambulatory Visit: Attending: Cardiology | Admitting: Cardiology

## 2024-09-01 ENCOUNTER — Encounter: Payer: Self-pay | Admitting: Cardiology

## 2024-09-01 VITALS — BP 116/70 | HR 63 | Ht 72.0 in | Wt 223.4 lb

## 2024-09-01 DIAGNOSIS — I2510 Atherosclerotic heart disease of native coronary artery without angina pectoris: Secondary | ICD-10-CM

## 2024-09-01 DIAGNOSIS — E782 Mixed hyperlipidemia: Secondary | ICD-10-CM | POA: Diagnosis not present

## 2024-09-01 DIAGNOSIS — I10 Essential (primary) hypertension: Secondary | ICD-10-CM | POA: Diagnosis not present

## 2024-09-01 MED ORDER — EZETIMIBE 10 MG PO TABS: 10.0000 mg | ORAL_TABLET | Freq: Every day | ORAL | 3 refills | Status: AC

## 2024-09-01 NOTE — Progress Notes (Signed)
 Clinical Summary Mr. Edgell is a 73 y.o.male seen today for follow up of the following medical problems.    1.CAD - admitted 10/2021 with NSTEMI - 10/2021 echo: LVEF 60-65%, no WMAs, grad I dd -10/2021 cath as reported below, PCI attempt complicated by LM dissection, referred for CABG   11/13/2021 LIMA-LAD, RSVG-RI  02/2023 echo: LVEF 60-65%, grade I dd   - no specific exertional chest pains.  - compliant with meds   2.HTN - he is compliatnt with meds       3. Hyperlipidemia 10/2021 TC 46 TG 49 HDL 15 LDL 21 09/2023 TC 149 TG 78 HDL 43 LDL 91 - 06/2024 TC 853 TG 57 HDL 50 LDL 84 - ran out of zetia      4.COPD -  11/2023 PFTs: severe COPD - followed by pulmonary         Past Medical History:  Diagnosis Date   CAD (coronary artery disease)    DOE (dyspnea on exertion)    Hyperlipidemia    Hypertension    S/P CABG (coronary artery bypass graft)    2022     No Known Allergies   Current Outpatient Medications  Medication Sig Dispense Refill   acetaminophen  (TYLENOL ) 500 MG tablet Take 500 mg by mouth daily.     amLODipine  (NORVASC ) 10 MG tablet Take 1 tablet (10 mg total) by mouth daily. 30 tablet 5   aspirin  EC 81 MG tablet Take 81 mg by mouth daily. Swallow whole.     B Complex Vitamins (B COMPLEX-B12) TABS Take 1 tablet by mouth daily.     ezetimibe  (ZETIA ) 10 MG tablet Take 1 tablet (10 mg total) by mouth daily. 30 tablet 6   furosemide  (LASIX ) 20 MG tablet Take 1 tablet (20 mg total) by mouth daily as needed. 90 tablet 1   lisinopril -hydrochlorothiazide  (ZESTORETIC ) 20-12.5 MG tablet      Metoprolol  Tartrate 37.5 MG TABS Take 1 tablet (37.5 mg total) by mouth in the morning and at bedtime. 180 tablet 1   Pediatric Multivitamins-Iron (FLINTSTONES COMPLETE) 10 MG CHEW Chew 1 tablet by mouth daily.     rosuvastatin  (CRESTOR ) 40 MG tablet TAKE 1 TABLET BY MOUTH ONCE DAILY AT  8  PM 90 tablet 1   umeclidinium-vilanterol (ANORO ELLIPTA ) 62.5-25 MCG/ACT  AEPB Inhale 1 puff into the lungs daily. 1 each 11   No current facility-administered medications for this visit.     Past Surgical History:  Procedure Laterality Date   CORONARY ARTERY BYPASS GRAFT N/A 11/12/2021   Procedure: CORONARY ARTERY BYPASS GRAFTING (CABG) X TWO, USING LEFT INTERNAL MAMMARY ARTERY AND LEFT LEG GREATER SAPHENOUS VEIN - OPEN HARVEST;  Surgeon: Shyrl Linnie KIDD, MD;  Location: [facility] OR;  Service: Open Heart Surgery;  Laterality: N/A;   CORONARY BALLOON ANGIOPLASTY N/A 11/12/2021   Procedure: CORONARY BALLOON ANGIOPLASTY;  Surgeon: Anner Alm ORN, MD;  Location: [facility] INVASIVE CV LAB;  Service: Cardiovascular;  Laterality: N/A;   IABP INSERTION N/A 11/12/2021   Procedure: IABP INSERTION;  Surgeon: Anner Alm ORN, MD;  Location: [facility] INVASIVE CV LAB;  Service: Cardiovascular;  Laterality: N/A;   LEFT HEART CATH AND CORONARY ANGIOGRAPHY N/A 11/12/2021   Procedure: LEFT HEART CATH AND CORONARY ANGIOGRAPHY;  Surgeon: Anner Alm ORN, MD;  Location: [facility] INVASIVE CV LAB;  Service: Cardiovascular;  Laterality: N/A;   TEE WITHOUT CARDIOVERSION  11/12/2021   Procedure: TRANSESOPHAGEAL ECHOCARDIOGRAM (TEE);  Surgeon: Shyrl Linnie KIDD, MD;  Location: [facility] OR;  Service: Open Heart Surgery;;     No Known Allergies    Family History  Problem Relation Age of Onset   Heart attack Brother      Social History Mr. Dudek reports that he quit smoking about 2 years ago. His smoking use included cigarettes. He has never been exposed to tobacco smoke. He has never used smokeless tobacco. Mr. Reh reports that he does not currently use alcohol.      Physical Examination Today's Vitals   09/01/24 0905  BP: 116/70  Pulse: 63  SpO2: 97%  Weight: 223 lb 6.4 oz (101.3 kg)  Height: 6' (1.829 m)   Body mass index is 30.3 kg/m.  Gen: resting comfortably, no acute distress HEENT: no scleral icterus, pupils equal round and reactive, no palptable cervical  adenopathy,  CV: RRR, no m/rg, no jvd Resp: Clear to auscultation bilaterally GI: abdomen is soft, non-tender, non-distended, normal bowel sounds, no hepatosplenomegaly MSK: extremities are warm, no edema.  Skin: warm, no rash Neuro:  no focal deficits Psych: appropriate affect   Diagnostic Studies  10/2021 echo 1. Left ventricular ejection fraction, by estimation, is 60 to 65%. The  left ventricle has normal function. The left ventricle has no regional  wall motion abnormalities. There is moderate left ventricular hypertrophy.  Left ventricular diastolic  parameters are consistent with Grade I diastolic dysfunction (impaired  relaxation).   2. Right ventricular systolic function is normal. The right ventricular  size is normal. There is normal pulmonary artery systolic pressure.   3. The mitral valve is normal in structure. No evidence of mitral valve  regurgitation. No evidence of mitral stenosis.   4. The aortic valve is tricuspid. Aortic valve regurgitation is not  visualized. No aortic stenosis is present.   5. The inferior vena cava is normal in size with greater than 50%  respiratory variability, suggesting right atrial pressure of 3 mmHg.    10/2021 cath Ost LM to Prox LAD lesion is 30% stenosed with 80% stenosed side Narjis Mira in Ost Cx.  Post intervention, there is a 50% residual stenosis with distal LM dissection.  Post attempted intervention, the OM side Lonya Johannesen was reduced to 80% residual stenosis.   Ramus-1 lesion is 40% stenosed. - mid 90%, distal mid thrombotic 90%.   Mid Cx to Dist Cx lesion is 95% stenosed. - plan was to attempt PCI   multiple wires were tried - unable to cross lesion   Post intervention, there is a 99% residual stenosis. TIMI 1 flow noted.   Prox RCA to Mid RCA lesion is 75% stenosed. non-dominant vessel   There is mild to moderate left ventricular systolic dysfunction.  The left ventricular ejection fraction is 45-50% by visual estimate.   LV end  diastolic pressure is normal.   There is no aortic valve stenosis.   POST-OPERATIVE DIAGNOSIS:  Severe multivessel disease: Modest disease in the LAD, but heavily tortuous and calcified ramus intermedius as well as likely dominant LCx with severe disease :   Ramus takes a 90 degree turn shortly after that another 90 degree turn and there is a filling defect that appears to be either thrombotic or calcification.  The downstream vessel has pruning with what appears to be possible distal embolization. LCx is an unusual takeoff, there is diffuse moderate 70% disease followed by a hairpin turn where there is apparently an occluded OM 2 Lajada Janes.  There is a 99% stenosis at this lesion that is heavily calcified. RCA appears to be nondominant  heavily diseased small caliber vessel.            Original plan was to attempt PTCA/PCI of the LCx with Aggrastat  infusion overnight and planned attempted PCI of the ramus intermedius on Friday. Attempted PTCA of the circumflex was performed using [place], Choice PT and whisper wires.  The vessel was very difficult to wire and was not able to reach beyond the lesion.  Imaging after wire removal revealed that the vessel was now no longer subtotally occluded but appeared to have total occlusion with TIMI I flow distally.  At this point I chose to abort for further procedures.            On continued to of post wiring angiography, there appears to be dye hang up in the left main into the aortic root concerning for possible dissection.              Aggrastat  bolus had been given but was discontinued.  CVTS consult called for potential emergent surgery.    EF appears to be 45 to 50%.  There appears to be apical inferior and mid to distal lateral-inferolateral hypokinesis. Normal LVEDP.   PATIENT DISPOSITION:  PACU - hemodynamically stable.            Stat echocardiogram called            CVTS consult call with Dr. Shyrl -> likely emergent surgery.  Would  like to consider CABG.            Antiplatelet agents have not stopped.     Assessment and Plan   1.CAD - was not started on ACE/ARB at d/c due to low bp's and AKI, more recently had elevated potassium level. Have not started at this time -no recent symptoms, continue current meds - EKG today shows SR, no acute ischemic changes   2. HTN - bp is at goal, continue current meds   3.HLD - LDL above goal, he has run out of his zetia . Refill zetia , continue crestor  40mg  daily    Dorn PHEBE Ross, M.D

## 2024-09-01 NOTE — Patient Instructions (Signed)
 Medication Instructions:  Your physician recommends that you continue on your current medications as directed. Please refer to the Current Medication list given to you today.  I have refilled your Zetia    *If you need a refill on your cardiac medications before your next appointment, please call your pharmacy*  Lab Work: None   If you have labs (blood work) drawn today and your tests are completely normal, you will receive your results only by: MyChart Message (if you have MyChart) OR A paper copy in the mail If you have any lab test that is abnormal or we need to change your treatment, we will call you to review the results.  Testing/Procedures: None   Follow-Up: At [facility], you and your health needs are our priority.  As part of our continuing mission to provide you with exceptional heart care, our providers are all part of one team.  This team includes your primary Cardiologist (physician) and Advanced Practice Providers or APPs (Physician Assistants and Nurse Practitioners) who all work together to provide you with the care you need, when you need it.  Your next appointment:   6 month(s)  Provider:   Dorn Ross, MD or Almarie Crate, NP    We recommend signing up for the patient portal called MyChart.  Sign up information is provided on this After Visit Summary.  MyChart is used to connect with patients for Virtual Visits (Telemedicine).  Patients are able to view lab/test results, encounter notes, upcoming appointments, etc.  Non-urgent messages can be sent to your provider as well.   To learn more about what you can do with MyChart, go to [email or web address].   Other Instructions Thank you for choosing [facility]!

## 2024-10-19 DIAGNOSIS — N183 Chronic kidney disease, stage 3 unspecified: Secondary | ICD-10-CM | POA: Diagnosis not present

## 2024-10-19 DIAGNOSIS — R7309 Other abnormal glucose: Secondary | ICD-10-CM | POA: Diagnosis not present

## 2024-10-19 DIAGNOSIS — Z0000 Encounter for general adult medical examination without abnormal findings: Secondary | ICD-10-CM | POA: Diagnosis not present

## 2024-10-19 DIAGNOSIS — E785 Hyperlipidemia, unspecified: Secondary | ICD-10-CM | POA: Diagnosis not present

## 2024-10-19 DIAGNOSIS — I10 Essential (primary) hypertension: Secondary | ICD-10-CM | POA: Diagnosis not present

## 2024-10-19 DIAGNOSIS — I2510 Atherosclerotic heart disease of native coronary artery without angina pectoris: Secondary | ICD-10-CM | POA: Diagnosis not present

## 2024-10-19 DIAGNOSIS — Z23 Encounter for immunization: Secondary | ICD-10-CM | POA: Diagnosis not present

## 2024-11-09 DIAGNOSIS — M79675 Pain in left toe(s): Secondary | ICD-10-CM | POA: Diagnosis not present

## 2024-11-09 DIAGNOSIS — B351 Tinea unguium: Secondary | ICD-10-CM | POA: Diagnosis not present

## 2025-03-07 ENCOUNTER — Ambulatory Visit: Admitting: Cardiology
